# Patient Record
Sex: Female | Born: 1960 | Race: White | Hispanic: No | Marital: Married | Smoking: Former smoker
Health system: Southern US, Community
[De-identification: ages and names within clinical notes are randomized; demographics above are authoritative.]

## PROBLEM LIST (undated history)

## (undated) DIAGNOSIS — K621 Rectal polyp: Secondary | ICD-10-CM

## (undated) DIAGNOSIS — G43909 Migraine, unspecified, not intractable, without status migrainosus: Secondary | ICD-10-CM

## (undated) DIAGNOSIS — F329 Major depressive disorder, single episode, unspecified: Secondary | ICD-10-CM

## (undated) DIAGNOSIS — F32A Depression, unspecified: Secondary | ICD-10-CM

## (undated) DIAGNOSIS — I429 Cardiomyopathy, unspecified: Secondary | ICD-10-CM

## (undated) DIAGNOSIS — G2581 Restless legs syndrome: Secondary | ICD-10-CM

## (undated) DIAGNOSIS — F410 Panic disorder [episodic paroxysmal anxiety] without agoraphobia: Secondary | ICD-10-CM

## (undated) DIAGNOSIS — K589 Irritable bowel syndrome without diarrhea: Secondary | ICD-10-CM

## (undated) DIAGNOSIS — I341 Nonrheumatic mitral (valve) prolapse: Secondary | ICD-10-CM

## (undated) DIAGNOSIS — E785 Hyperlipidemia, unspecified: Secondary | ICD-10-CM

## (undated) DIAGNOSIS — F419 Anxiety disorder, unspecified: Secondary | ICD-10-CM

## (undated) HISTORY — DX: Restless legs syndrome: G25.81

## (undated) HISTORY — DX: Depression, unspecified: F32.A

## (undated) HISTORY — DX: Major depressive disorder, single episode, unspecified: F32.9

## (undated) HISTORY — DX: Hyperlipidemia, unspecified: E78.5

## (undated) HISTORY — DX: Migraine, unspecified, not intractable, without status migrainosus: G43.909

## (undated) HISTORY — DX: Irritable bowel syndrome, unspecified: K58.9

## (undated) HISTORY — DX: Panic disorder (episodic paroxysmal anxiety): F41.0

## (undated) HISTORY — DX: Cardiomyopathy, unspecified: I42.9

## (undated) HISTORY — DX: Rectal polyp: K62.1

## (undated) HISTORY — DX: Anxiety disorder, unspecified: F41.9

## (undated) HISTORY — DX: Nonrheumatic mitral (valve) prolapse: I34.1

## (undated) HISTORY — PX: RECTAL POLYPECTOMY: SHX2309

---

## 1985-10-26 HISTORY — PX: OTHER SURGICAL HISTORY: SHX169

## 1999-12-16 ENCOUNTER — Other Ambulatory Visit: Admission: RE | Admit: 1999-12-16 | Discharge: 1999-12-16 | Payer: Self-pay | Admitting: Family Medicine

## 2000-04-15 ENCOUNTER — Encounter: Payer: Self-pay | Admitting: Family Medicine

## 2000-04-15 ENCOUNTER — Encounter: Admission: RE | Admit: 2000-04-15 | Discharge: 2000-04-15 | Payer: Self-pay | Admitting: Family Medicine

## 2001-07-05 ENCOUNTER — Other Ambulatory Visit: Admission: RE | Admit: 2001-07-05 | Discharge: 2001-07-05 | Payer: Self-pay | Admitting: Family Medicine

## 2001-10-10 ENCOUNTER — Encounter: Payer: Self-pay | Admitting: Family Medicine

## 2001-10-10 ENCOUNTER — Encounter: Admission: RE | Admit: 2001-10-10 | Discharge: 2001-10-10 | Payer: Self-pay | Admitting: Family Medicine

## 2002-06-27 ENCOUNTER — Other Ambulatory Visit: Admission: RE | Admit: 2002-06-27 | Discharge: 2002-06-27 | Payer: Self-pay | Admitting: Family Medicine

## 2002-10-11 ENCOUNTER — Encounter: Admission: RE | Admit: 2002-10-11 | Discharge: 2002-10-11 | Payer: Self-pay | Admitting: Family Medicine

## 2002-10-11 ENCOUNTER — Encounter: Payer: Self-pay | Admitting: Family Medicine

## 2003-07-03 ENCOUNTER — Other Ambulatory Visit: Admission: RE | Admit: 2003-07-03 | Discharge: 2003-07-03 | Payer: Self-pay | Admitting: Family Medicine

## 2003-07-06 ENCOUNTER — Encounter: Admission: RE | Admit: 2003-07-06 | Discharge: 2003-07-06 | Payer: Self-pay | Admitting: Family Medicine

## 2003-07-06 ENCOUNTER — Encounter: Payer: Self-pay | Admitting: Family Medicine

## 2003-10-16 ENCOUNTER — Encounter: Admission: RE | Admit: 2003-10-16 | Discharge: 2003-10-16 | Payer: Self-pay | Admitting: Family Medicine

## 2003-12-29 ENCOUNTER — Emergency Department (HOSPITAL_COMMUNITY): Admission: EM | Admit: 2003-12-29 | Discharge: 2003-12-29 | Payer: Self-pay | Admitting: Emergency Medicine

## 2004-07-15 ENCOUNTER — Other Ambulatory Visit: Admission: RE | Admit: 2004-07-15 | Discharge: 2004-07-15 | Payer: Self-pay | Admitting: Family Medicine

## 2004-10-07 ENCOUNTER — Ambulatory Visit: Payer: Self-pay | Admitting: Family Medicine

## 2004-10-24 ENCOUNTER — Encounter: Admission: RE | Admit: 2004-10-24 | Discharge: 2004-10-24 | Payer: Self-pay | Admitting: Family Medicine

## 2005-01-13 ENCOUNTER — Ambulatory Visit: Payer: Self-pay | Admitting: Family Medicine

## 2005-02-10 ENCOUNTER — Ambulatory Visit: Payer: Self-pay | Admitting: Family Medicine

## 2005-02-25 ENCOUNTER — Ambulatory Visit: Payer: Self-pay | Admitting: Family Medicine

## 2005-07-20 ENCOUNTER — Ambulatory Visit: Payer: Self-pay | Admitting: Family Medicine

## 2005-07-27 ENCOUNTER — Encounter: Payer: Self-pay | Admitting: Family Medicine

## 2005-07-27 ENCOUNTER — Other Ambulatory Visit: Admission: RE | Admit: 2005-07-27 | Discharge: 2005-07-27 | Payer: Self-pay | Admitting: Family Medicine

## 2005-07-27 ENCOUNTER — Ambulatory Visit: Payer: Self-pay | Admitting: Family Medicine

## 2005-08-27 ENCOUNTER — Ambulatory Visit: Payer: Self-pay | Admitting: Family Medicine

## 2005-11-16 ENCOUNTER — Encounter: Admission: RE | Admit: 2005-11-16 | Discharge: 2005-11-16 | Payer: Self-pay | Admitting: Family Medicine

## 2006-02-08 ENCOUNTER — Ambulatory Visit: Payer: Self-pay | Admitting: Family Medicine

## 2006-08-02 ENCOUNTER — Ambulatory Visit: Payer: Self-pay | Admitting: Family Medicine

## 2006-08-09 ENCOUNTER — Other Ambulatory Visit: Admission: RE | Admit: 2006-08-09 | Discharge: 2006-08-09 | Payer: Self-pay | Admitting: Family Medicine

## 2006-08-09 ENCOUNTER — Ambulatory Visit: Payer: Self-pay | Admitting: Family Medicine

## 2006-08-09 ENCOUNTER — Encounter: Payer: Self-pay | Admitting: Family Medicine

## 2006-11-29 ENCOUNTER — Encounter: Admission: RE | Admit: 2006-11-29 | Discharge: 2006-11-29 | Payer: Self-pay | Admitting: Family Medicine

## 2006-12-13 ENCOUNTER — Encounter: Admission: RE | Admit: 2006-12-13 | Discharge: 2006-12-13 | Payer: Self-pay | Admitting: Family Medicine

## 2007-06-15 DIAGNOSIS — F329 Major depressive disorder, single episode, unspecified: Secondary | ICD-10-CM

## 2007-06-15 DIAGNOSIS — F32A Depression, unspecified: Secondary | ICD-10-CM | POA: Insufficient documentation

## 2007-08-08 ENCOUNTER — Ambulatory Visit: Payer: Self-pay | Admitting: Family Medicine

## 2007-08-08 LAB — CONVERTED CEMR LAB
Albumin: 3.9 g/dL (ref 3.5–5.2)
Alkaline Phosphatase: 37 units/L — ABNORMAL LOW (ref 39–117)
BUN: 11 mg/dL (ref 6–23)
Basophils Absolute: 0.1 10*3/uL (ref 0.0–0.1)
Bilirubin Urine: NEGATIVE
Blood in Urine, dipstick: NEGATIVE
Cholesterol: 215 mg/dL (ref 0–200)
Creatinine, Ser: 0.6 mg/dL (ref 0.4–1.2)
Direct LDL: 146.6 mg/dL
Eosinophils Absolute: 0.2 10*3/uL (ref 0.0–0.6)
GFR calc non Af Amer: 114 mL/min
Glucose, Urine, Semiquant: NEGATIVE
Hemoglobin: 12.8 g/dL (ref 12.0–15.0)
Ketones, urine, test strip: NEGATIVE
MCHC: 34.7 g/dL (ref 30.0–36.0)
MCV: 95.5 fL (ref 78.0–100.0)
Monocytes Absolute: 0.4 10*3/uL (ref 0.2–0.7)
Monocytes Relative: 6.7 % (ref 3.0–11.0)
Potassium: 4.2 meq/L (ref 3.5–5.1)
Protein, U semiquant: NEGATIVE
RBC: 3.86 M/uL — ABNORMAL LOW (ref 3.87–5.11)
RDW: 13.6 % (ref 11.5–14.6)
Specific Gravity, Urine: 1.02
TSH: 2.13 microintl units/mL (ref 0.35–5.50)
Total Bilirubin: 0.6 mg/dL (ref 0.3–1.2)
Total CHOL/HDL Ratio: 4.7
Triglycerides: 97 mg/dL (ref 0–149)
pH: 7

## 2007-08-15 ENCOUNTER — Encounter: Payer: Self-pay | Admitting: Family Medicine

## 2007-08-15 ENCOUNTER — Ambulatory Visit: Payer: Self-pay | Admitting: Family Medicine

## 2007-08-15 ENCOUNTER — Other Ambulatory Visit: Admission: RE | Admit: 2007-08-15 | Discharge: 2007-08-15 | Payer: Self-pay | Admitting: Family Medicine

## 2007-08-15 DIAGNOSIS — L509 Urticaria, unspecified: Secondary | ICD-10-CM | POA: Insufficient documentation

## 2007-11-25 ENCOUNTER — Telehealth: Payer: Self-pay | Admitting: Family Medicine

## 2007-12-27 ENCOUNTER — Encounter: Admission: RE | Admit: 2007-12-27 | Discharge: 2007-12-27 | Payer: Self-pay | Admitting: Family Medicine

## 2008-01-19 ENCOUNTER — Ambulatory Visit: Payer: Self-pay | Admitting: Family Medicine

## 2008-01-19 DIAGNOSIS — M549 Dorsalgia, unspecified: Secondary | ICD-10-CM | POA: Insufficient documentation

## 2008-08-02 ENCOUNTER — Encounter: Payer: Self-pay | Admitting: Family Medicine

## 2008-08-06 ENCOUNTER — Ambulatory Visit: Payer: Self-pay | Admitting: Family Medicine

## 2008-08-06 LAB — CONVERTED CEMR LAB
Albumin: 3.6 g/dL (ref 3.5–5.2)
Alkaline Phosphatase: 44 units/L (ref 39–117)
Bilirubin Urine: NEGATIVE
Bilirubin, Direct: 0.1 mg/dL (ref 0.0–0.3)
Calcium: 9.1 mg/dL (ref 8.4–10.5)
Cholesterol: 185 mg/dL (ref 0–200)
Eosinophils Absolute: 0.2 10*3/uL (ref 0.0–0.7)
GFR calc Af Amer: 98 mL/min
GFR calc non Af Amer: 81 mL/min
Glucose, Bld: 95 mg/dL (ref 70–99)
Glucose, Urine, Semiquant: NEGATIVE
HCT: 36 % (ref 36.0–46.0)
HDL: 37.1 mg/dL — ABNORMAL LOW (ref >39.0)
Hemoglobin: 12.4 g/dL (ref 12.0–15.0)
MCHC: 34.5 g/dL (ref 30.0–36.0)
MCV: 96.7 fL (ref 78.0–100.0)
Monocytes Absolute: 0.3 10*3/uL (ref 0.1–1.0)
Monocytes Relative: 6.5 % (ref 3.0–12.0)
Neutro Abs: 3.1 10*3/uL (ref 1.4–7.7)
Nitrite: NEGATIVE
Platelets: 161 10*3/uL (ref 150–400)
Potassium: 5.3 meq/L — ABNORMAL HIGH (ref 3.5–5.1)
RDW: 13.2 % (ref 11.5–14.6)
Sodium: 144 meq/L (ref 135–145)
Specific Gravity, Urine: 1.025
Total Protein: 6.9 g/dL (ref 6.0–8.3)
Triglycerides: 106 mg/dL (ref 0–149)
pH: 5.5

## 2008-08-13 ENCOUNTER — Other Ambulatory Visit: Admission: RE | Admit: 2008-08-13 | Discharge: 2008-08-13 | Payer: Self-pay | Admitting: Family Medicine

## 2008-08-13 ENCOUNTER — Encounter: Payer: Self-pay | Admitting: Family Medicine

## 2008-08-13 ENCOUNTER — Ambulatory Visit: Payer: Self-pay | Admitting: Family Medicine

## 2008-08-13 DIAGNOSIS — F17200 Nicotine dependence, unspecified, uncomplicated: Secondary | ICD-10-CM | POA: Insufficient documentation

## 2008-12-27 ENCOUNTER — Encounter: Admission: RE | Admit: 2008-12-27 | Discharge: 2008-12-27 | Payer: Self-pay | Admitting: Family Medicine

## 2009-08-19 ENCOUNTER — Ambulatory Visit: Payer: Self-pay | Admitting: Family Medicine

## 2009-08-19 LAB — CONVERTED CEMR LAB
BUN: 17 mg/dL (ref 6–23)
Basophils Absolute: 0 10*3/uL (ref 0.0–0.1)
Basophils Relative: 0.3 % (ref 0.0–3.0)
Bilirubin Urine: NEGATIVE
Bilirubin, Direct: 0 mg/dL (ref 0.0–0.3)
CO2: 29 meq/L (ref 19–32)
Calcium: 9.3 mg/dL (ref 8.4–10.5)
Chloride: 107 meq/L (ref 96–112)
Cholesterol: 254 mg/dL — ABNORMAL HIGH (ref 0–200)
Creatinine, Ser: 0.7 mg/dL (ref 0.4–1.2)
Direct LDL: 173.4 mg/dL
Eosinophils Absolute: 0.2 10*3/uL (ref 0.0–0.7)
Glucose, Bld: 93 mg/dL (ref 70–99)
Glucose, Urine, Semiquant: NEGATIVE
Ketones, urine, test strip: NEGATIVE
MCHC: 34.6 g/dL (ref 30.0–36.0)
MCV: 97.6 fL (ref 78.0–100.0)
Monocytes Absolute: 0.4 10*3/uL (ref 0.1–1.0)
Neutrophils Relative %: 63.2 % (ref 43.0–77.0)
Platelets: 185 10*3/uL (ref 150.0–400.0)
RDW: 13.6 % (ref 11.5–14.6)
Total Bilirubin: 0.9 mg/dL (ref 0.3–1.2)
Total Protein: 7.6 g/dL (ref 6.0–8.3)
Triglycerides: 67 mg/dL (ref 0.0–149.0)
pH: 7.5

## 2009-08-26 ENCOUNTER — Other Ambulatory Visit: Admission: RE | Admit: 2009-08-26 | Discharge: 2009-08-26 | Payer: Self-pay | Admitting: Family Medicine

## 2009-08-26 ENCOUNTER — Encounter: Payer: Self-pay | Admitting: Family Medicine

## 2009-08-26 ENCOUNTER — Ambulatory Visit: Payer: Self-pay | Admitting: Family Medicine

## 2009-09-16 ENCOUNTER — Telehealth: Payer: Self-pay | Admitting: Family Medicine

## 2009-10-28 ENCOUNTER — Encounter: Payer: Self-pay | Admitting: Family Medicine

## 2009-12-20 ENCOUNTER — Ambulatory Visit: Payer: Self-pay | Admitting: Family Medicine

## 2009-12-20 DIAGNOSIS — T148XXA Other injury of unspecified body region, initial encounter: Secondary | ICD-10-CM | POA: Insufficient documentation

## 2010-01-20 ENCOUNTER — Encounter: Admission: RE | Admit: 2010-01-20 | Discharge: 2010-01-20 | Payer: Self-pay | Admitting: Family Medicine

## 2010-09-01 ENCOUNTER — Ambulatory Visit: Payer: Self-pay | Admitting: Family Medicine

## 2010-09-01 LAB — CONVERTED CEMR LAB
BUN: 14 mg/dL (ref 6–23)
Basophils Absolute: 0 10*3/uL (ref 0.0–0.1)
Bilirubin Urine: NEGATIVE
Chloride: 107 meq/L (ref 96–112)
Direct LDL: 174.3 mg/dL
Eosinophils Absolute: 0.2 10*3/uL (ref 0.0–0.7)
Glucose, Bld: 85 mg/dL (ref 70–99)
Glucose, Urine, Semiquant: NEGATIVE
HCT: 36.6 % (ref 36.0–46.0)
HDL: 56.7 mg/dL (ref >39.00)
Lymphs Abs: 1.7 10*3/uL (ref 0.7–4.0)
MCV: 97.2 fL (ref 78.0–100.0)
Monocytes Absolute: 0.4 10*3/uL (ref 0.1–1.0)
Platelets: 197 10*3/uL (ref 150.0–400.0)
Potassium: 4.9 meq/L (ref 3.5–5.1)
RDW: 14.5 % (ref 11.5–14.6)
Specific Gravity, Urine: 1.015
TSH: 1.38 microintl units/mL (ref 0.35–5.50)
Total Bilirubin: 0.6 mg/dL (ref 0.3–1.2)
WBC Urine, dipstick: NEGATIVE
pH: 6

## 2010-09-08 ENCOUNTER — Ambulatory Visit: Payer: Self-pay | Admitting: Family Medicine

## 2010-09-08 ENCOUNTER — Encounter: Payer: Self-pay | Admitting: Family Medicine

## 2010-09-08 ENCOUNTER — Other Ambulatory Visit: Admission: RE | Admit: 2010-09-08 | Discharge: 2010-09-08 | Payer: Self-pay | Admitting: Family Medicine

## 2010-09-29 ENCOUNTER — Telehealth: Payer: Self-pay | Admitting: Family Medicine

## 2010-10-03 ENCOUNTER — Telehealth: Payer: Self-pay | Admitting: Family Medicine

## 2010-10-13 ENCOUNTER — Ambulatory Visit: Payer: Self-pay | Admitting: Family Medicine

## 2010-10-13 DIAGNOSIS — F410 Panic disorder [episodic paroxysmal anxiety] without agoraphobia: Secondary | ICD-10-CM | POA: Insufficient documentation

## 2010-11-16 ENCOUNTER — Encounter: Payer: Self-pay | Admitting: Family Medicine

## 2010-11-24 ENCOUNTER — Ambulatory Visit
Admission: RE | Admit: 2010-11-24 | Discharge: 2010-11-24 | Payer: Self-pay | Source: Home / Self Care | Attending: Family Medicine | Admitting: Family Medicine

## 2010-11-24 DIAGNOSIS — J45901 Unspecified asthma with (acute) exacerbation: Secondary | ICD-10-CM | POA: Insufficient documentation

## 2010-11-27 NOTE — Progress Notes (Signed)
Summary: REQUEST FOR RETURN CALL?  Phone Note Call from Patient   Caller: Patient Summary of Call: Pt called back to speak with Fleet Contras, CMA..... Adv she was returning call - can be reached at   [phone].  Initial call taken by: Debbra Riding,  October 03, 2010 11:42 AM  Follow-up for Phone Call        Phone Call Completed Follow-up by: Kern Reap CMA Duncan Dull),  October 03, 2010 12:33 PM

## 2010-11-27 NOTE — Miscellaneous (Signed)
Summary: Physical Therapy Progress Note/[facility]  Physical Therapy Progress Note/[facility]   Imported By: Maryln Gottron 11/05/2009 11:53:28  _____________________________________________________________________  External Attachment:    Type:   Image     Comment:   External Document

## 2010-11-27 NOTE — Progress Notes (Signed)
Summary: REQUEST FOR REFILL  Phone Note Call from Patient   Caller: Patient   [phone] Summary of Call: Pt adv that she doesn't want to take the med: Lorazepam any longer.... adv it caused her to have nightmares, and altered her mood in a bad way...Marland KitchenMarland Kitchen Pt would like to have a Rx for xanax sent to:  [facility] [address]  Initial call taken by: Debbra Riding,  September 29, 2010 10:25 AM  Follow-up for Phone Call        [facility] please call [facility]........Marland Kitchen Xanax now has a black box warning....... so you can l use small amounts for a maximum of two weeks.........Marland Kitchen we need to talk about other options Follow-up by: Roderick Pee MD,  September 29, 2010 10:35 AM  Additional Follow-up for Phone Call Additional follow up Details #1::        left message on machine for patient  Additional Follow-up by: Kern Reap CMA Duncan Dull),  September 29, 2010 2:26 PM

## 2010-11-27 NOTE — Assessment & Plan Note (Signed)
Summary: neck check discloration/njr   Vital Signs:  Patient profile:   50 year old female Menstrual status:  irregular Temp:     98.9 degrees F oral BP sitting:   110 / 60  (left arm) Cuff size:   regular  Vitals Entered By: Sid Falcon LPN (December 20, 2009 3:44 PM) CC: spot on neck X 3 days   History of Present Illness: Discoloration right upper chest wall anteriorly which she noticed Tuesday morning when she woke up. No injury. No associated pain. No other bruises noted.  No progression of bruising since first noted. No regular aspirin use. No new lesions since then.  No gum bleeding or any other type of abnl bleeding.  Allergies (verified): No Known Drug Allergies  Past History:  Past Medical History: Last updated: 08/15/2007 Hives Depression IBS panic attacks PMH reviewed for relevance  Review of Systems  The patient denies anorexia, fever, weight loss, hemoptysis, and hematuria.    Physical Exam  General:  Well-developed,well-nourished,in no acute distress; alert,appropriate and cooperative throughout examination Mouth:  Oral mucosa and oropharynx without lesions or exudates.  Teeth in good repair. Chest Wall:  right upper chest wall reveals 8 x 10 mm ecchymosis which is nontender. No other areas of ecchymosis noted Lungs:  Normal respiratory effort, chest expands symmetrically. Lungs are clear to auscultation, no crackles or wheezes. Heart:  Normal rate and regular rhythm. S1 and S2 normal without gallop, murmur, click, rub or other extra sounds. Skin:  scattered superficial telangiectasias ant chest wall.   Impression & Recommendations:  Problem # 1:  BRUISE (ICD-924.9) Nontraumatic but singular.  Reassurance given but instructed pt she would need CBC if any other nontraumatic bruises noted.  Complete Medication List: 1)  Prednisone 20 Mg Tabs (Prednisone) .... As needed 2)  Triamcinolone Acetonide 0.1 % Crea (Triamcinolone acetonide) .... Uad 3)   Adult Aspirin Ec Low Strength 81 Mg Tbec (Aspirin) 4)  Hydroxyzine Hcl 10 Mg Tabs (Hydroxyzine hcl) .... As needed 5)  Alprazolam 0.5 Mg Tabs (Alprazolam) .Marland Kitchen.. 1 tab once daily 6)  Prozac 40 Mg Caps (Fluoxetine hcl) .Marland Kitchen.. 1 tab @ bedtime 7)  Diclofenac Sodium 75 Mg Tbec (Diclofenac sodium) .... Take one tab two times a day 8)  Flexeril 10 Mg Tabs (Cyclobenzaprine hcl) .Marland Kitchen.. 1 tab @ bedtime 9)  Vicodin Es 7.5-750 Mg Tabs (Hydrocodone-acetaminophen) .Marland Kitchen.. 1 tab @ bedtime

## 2010-11-27 NOTE — Assessment & Plan Note (Signed)
Summary: cpx/pap/njr also flu shot/njr   Vital Signs:  Patient profile:   50 year old female Menstrual status:  irregular LMP:     08/30/2010 Height:      67.25 inches Weight:      141 pounds BMI:     22.00 Temp:     98.4 degrees F oral BP sitting:   112 / 80  (left arm) Cuff size:   regular  Vitals Entered By: Kern Reap CMA Duncan Dull) (September 08, 2010 10:47 AM) CC: cpx LMP (date): 08/30/2010     Enter LMP: 08/30/2010 Last PAP Result NEGATIVE FOR INTRAEPITHELIAL LESIONS OR MALIGNANCY.   CC:  cpx.  History of Present Illness:  Shaneque is a delightful 50 year old, married female........ unfortunately she continues to smoke about 6 cigarettes a day........ who comes in today for general physical examination because of underlying tobacco abuse, chronic back pain, anxiety, and depression.  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  She is able to get by with Motrin, 600 mg 3 times a day with food, and 10 mg of Flexeril once weekly, when her pain and muscle spasm or severe.  She's been through all the modalities, including physical therapy.  We discussed epidural steroid injections.  However, she states her insurance will not cover it.  She takes Prozac 40 mg daily would like to decrease the dose to 20.  She takes alprazolam .5 daily p.r.n..  I suggested she take a low dose Ativan once or twice daily as this would be better than smoking.  She also uses cortisone cream p.r.n. for eczema and hydroxyzine 10 mg p.r.n. for itching.  She gets routine eye care, dental care, BSE monthly, and a mammography, LMP November 5 to November, the 11th normal.  Beginning menopause with irregular periods and hot flashes.  Tetanus 2002 seasonal flu shot today  Allergies (verified): No Known Drug Allergies  Past History:  Past medical, surgical, family and social histories (including risk factors)  reviewed, and no changes noted (except as noted below).  Past Medical History: Reviewed history from 08/15/2007 and no changes required. Hives Depression IBS panic attacks  Past Surgical History: Reviewed history from 06/15/2007 and no changes required. CB x1  Family History: Reviewed history and no changes required.  Social History: Reviewed history from 01/19/2008 and no changes required. Occupation:food service Married Current Smoker Alcohol use-yes  Review of Systems      See HPI       Flu Vaccine Consent Questions     Do you have a history of severe allergic reactions to this vaccine? no    Any prior history of allergic reactions to egg and/or gelatin? no    Do you have a sensitivity to the preservative Thimersol? no    Do you have a past history of Guillan-Barre Syndrome? no    Do you currently have an acute febrile illness? no    Have you ever had a severe reaction to latex? no    Vaccine information given and explained to patient? yes    Are you currently pregnant? no    Lot Number:[id number]   Exp Date:04/25/2011   Site Given  Left Deltoid IM   Physical Exam  General:  Well-developed,well-nourished,in no acute distress; alert,appropriate and cooperative throughout examination Head:  Normocephalic and atraumatic without obvious abnormalities. No apparent alopecia or balding. Eyes:  No corneal or  conjunctival inflammation noted. EOMI. Perrla. Funduscopic exam benign, without hemorrhages, exudates or papilledema. Vision grossly normal. Ears:  External ear exam shows no significant lesions or deformities.  Otoscopic examination reveals clear canals, tympanic membranes are intact bilaterally without bulging, retraction, inflammation or discharge. Hearing is grossly normal bilaterally. Nose:  External nasal examination shows no deformity or inflammation. Nasal mucosa are pink and moist without lesions or exudates. Mouth:  Oral mucosa and oropharynx without lesions or  exudates.  Teeth in good repair. Neck:  No deformities, masses, or tenderness noted. Chest Wall:  No deformities, masses, or tenderness noted. Breasts:  left breast normal right breast shows an area of thickening 12 to 9 o'clock position.  Previously noted Lungs:  Normal respiratory effort, chest expands symmetrically. Lungs are clear to auscultation, no crackles or wheezes. Heart:  Normal rate and regular rhythm. S1 and S2 normal without gallop, murmur, click, rub or other extra sounds. Abdomen:  Bowel sounds positive,abdomen soft and non-tender without masses, organomegaly or hernias noted. Rectal:  No external abnormalities noted. Normal sphincter tone. No rectal masses or tenderness. Genitalia:  Pelvic Exam:        External: normal female genitalia without lesions or masses        Vagina: normal without lesions or masses        Cervix: normal without lesions or masses        Adnexa: normal bimanual exam without masses or fullness        Uterus: normal by palpation        Pap smear: performed Msk:  No deformity or scoliosis noted of thoracic or lumbar spine.   Pulses:  R and L carotid,radial,femoral,dorsalis pedis and posterior tibial pulses are full and equal bilaterally Extremities:  No clubbing, cyanosis, edema, or deformity noted with normal full range of motion of all joints.   Neurologic:  No cranial nerve deficits noted. Station and gait are normal. Plantar reflexes are down-going bilaterally. DTRs are symmetrical throughout. Sensory, motor and coordinative functions appear intact. Skin:  Intact without suspicious lesions or rashes Cervical Nodes:  No lymphadenopathy noted Axillary Nodes:  No palpable lymphadenopathy Inguinal Nodes:  No significant adenopathy Psych:  Cognition and judgment appear intact. Alert and cooperative with normal attention span and concentration. No apparent delusions, illusions, hallucinations   Impression & Recommendations:  Problem # 1:  TOBACCO ABUSE  (ICD-305.1) Assessment Unchanged  Orders: EKG w/ Interpretation (93000)  Problem # 2:  BACK PAIN (ICD-724.5) Assessment: Unchanged  The following medications were removed from the medication list:    Diclofenac Sodium 75 Mg Tbec (Diclofenac sodium) .Marland Kitchen... Take one tab two times a day    Vicodin Es 7.5-750 Mg Tabs (Hydrocodone-acetaminophen) .Marland Kitchen... 1 tab @ bedtime Her updated medication list for this problem includes:    Adult Aspirin Ec Low Strength 81 Mg Tbec (Aspirin)    Flexeril 10 Mg Tabs (Cyclobenzaprine hcl) .Marland Kitchen... 1 tab @ bedtime    Vicodin Es 7.5-750 Mg Tabs (Hydrocodone-acetaminophen) .Marland Kitchen... 1 tab @ bedtime as needed pain  Problem # 3:  PHYSICAL EXAMINATION (ICD-V70.0) Assessment: Unchanged  Orders: EKG w/ Interpretation (93000)  Problem # 4:  DEPRESSION (ICD-311) Assessment: Unchanged  The following medications were removed from the medication list:    Alprazolam 0.5 Mg Tabs (Alprazolam) .Marland Kitchen... 1 tab once daily    Prozac 40 Mg Caps (Fluoxetine hcl) .Marland Kitchen... 1 tab @ bedtime Her updated medication list for this problem includes:    Hydroxyzine Hcl 10 Mg Tabs (Hydroxyzine hcl) .Marland Kitchen... As  needed    Ativan 0.5 Mg Tabs (Lorazepam) .Marland Kitchen... 1/2 by mouth two times a day    Prozac 20 Mg Caps (Fluoxetine hcl) .Marland Kitchen... 1 tab @ bedtime  Complete Medication List: 1)  Prednisone 20 Mg Tabs (Prednisone) .... As needed 2)  Triamcinolone Acetonide 0.1 % Crea (Triamcinolone acetonide) .... Uad 3)  Adult Aspirin Ec Low Strength 81 Mg Tbec (Aspirin) 4)  Hydroxyzine Hcl 10 Mg Tabs (Hydroxyzine hcl) .... As needed 5)  Flexeril 10 Mg Tabs (Cyclobenzaprine hcl) .Marland Kitchen.. 1 tab @ bedtime 6)  Ativan 0.5 Mg Tabs (Lorazepam) .... 1/2 by mouth two times a day 7)  Vicodin Es 7.5-750 Mg Tabs (Hydrocodone-acetaminophen) .Marland Kitchen.. 1 tab @ bedtime as needed pain 8)  Prozac 20 Mg Caps (Fluoxetine hcl) .Marland Kitchen.. 1 tab @ bedtime  Other Orders: Admin 1st Vaccine (16109) Flu Vaccine 80yrs + (60454)  Patient Instructions: 1)   take .25 milligrams of Ativan twice daily and decrease the Prozac to 20 mg at bedtime.  Return in 4 weeks for follow-up. 2)  Decrease y  cigarette consumption by one per week. 3)  Schedule your mammogram. 4)  Take calcium +Vitamin D daily. 5)  Take an Aspirin every day...Marland Kitchen81 mg  Prescriptions: PROZAC 20 MG CAPS (FLUOXETINE HCL) 1 tab @ bedtime  #100 x 3   Entered and Authorized by:   Roderick Pee MD   Signed by:   Roderick Pee MD on 09/08/2010   Method used:   Print then Give to Patient   RxID:   [id number] HYDROXYZINE HCL 10 MG  TABS (HYDROXYZINE HCL) as needed  #100 x 3   Entered and Authorized by:   Roderick Pee MD   Signed by:   Roderick Pee MD on 09/08/2010   Method used:   Print then Give to Patient   RxID:   [id number] TRIAMCINOLONE ACETONIDE 0.1 %  CREA (TRIAMCINOLONE ACETONIDE) uad  #1 lbs jar x prn   Entered and Authorized by:   Roderick Pee MD   Signed by:   Roderick Pee MD on 09/08/2010   Method used:   Print then Give to Patient   RxID:   [id number] FLEXERIL 10 MG TABS (CYCLOBENZAPRINE HCL) 1 tab @ bedtime  #50 x 1   Entered and Authorized by:   Roderick Pee MD   Signed by:   Roderick Pee MD on 09/08/2010   Method used:   Print then Give to Patient   RxID:   [id number] VICODIN ES 7.5-750 MG TABS (HYDROCODONE-ACETAMINOPHEN) 1 tab @ bedtime as needed pain  #50 x 1   Entered and Authorized by:   Roderick Pee MD   Signed by:   Roderick Pee MD on 09/08/2010   Method used:   Print then Give to Patient   RxID:   [id number] ATIVAN 0.5 MG TABS (LORAZEPAM) 1/2 by mouth two times a day  #100 x 3   Entered and Authorized by:   Roderick Pee MD   Signed by:   Roderick Pee MD on 09/08/2010   Method used:   Print then Give to Patient   RxID:   [id number]    Orders Added: 1)  Est. Patient 40-64 years [99396] 2)  Admin 1st Vaccine [90471] 3)  Flu Vaccine 76yrs + [20254] 4)  EKG w/ Interpretation [93000]

## 2010-11-27 NOTE — Assessment & Plan Note (Signed)
Summary: follow up meds - rv   Vital Signs:  Patient profile:   50 year old female Menstrual status:  irregular Weight:      143 pounds Temp:     98.3 degrees F oral BP sitting:   120 / 80  (left arm) Cuff size:   regular  Vitals Entered By: Kern Reap CMA Duncan Dull) (October 13, 2010 10:53 AM) CC: follow-up visit   CC:  follow-up visit.  History of Present Illness: Felicia Good is a 50 year old, married female smoker, who comes in today for evaluation of anxiety attacks.  Her anxiety attacks seem to be getting worse.  She tried increasing her Prozac however, it didn't seem to work.  In the past.  She is taking Xanax however, I encouraged her not to take Xanax.  She is researched this and would like some Klonopin did also discuss with her taking a different SSRI to see if this was stopped.  Her panic attacks  Allergies: No Known Drug Allergies  Past History:  Past medical, surgical, family and social histories (including risk factors) reviewed for relevance to current acute and chronic problems.  Past Medical History: Reviewed history from 08/15/2007 and no changes required. Hives Depression IBS panic attacks  Past Surgical History: Reviewed history from 06/15/2007 and no changes required. CB x1  Family History: Reviewed history and no changes required.  Social History: Reviewed history from 01/19/2008 and no changes required. Occupation:food service Married Current Smoker Alcohol use-yes  Review of Systems      See HPI  Physical Exam  General:  Well-developed,well-nourished,in no acute distress; alert,appropriate and cooperative throughout examination Psych:  Cognition and judgment appear intact. Alert and cooperative with normal attention span and concentration. No apparent delusions, illusions, hallucinations   Problems:  Medical Problems Added: 1)  Dx of Panic Disorder,no Agoraphobia  (ICD-300.01)  Impression & Recommendations:  Problem # 1:  PANIC  DISORDER,NO AGORAPHOBIA (ICD-300.01) Assessment New  The following medications were removed from the medication list:    Ativan 0.5 Mg Tabs (Lorazepam) .Marland Kitchen... 1/2 by mouth two times a day    Prozac 20 Mg Caps (Fluoxetine hcl) .Marland Kitchen... 1 tab @ bedtime Her updated medication list for this problem includes:    Hydroxyzine Hcl 10 Mg Tabs (Hydroxyzine hcl) .Marland Kitchen... As needed    Celexa 20 Mg Tabs (Citalopram hydrobromide) .Marland Kitchen... 1 tab @ bedtime    Klonopin 0.5 Mg Tabs (Clonazepam) .Marland Kitchen... 1 tab by mouth as needed panic attack  Complete Medication List: 1)  Prednisone 20 Mg Tabs (Prednisone) .... As needed 2)  Triamcinolone Acetonide 0.1 % Crea (Triamcinolone acetonide) .... Uad 3)  Adult Aspirin Ec Low Strength 81 Mg Tbec (Aspirin) 4)  Hydroxyzine Hcl 10 Mg Tabs (Hydroxyzine hcl) .... As needed 5)  Flexeril 10 Mg Tabs (Cyclobenzaprine hcl) .Marland Kitchen.. 1 tab @ bedtime 6)  Vicodin Es 7.5-750 Mg Tabs (Hydrocodone-acetaminophen) .Marland Kitchen.. 1 tab @ bedtime as needed pain 7)  Celexa 20 Mg Tabs (Citalopram hydrobromide) .Marland Kitchen.. 1 tab @ bedtime 8)  Klonopin 0.5 Mg Tabs (Clonazepam) .Marland Kitchen.. 1 tab by mouth as needed panic attack  Patient Instructions: 1)  stopped the Prozac. 2)  Begin Celexa 20 mg a day at bedtime. 3)  If you have a breakthrough panic attack and you can take a  Klonopin.  Return in 6 weeks for follow-up, sooner if any problems Prescriptions: KLONOPIN 0.5 MG TABS (CLONAZEPAM) 1 tab by mouth as needed panic attack  #30 x 3   Entered and Authorized by:  Roderick Pee MD   Signed by:   Roderick Pee MD on 10/13/2010   Method used:   Print then Give to Patient   RxID:   [id number] CELEXA 20 MG TABS (CITALOPRAM HYDROBROMIDE) 1 tab @ bedtime  #100 x 3   Entered and Authorized by:   Roderick Pee MD   Signed by:   Roderick Pee MD on 10/13/2010   Method used:   Print then Give to Patient   RxID:   [id number]    Orders Added: 1)  Est. Patient Level III [66063]

## 2010-12-03 NOTE — Assessment & Plan Note (Signed)
Summary: 6 wk rov/njr   Vital Signs:  Patient profile:   50 year old female Menstrual status:  irregular Weight:      138 pounds Temp:     98.2 degrees F oral BP sitting:   110 / 80  (left arm) Cuff size:   regular  Vitals Entered By: Kern Reap CMA (AAMA) (November 24, 2010 10:03 AM) CC: 6 week follow up   CC:  6 week follow up.  History of Present Illness: Felicia Good is a 50 year old, married female, smoker, who comes in today for follow-up of depression, anxiety, and tobacco abuse, and a cough.  We started Celexa 20 mg daily in December.  She feels somewhat better, but not a lot.  She takes Klonopin .5 p.r.n. for panic attacks.  She cut her cigarette consumption down to one or two cigarettes per day.  She's tried all the chantix tobacco supplements etc. now that seems to help.  She said her cough for the past week or 10 days.  She had some oral prednisone at home and has been taking it feels better.  Still coughing  Allergies: No Known Drug Allergies  Past History:  Past medical, surgical, family and social histories (including risk factors) reviewed for relevance to current acute and chronic problems.  Past Medical History: Reviewed history from 08/15/2007 and no changes required. Hives Depression IBS panic attacks  Past Surgical History: Reviewed history from 06/15/2007 and no changes required. CB x1  Family History: Reviewed history and no changes required.  Social History: Reviewed history from 01/19/2008 and no changes required. Occupation:food service Married Current Smoker Alcohol use-yes  Review of Systems      See HPI  Physical Exam  General:  Well-developed,well-nourished,in no acute distress; alert,appropriate and cooperative throughout examination Chest Wall:  No deformities, masses, or tenderness noted. Lungs:  symmetrical breath sounds, late expiratory wheezing   Problems:  Medical Problems Added: 1)  Dx of Asthma, Acute   (ZOX-096.04)  Impression & Recommendations:  Problem # 1:  PANIC DISORDER,NO AGORAPHOBIA (ICD-300.01) Assessment Improved  The following medications were removed from the medication list:    Celexa 20 Mg Tabs (Citalopram hydrobromide) .Marland Kitchen... 1 tab @ bedtime Her updated medication list for this problem includes:    Hydroxyzine Hcl 10 Mg Tabs (Hydroxyzine hcl) .Marland Kitchen... As needed    Klonopin 0.5 Mg Tabs (Clonazepam) .Marland Kitchen... 1 tab @ bedtime    Celexa 40 Mg Tabs (Citalopram hydrobromide) .Marland Kitchen... 1 tab @ bedtime  Orders: Prescription Created Electronically [phone])  Problem # 2:  TOBACCO ABUSE (ICD-305.1) Assessment: Improved  Orders: Prescription Created Electronically [phone])  Problem # 3:  DEPRESSION (ICD-311) Assessment: Unchanged  The following medications were removed from the medication list:    Celexa 20 Mg Tabs (Citalopram hydrobromide) .Marland Kitchen... 1 tab @ bedtime Her updated medication list for this problem includes:    Hydroxyzine Hcl 10 Mg Tabs (Hydroxyzine hcl) .Marland Kitchen... As needed    Klonopin 0.5 Mg Tabs (Clonazepam) .Marland Kitchen... 1 tab @ bedtime    Celexa 40 Mg Tabs (Citalopram hydrobromide) .Marland Kitchen... 1 tab @ bedtime  Orders: Prescription Created Electronically [phone])  Problem # 4:  ASTHMA, ACUTE (AOZ-308.65) Assessment: New  Her updated medication list for this problem includes:    Prednisone 20 Mg Tabs (Prednisone) .Marland Kitchen... As needed  Orders: Prescription Created Electronically [phone])  Complete Medication List: 1)  Prednisone 20 Mg Tabs (Prednisone) .... As needed 2)  Triamcinolone Acetonide 0.1 % Crea (Triamcinolone acetonide) .... Uad 3)  Adult Aspirin Ec Low  Strength 81 Mg Tbec (Aspirin) 4)  Hydroxyzine Hcl 10 Mg Tabs (Hydroxyzine hcl) .... As needed 5)  Flexeril 10 Mg Tabs (Cyclobenzaprine hcl) .Marland Kitchen.. 1 tab @ bedtime 6)  Vicodin Es 7.5-750 Mg Tabs (Hydrocodone-acetaminophen) .Marland Kitchen.. 1 tab @ bedtime as needed pain 7)  Klonopin 0.5 Mg Tabs (Clonazepam) .Marland Kitchen.. 1 tab @ bedtime 8)  Celexa 40  Mg Tabs (Citalopram hydrobromide) .Marland Kitchen.. 1 tab @ bedtime  Patient Instructions: 1)  drink 30 ounces of water daily. 2)  No smoking at all. 3)  Prednisone one tablet daily, x 3 days, a half x 3 days, then half a tablet Monday, Wednesday, Friday, for a two week taper. 4)  Increase the Celexa to 40 mg a day at bedtime and take a Klonopin every night at bedtime. 5)  Follow-up in 6 weeks Prescriptions: PREDNISONE 20 MG TABS (PREDNISONE) as needed  #50 x 1   Entered and Authorized by:   Roderick Pee MD   Signed by:   Roderick Pee MD on 11/24/2010   Method used:   [facility]  [address] [phone] ([facility]ail[facility]       [address]       [place], [place]  [id number]       Ph: [phone] or [phone]       Fax: [phone]   RxID:   [id number] CELEXA 40 MG TABS (CITALOPRAM HYDROBROMIDE) 1 tab @ bedtime  #100 x 3   Entered and Authorized by:   Roderick Pee MD   Signed by:   Roderick Pee MD on 11/24/2010   Method used:   [facility]        [facility]  [address] [phone]* (retail)       [address]       [place], [place]  [id number]       Ph: [phone] or [phone]       Fax: [phone]   RxID:   [id number] KLONOPIN 0.5 MG TABS (CLONAZEPAM) 1 tab @ bedtime  #100 x 3   Entered and Authorized by:   Roderick Pee MD   Signed by:   Roderick Pee MD on 11/24/2010   Method used:   Print then Give to Patient   RxID:   [id number]    Orders Added: 1)  Prescription Created Electronically [G8553] 2)  Est. Patient Level IV [06269]

## 2011-01-09 ENCOUNTER — Ambulatory Visit: Payer: Self-pay | Admitting: Family Medicine

## 2011-03-03 ENCOUNTER — Other Ambulatory Visit: Payer: Self-pay | Admitting: Family Medicine

## 2011-03-03 DIAGNOSIS — Z1231 Encounter for screening mammogram for malignant neoplasm of breast: Secondary | ICD-10-CM

## 2011-03-30 ENCOUNTER — Ambulatory Visit
Admission: RE | Admit: 2011-03-30 | Discharge: 2011-03-30 | Disposition: A | Discharge status: Home / Self Care | Payer: 59 | Source: Ambulatory Visit | Attending: Family Medicine | Admitting: Family Medicine

## 2011-03-30 DIAGNOSIS — Z1231 Encounter for screening mammogram for malignant neoplasm of breast: Secondary | ICD-10-CM

## 2011-04-02 ENCOUNTER — Other Ambulatory Visit: Payer: Self-pay | Admitting: Family Medicine

## 2011-04-02 DIAGNOSIS — R928 Other abnormal and inconclusive findings on diagnostic imaging of breast: Secondary | ICD-10-CM

## 2011-04-09 ENCOUNTER — Ambulatory Visit
Admission: RE | Admit: 2011-04-09 | Discharge: 2011-04-09 | Disposition: A | Discharge status: Home / Self Care | Payer: 59 | Source: Ambulatory Visit | Attending: Family Medicine | Admitting: Family Medicine

## 2011-04-09 DIAGNOSIS — R928 Other abnormal and inconclusive findings on diagnostic imaging of breast: Secondary | ICD-10-CM

## 2011-05-28 ENCOUNTER — Other Ambulatory Visit: Payer: Self-pay | Admitting: *Deleted

## 2011-05-28 MED ORDER — CLONAZEPAM 0.5 MG PO TABS: 0.5000 mg | ORAL_TABLET | Freq: Every evening | ORAL | Status: DC | PRN

## 2011-09-02 ENCOUNTER — Ambulatory Visit (INDEPENDENT_AMBULATORY_CARE_PROVIDER_SITE_OTHER): Payer: 59 | Admitting: Family Medicine

## 2011-09-02 DIAGNOSIS — Z23 Encounter for immunization: Secondary | ICD-10-CM

## 2011-09-25 ENCOUNTER — Other Ambulatory Visit (INDEPENDENT_AMBULATORY_CARE_PROVIDER_SITE_OTHER): Payer: 59

## 2011-09-25 DIAGNOSIS — Z0000 Encounter for general adult medical examination without abnormal findings: Secondary | ICD-10-CM

## 2011-09-25 LAB — BASIC METABOLIC PANEL
BUN: 13 mg/dL (ref 6–23)
GFR: 105.53 mL/min (ref >60.00)
Potassium: 3.9 mEq/L (ref 3.5–5.1)
Sodium: 140 mEq/L (ref 135–145)

## 2011-09-25 LAB — LDL CHOLESTEROL, DIRECT: Direct LDL: 131.2 mg/dL

## 2011-09-25 LAB — CBC WITH DIFFERENTIAL/PLATELET
Eosinophils Relative: 2.5 % (ref 0.0–5.0)
HCT: 35.5 % — ABNORMAL LOW (ref 36.0–46.0)
Hemoglobin: 12 g/dL (ref 12.0–15.0)
Lymphs Abs: 1.6 10*3/uL (ref 0.7–4.0)
Monocytes Relative: 6.2 % (ref 3.0–12.0)
Platelets: 189 10*3/uL (ref 150.0–400.0)
WBC: 5.4 10*3/uL (ref 4.5–10.5)

## 2011-09-25 LAB — HEPATIC FUNCTION PANEL
ALT: 40 U/L — ABNORMAL HIGH (ref 0–35)
AST: 39 U/L — ABNORMAL HIGH (ref 0–37)
Alkaline Phosphatase: 41 U/L (ref 39–117)
Total Bilirubin: 0.3 mg/dL (ref 0.3–1.2)

## 2011-09-25 LAB — POCT URINALYSIS DIPSTICK
Glucose, UA: NEGATIVE
Nitrite, UA: NEGATIVE
Spec Grav, UA: 1.02
Urobilinogen, UA: 0.2

## 2011-09-25 LAB — LIPID PANEL
Total CHOL/HDL Ratio: 3
VLDL: 10.6 mg/dL (ref 0.0–40.0)

## 2011-09-25 LAB — TSH: TSH: 1.33 u[IU]/mL (ref 0.35–5.50)

## 2011-10-06 ENCOUNTER — Encounter: Payer: Self-pay | Admitting: Family Medicine

## 2011-10-06 ENCOUNTER — Ambulatory Visit (INDEPENDENT_AMBULATORY_CARE_PROVIDER_SITE_OTHER): Payer: 59 | Admitting: Family Medicine

## 2011-10-06 ENCOUNTER — Other Ambulatory Visit (HOSPITAL_COMMUNITY)
Admission: RE | Admit: 2011-10-06 | Discharge: 2011-10-06 | Disposition: A | Discharge status: Home / Self Care | Payer: 59 | Source: Ambulatory Visit | Attending: Family Medicine | Admitting: Family Medicine

## 2011-10-06 DIAGNOSIS — Z01419 Encounter for gynecological examination (general) (routine) without abnormal findings: Secondary | ICD-10-CM | POA: Insufficient documentation

## 2011-10-06 DIAGNOSIS — Z23 Encounter for immunization: Secondary | ICD-10-CM

## 2011-10-06 DIAGNOSIS — F17200 Nicotine dependence, unspecified, uncomplicated: Secondary | ICD-10-CM

## 2011-10-06 DIAGNOSIS — M549 Dorsalgia, unspecified: Secondary | ICD-10-CM

## 2011-10-06 DIAGNOSIS — F329 Major depressive disorder, single episode, unspecified: Secondary | ICD-10-CM

## 2011-10-06 DIAGNOSIS — Z0000 Encounter for general adult medical examination without abnormal findings: Secondary | ICD-10-CM

## 2011-10-06 DIAGNOSIS — L509 Urticaria, unspecified: Secondary | ICD-10-CM

## 2011-10-06 MED ORDER — CITALOPRAM HYDROBROMIDE 40 MG PO TABS: 40.0000 mg | ORAL_TABLET | Freq: Every day | ORAL | Status: DC

## 2011-10-06 MED ORDER — HYDROXYZINE HCL 10 MG PO TABS: 10.0000 mg | ORAL_TABLET | Freq: Three times a day (TID) | ORAL | Status: DC | PRN

## 2011-10-06 MED ORDER — CLONAZEPAM 0.5 MG PO TABS: 0.5000 mg | ORAL_TABLET | Freq: Every evening | ORAL | Status: DC | PRN

## 2011-10-06 MED ORDER — CYCLOBENZAPRINE HCL 10 MG PO TABS: 10.0000 mg | ORAL_TABLET | Freq: Three times a day (TID) | ORAL | Status: DC | PRN

## 2011-10-06 MED ORDER — HYDROCODONE-ACETAMINOPHEN 7.5-750 MG PO TABS: 1.0000 | ORAL_TABLET | Freq: Four times a day (QID) | ORAL | Status: DC | PRN

## 2011-10-06 MED ORDER — TRIAMCINOLONE ACETONIDE 0.1 % EX CREA: 1.0000 "application " | TOPICAL_CREAM | Freq: Two times a day (BID) | CUTANEOUS | Status: DC
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
## 2011-10-06 NOTE — Patient Instructions (Signed)
Continued ear current medications.  We will set you up a consult with Dr. Modesto Charon for complete neurologic evaluation  Stop smoking completely.  Return one year, sooner if any problems.  Remember to do a complete breast exam monthly and come see [place] if you have any question between checkups

## 2011-10-07 ENCOUNTER — Encounter: Payer: Self-pay | Admitting: Neurology

## 2011-11-18 ENCOUNTER — Ambulatory Visit: Payer: 59 | Admitting: Neurology

## 2012-04-18 ENCOUNTER — Ambulatory Visit (INDEPENDENT_AMBULATORY_CARE_PROVIDER_SITE_OTHER): Payer: 59 | Admitting: Family Medicine

## 2012-04-18 ENCOUNTER — Encounter: Payer: Self-pay | Admitting: Family Medicine

## 2012-04-18 VITALS — Temp 97.9°F | Wt 135.0 lb

## 2012-04-18 DIAGNOSIS — L820 Inflamed seborrheic keratosis: Secondary | ICD-10-CM

## 2012-04-18 DIAGNOSIS — J309 Allergic rhinitis, unspecified: Secondary | ICD-10-CM

## 2012-04-18 DIAGNOSIS — Z720 Tobacco use: Secondary | ICD-10-CM

## 2012-04-18 DIAGNOSIS — L989 Disorder of the skin and subcutaneous tissue, unspecified: Secondary | ICD-10-CM

## 2012-04-18 MED ORDER — FLUTICASONE PROPIONATE 50 MCG/ACT NA SUSP: NASAL | Status: DC

## 2012-04-18 MED ORDER — VARENICLINE TARTRATE 1 MG PO TABS: ORAL_TABLET | ORAL | Status: DC

## 2012-04-18 NOTE — Patient Instructions (Addendum)
Within 2 weeks we will call you the report  Chantix program,,,,,,,,,, 1 mg tablet,,,,,,,,,, one half tablet daily in the morning  Plain Zyrtec and one shot of the steroid nasal spray up each nostril bedtime

## 2012-04-18 NOTE — Progress Notes (Signed)
  Subjective:    Patient ID: Felicia Good, female    DOB: 27-Jan-1961, 51 y.o.   MRN: [id number]  HPI Felicia Good is a 51 year old married female nonsmoker who comes in today for evaluation of a nonhealing lesion on her right ear  She's had a nonhealing lesion on her right ear for about 6 months. She did get a lot of sun exposure she grew up farm general and dermatologic   Review of Systems General and dermatologic review of systems otherwise negative    Objective:   Physical Exam  Well-developed well nourished female no acute distress examination of the right ear shows a 8 mm x 8 mm irritated lesion total body skin exam otherwise normal  After informed consent the lesion was anesthetized with 1% Xylocaine plain. It was excised with 2 mm margins the base was cauterized pressure dressing was applied she tolerated the procedure no complications lesion sent for pathologic analysis clinically it appears to be an irritated seborrheic keratosis      Assessment & Plan:  Irritated

## 2012-06-15 ENCOUNTER — Other Ambulatory Visit: Payer: Self-pay | Admitting: Family Medicine

## 2012-06-15 DIAGNOSIS — Z1231 Encounter for screening mammogram for malignant neoplasm of breast: Secondary | ICD-10-CM

## 2012-06-21 ENCOUNTER — Encounter: Payer: Self-pay | Admitting: Family Medicine

## 2012-06-21 ENCOUNTER — Ambulatory Visit (INDEPENDENT_AMBULATORY_CARE_PROVIDER_SITE_OTHER): Payer: 59 | Admitting: Family Medicine

## 2012-06-21 VITALS — BP 110/78 | Temp 98.0°F | Wt 131.0 lb

## 2012-06-21 DIAGNOSIS — B029 Zoster without complications: Secondary | ICD-10-CM

## 2012-06-21 MED ORDER — ACYCLOVIR 400 MG PO TABS: ORAL_TABLET | ORAL | Status: DC

## 2012-06-21 MED ORDER — TRAMADOL HCL 50 MG PO TABS: 50.0000 mg | ORAL_TABLET | Freq: Three times a day (TID) | ORAL | Status: AC | PRN

## 2012-06-21 NOTE — Progress Notes (Signed)
  Subjective:    Patient ID: Felicia Good, female    DOB: 1961-06-15, 51 y.o.   MRN: [id number]  HPI Felicia Good is a 51 year old female who comes in today for evaluation of a skin rash  Last Tuesday she noticed a red painful area on the back of her neck just behind her ear. Over the next couple days it spread and she has numerous areas over her back and shoulders. The pain is a 2 on a scale of 1-10   Review of Systems General and dermatologic review of systems otherwise negative    Objective:   Physical Exam Well-developed well-nourished female no acute distress examination of skin shows a rash consistent with shingles       Assessment & Plan:

## 2012-06-21 NOTE — Patient Instructions (Signed)
Acyclovir,,,,,,,,,, 2 tabs 3 times daily until rash is completely gone  Tramadol 1/2-1 tablet 3 times daily as needed for pain  Return when necessary

## 2012-06-22 ENCOUNTER — Ambulatory Visit: Payer: 59 | Admitting: Family Medicine

## 2012-06-24 ENCOUNTER — Telehealth: Payer: Self-pay | Admitting: Family Medicine

## 2012-06-24 NOTE — Telephone Encounter (Signed)
Caller: Felicia Good/Patient; Patient Name: Felicia Good; PCP: Kelle Darting [facility]); Best Callback Phone Number: [phone] seen in office on 06/21/12 and dx with Shingles on R side of neck- onset 06/13/12. Rash has blistered and dried up and she has questions about contagiousness. Information given per [facility] on Shingles.  She is cleansing with witch hazel and using vanilla extract on bites and itching is minimal. Using Zoviraz as directed. Triage offered and refused. Information Only Protocol.

## 2012-07-04 ENCOUNTER — Ambulatory Visit
Admission: RE | Admit: 2012-07-04 | Discharge: 2012-07-04 | Disposition: A | Discharge status: Home / Self Care | Payer: 59 | Source: Ambulatory Visit | Attending: Family Medicine | Admitting: Family Medicine

## 2012-07-04 DIAGNOSIS — Z1231 Encounter for screening mammogram for malignant neoplasm of breast: Secondary | ICD-10-CM

## 2012-07-06 ENCOUNTER — Other Ambulatory Visit: Payer: Self-pay | Admitting: Family Medicine

## 2012-07-06 DIAGNOSIS — R928 Other abnormal and inconclusive findings on diagnostic imaging of breast: Secondary | ICD-10-CM

## 2012-07-07 ENCOUNTER — Ambulatory Visit (INDEPENDENT_AMBULATORY_CARE_PROVIDER_SITE_OTHER): Payer: 59

## 2012-07-07 DIAGNOSIS — Z23 Encounter for immunization: Secondary | ICD-10-CM

## 2012-07-08 ENCOUNTER — Telehealth: Payer: Self-pay | Admitting: Family Medicine

## 2012-07-08 NOTE — Telephone Encounter (Signed)
Pt called and just wanted to make Dr Tawanna Cooler aware that she found the cyst, that was previously discussed.

## 2012-07-11 ENCOUNTER — Ambulatory Visit
Admission: RE | Admit: 2012-07-11 | Discharge: 2012-07-11 | Disposition: A | Discharge status: Home / Self Care | Payer: 59 | Source: Ambulatory Visit | Attending: Family Medicine | Admitting: Family Medicine

## 2012-07-11 DIAGNOSIS — R928 Other abnormal and inconclusive findings on diagnostic imaging of breast: Secondary | ICD-10-CM

## 2012-11-15 ENCOUNTER — Other Ambulatory Visit (INDEPENDENT_AMBULATORY_CARE_PROVIDER_SITE_OTHER): Payer: BC Managed Care – PPO

## 2012-11-15 DIAGNOSIS — Z0000 Encounter for general adult medical examination without abnormal findings: Secondary | ICD-10-CM

## 2012-11-15 LAB — CBC WITH DIFFERENTIAL/PLATELET
Basophils Absolute: 0 10*3/uL (ref 0.0–0.1)
Basophils Relative: 0.7 % (ref 0.0–3.0)
Eosinophils Relative: 3.2 % (ref 0.0–5.0)
HCT: 38.1 % (ref 36.0–46.0)
Hemoglobin: 12.9 g/dL (ref 12.0–15.0)
Lymphs Abs: 1.3 10*3/uL (ref 0.7–4.0)
Monocytes Relative: 7 % (ref 3.0–12.0)
Neutro Abs: 3.3 10*3/uL (ref 1.4–7.7)
RBC: 4.01 Mil/uL (ref 3.87–5.11)
RDW: 14.4 % (ref 11.5–14.6)

## 2012-11-15 LAB — LDL CHOLESTEROL, DIRECT: Direct LDL: 128.3 mg/dL

## 2012-11-15 LAB — LIPID PANEL
Total CHOL/HDL Ratio: 4
VLDL: 12.2 mg/dL (ref 0.0–40.0)

## 2012-11-15 LAB — POCT URINALYSIS DIPSTICK
Glucose, UA: NEGATIVE
Leukocytes, UA: NEGATIVE
Nitrite, UA: NEGATIVE
Protein, UA: NEGATIVE
Urobilinogen, UA: 0.2

## 2012-11-15 LAB — TSH: TSH: 1.1 u[IU]/mL (ref 0.35–5.50)

## 2012-11-15 LAB — BASIC METABOLIC PANEL
GFR: 113.33 mL/min (ref >60.00)
Glucose, Bld: 91 mg/dL (ref 70–99)
Potassium: 4 mEq/L (ref 3.5–5.1)
Sodium: 136 mEq/L (ref 135–145)

## 2012-11-15 LAB — HEPATIC FUNCTION PANEL
Albumin: 3.9 g/dL (ref 3.5–5.2)
Total Bilirubin: 0.4 mg/dL (ref 0.3–1.2)

## 2012-11-22 ENCOUNTER — Ambulatory Visit (INDEPENDENT_AMBULATORY_CARE_PROVIDER_SITE_OTHER): Payer: BC Managed Care – PPO | Admitting: Family Medicine

## 2012-11-22 ENCOUNTER — Other Ambulatory Visit (HOSPITAL_COMMUNITY)
Admission: RE | Admit: 2012-11-22 | Discharge: 2012-11-22 | Disposition: A | Discharge status: Home / Self Care | Payer: BC Managed Care – PPO | Source: Ambulatory Visit | Attending: Family Medicine | Admitting: Family Medicine

## 2012-11-22 ENCOUNTER — Encounter: Payer: Self-pay | Admitting: Family Medicine

## 2012-11-22 VITALS — BP 116/80 | Temp 97.9°F | Ht 67.75 in | Wt 139.0 lb

## 2012-11-22 DIAGNOSIS — M549 Dorsalgia, unspecified: Secondary | ICD-10-CM

## 2012-11-22 DIAGNOSIS — F329 Major depressive disorder, single episode, unspecified: Secondary | ICD-10-CM

## 2012-11-22 DIAGNOSIS — F17200 Nicotine dependence, unspecified, uncomplicated: Secondary | ICD-10-CM

## 2012-11-22 DIAGNOSIS — F410 Panic disorder [episodic paroxysmal anxiety] without agoraphobia: Secondary | ICD-10-CM

## 2012-11-22 DIAGNOSIS — Z01419 Encounter for gynecological examination (general) (routine) without abnormal findings: Secondary | ICD-10-CM | POA: Insufficient documentation

## 2012-11-22 DIAGNOSIS — J309 Allergic rhinitis, unspecified: Secondary | ICD-10-CM

## 2012-11-22 DIAGNOSIS — L509 Urticaria, unspecified: Secondary | ICD-10-CM

## 2012-11-22 DIAGNOSIS — Z0000 Encounter for general adult medical examination without abnormal findings: Secondary | ICD-10-CM

## 2012-11-22 MED ORDER — CITALOPRAM HYDROBROMIDE 40 MG PO TABS: 40.0000 mg | ORAL_TABLET | Freq: Every day | ORAL | Status: DC

## 2012-11-22 MED ORDER — FLUTICASONE PROPIONATE 50 MCG/ACT NA SUSP: NASAL | Status: DC

## 2012-11-22 MED ORDER — CYCLOBENZAPRINE HCL 10 MG PO TABS: 10.0000 mg | ORAL_TABLET | Freq: Three times a day (TID) | ORAL | Status: DC | PRN

## 2012-11-22 MED ORDER — CLONAZEPAM 0.5 MG PO TABS: 0.5000 mg | ORAL_TABLET | Freq: Every evening | ORAL | Status: DC | PRN

## 2012-11-22 NOTE — Patient Instructions (Addendum)
Continue your current medications  Congratulations on not smoking anymore  Return in one year for general physical examination sooner if any problems  Remember to do a thorough breast exam monthly on your birthday

## 2012-11-22 NOTE — Progress Notes (Signed)
  Subjective:    Patient ID: Felicia Good, female    DOB: Sep 23, 1961, 52 y.o.   MRN: [id number]  HPI Is a 52 year old married female X. smoker x2-1/2 months,,,,,, still uses seat E. Cigarettes,,,, who comes in today for general physical examination  She takes Celexa 40 mg each bedtime for mild depression and Klonopin 0.5 each bedtime.  She has a history of lumbar disease and occasionally will will take Flexeril and Vicodin. Uses about twice year when she has a severe bout of back pain.  She uses Flonase nasal spray for allergic rhinitis and Kenalog cream for eczema. She also has a history of urticaria etiology unknown she takes prednisone when necessary once or twice year when she has a flare.  LMP was in October. She's still having monthlies soreness in her breasts but no.  She gets routine eye care, dental care.  She's never had a colonoscopy    Review of Systems  Constitutional: Negative.   HENT: Negative.   Eyes: Negative.   Respiratory: Negative.   Cardiovascular: Negative.   Gastrointestinal: Negative.   Genitourinary: Negative.   Musculoskeletal: Negative.   Neurological: Negative.   Hematological: Negative.   Psychiatric/Behavioral: Negative.        Objective:   Physical Exam  Constitutional: She appears well-developed and well-nourished.  HENT:  Head: Normocephalic and atraumatic.  Right Ear: External ear normal.  Left Ear: External ear normal.  Nose: Nose normal.  Mouth/Throat: Oropharynx is clear and moist.  Eyes: EOM are normal. Pupils are equal, round, and reactive to light.  Neck: Normal range of motion. Neck supple. No thyromegaly present.  Cardiovascular: Normal rate, regular rhythm, normal heart sounds and intact distal pulses.  Exam reveals no gallop and no friction rub.   No murmur heard. Pulmonary/Chest: Effort normal and breath sounds normal.  Abdominal: Soft. Bowel sounds are normal. She exhibits no distension and no mass. There is no tenderness.  There is no rebound.  Genitourinary: Vagina normal and uterus normal. Guaiac negative stool. No vaginal discharge found.       Grade 1 cystocele she does her exercises twice daily no urinary leakage  Bilateral breast exam normal except for multiple fibrocystic changes. Left breast from 12 to 3:00 right breast is more diffuse recent mammogram normal  Musculoskeletal: Normal range of motion.  Lymphadenopathy:    She has no cervical adenopathy.  Neurological: She is alert. She has normal reflexes. No cranial nerve deficit. She exhibits normal muscle tone. Coordination normal.  Skin: Skin is warm and dry.  Psychiatric: She has a normal mood and affect. Her behavior is normal. Judgment and thought content normal.          Assessment & Plan:  Depression continue Celexa and Klonopin when necessary  Allergic rhinitis continue Flonase  Lumbar disc disease Flexeril and Vicodin when necessary  Idiopathic urticaria prednisone when necessary  Eczema triamcinolone cream when necessary  X. smoker x2 months  Perimenopausal LMP October 2013

## 2013-05-11 ENCOUNTER — Ambulatory Visit (INDEPENDENT_AMBULATORY_CARE_PROVIDER_SITE_OTHER): Payer: BC Managed Care – PPO | Admitting: Family Medicine

## 2013-05-11 ENCOUNTER — Encounter: Payer: Self-pay | Admitting: Family Medicine

## 2013-05-11 VITALS — BP 110/80

## 2013-05-11 DIAGNOSIS — R079 Chest pain, unspecified: Secondary | ICD-10-CM

## 2013-05-11 DIAGNOSIS — F410 Panic disorder [episodic paroxysmal anxiety] without agoraphobia: Secondary | ICD-10-CM

## 2013-05-11 DIAGNOSIS — K2100 Gastro-esophageal reflux disease with esophagitis, without bleeding: Secondary | ICD-10-CM

## 2013-05-11 DIAGNOSIS — F17200 Nicotine dependence, unspecified, uncomplicated: Secondary | ICD-10-CM

## 2013-05-11 MED ORDER — CLONAZEPAM 0.5 MG PO TABS: 0.5000 mg | ORAL_TABLET | Freq: Every evening | ORAL | Status: DC | PRN

## 2013-05-11 MED ORDER — CHLORDIAZEPOXIDE HCL 10 MG PO CAPS: 10.0000 mg | ORAL_CAPSULE | Freq: Three times a day (TID) | ORAL | Status: DC | PRN

## 2013-05-11 NOTE — Patient Instructions (Addendum)
A full liquid diet no solid food for the next 72 hours  Over-the-counter omeprazole one twice daily  No caffeine nicotine or alcohol  Do not have anything to eat or drink for 3 hours prior to bedtime  We're trying to get you set up to see one of the GI folks tomorrow  Once we get the GI problem resolve then I think we ought to do a cardiac stress test to be sure your heart is normal in the meantime if he has severe difficulty swallowing or you think foods getting stuck we have anymore exertional chest pain come directly to the emergency room for immediate evaluation

## 2013-05-11 NOTE — Progress Notes (Signed)
  Subjective:    Patient ID: Felicia Good, female    DOB: April 25, 1961, 52 y.o.   MRN: [id number]  HPI Felicia Good is a 52 year old female smoker who comes in today for evaluation of actually 2 problems  For the past 5 weeks she's had more reflux. When she eats and lies down right away she gets reflux of acid in the esophagus. She started taking Zantac 75 mg twice a day 2 weeks ago that didn't seem to help so she increased the dose 250 mg twice a day. Despite that she is now having symptoms of difficulty swallowing with food getting stuck in the upper third of her esophagus.  She also relates a history of mid upper sternal burning chest pain with exertion. She has been a heavy smoker. No underlying diabetes or lipid abnormality  Family history mom and dad are still living in their 34s  She states the upper midsternal burning pain is related to exertion but she doesn't any shortness of breath or diaphoresis sometimes you have to stop sometimes she will.   Review of Systems Review of systems otherwise negative except she recently got fired from her job she is interviewing for a new job at [employer]    Objective:   Physical Exam  Well-developed well-nourished female no acute distress cardiopulmonary exam negative abdominal exam negative EKG,,,,,,,,,,,,shows a sinus bradycardia no ST-T wave changes      Assessment & Plan:  Her symptoms are consistent with blockage of her upper third of her esophagus secondary to chronic reflux which is probably secondary to chronic tobacco abuse. Plan soft diet omeprazole twice a day lots of liquids no smoking GI consult ASAP  Atypical chest pain,,,,,,,,, history of a smoker,,, once the GI problem is resolved I would recommend that she have a cardiac stress test

## 2013-05-12 ENCOUNTER — Ambulatory Visit (INDEPENDENT_AMBULATORY_CARE_PROVIDER_SITE_OTHER): Payer: BC Managed Care – PPO | Admitting: Physician Assistant

## 2013-05-12 ENCOUNTER — Encounter: Payer: Self-pay | Admitting: Physician Assistant

## 2013-05-12 VITALS — BP 114/64 | HR 88 | Ht 66.5 in | Wt 139.2 lb

## 2013-05-12 DIAGNOSIS — K209 Esophagitis, unspecified without bleeding: Secondary | ICD-10-CM

## 2013-05-12 NOTE — Progress Notes (Signed)
Subjective:    Patient ID: Felicia Good, female    DOB: [birth year], 52 y.o.   MRN: [id number]  HPI  Felicia Good is a pleasant 52 year old white female who is referred today by Dr. Tawanna Cooler  for evaluation of chest pain and heartburn. Patient relates intermittent problems with heartburn and indigestion over the past few years usually relieved by TUMS or Rolaids. He says she would have episodes that would recur and if this happened she would take Zantac 75 mg daily for a week or so and her symptoms would resolve. She has now had problems over the past 4-5 weeks and has been taking Zantac twice a day and says for the most part if she would eat early in the day.each much she would do fine. She had an episode 2 days ago with bad indigestion which caused pain in her chest radiating to both of her shoulders and was present for several hours. She said this started up after she drank a few sips of coffee early in the morning immediately developed a burning sensation in her esophagus she has not had any dysphagia or odynophagia since she had one episode of nausea and vomiting. She started taking omeprazole 40 mg by mouth daily yesterday and says she is felt okay today. She has no complaints of abdominal pain changes in appetite bowel habits etc. Interestingly she had taken a course of prednisone recently and has been weaning herself off slowly currently taking one half tablet every 3 days. She says she gets hives intermittently and frequent as an IV in the summer and prednisone is the only thing that we'll cleared up. She's not had any recent NSAIDs or antibiotics     Review of Systems  Constitutional: Negative.   HENT: Negative.   Eyes: Negative.   Respiratory: Negative.   Cardiovascular: Positive for chest pain.  Gastrointestinal: Negative.   Endocrine: Negative.   Genitourinary: Negative.   Musculoskeletal: Negative.   Skin: Negative.   Allergic/Immunologic: Negative.   Neurological: Negative.   Hematological:  Negative.   Psychiatric/Behavioral: Negative.    Outpatient Prescriptions Prior to Visit  Medication Sig Dispense Refill  . chlordiazePOXIDE (LIBRIUM) 10 MG capsule Take 1 capsule (10 mg total) by mouth 3 (three) times daily as needed for anxiety.  100 capsule  3  . citalopram (CELEXA) 40 MG tablet Take 1 tablet (40 mg total) by mouth daily.  100 tablet  3  . clonazePAM (KLONOPIN) 0.5 MG tablet Take 1 tablet (0.5 mg total) by mouth at bedtime as needed.  100 tablet  3  . cyclobenzaprine (FLEXERIL) 10 MG tablet Take 1 tablet (10 mg total) by mouth 3 (three) times daily as needed.  30 tablet  3  . HYDROcodone-acetaminophen (VICODIN ES) 7.5-750 MG per tablet Take 1 tablet by mouth every 6 (six) hours as needed.  30 tablet  2  . predniSONE (DELTASONE) 20 MG tablet Take 20 mg by mouth daily.        Marland Kitchen triamcinolone cream (KENALOG) 0.1 % Apply 1 application topically 2 (two) times daily.  30 g  3  . fluticasone (FLONASE) 50 MCG/ACT nasal spray 1 spray up each nostril at bedtime  16 g  6   No facility-administered medications prior to visit.   No Known Allergies Patient Active Problem List   Diagnosis Date Noted  . PANIC DISORDER,NO AGORAPHOBIA 10/13/2010  . BRUISE 12/20/2009  . TOBACCO ABUSE 08/13/2008  . BACK PAIN 01/19/2008  . URTICARIA NOS 08/15/2007  . DEPRESSION 06/15/2007  History  Substance Use Topics  . Smoking status: Current Every Day Smoker -- 0.50 packs/day for 35 years    Types: Cigarettes  . Smokeless tobacco: Never Used  . Alcohol Use: No      family history includes Breast cancer in her maternal aunt and Other in her father.  Objective:   Physical Exam  well-developed white female in no acute distress, pleasant blood pressure 114/64 pulse 88 height 5 foot 6 weight 139 . HEENT; nontraumatic normocephalic EOMI PERRLA sclera anicteric, Supple; no JVD, Cardiovascular ;regular rate and rhythm with S1-S2 no murmur or gallop, Pulmonary; clear bilaterally, Abdomen ;soft  nontender nondistended bowel sounds are active there is no palpable mass or hepatosplenomegaly., Rectal; exam not done, Ext; no clubbing cyanosis or edema skin warm and dry, Psych ;mood and affect normal and appropriate.        Assessment & Plan:  #61  52 year old female with an episode of acute esophagitis likely exacerbated dated by prednisone and currently improving on omeprazole. She has no complaints of dysphagia or odynophagia currently. #2 chronic intermittent GERD symptoms #3 depression and panic disorder  Plan; discussed upper endoscopy with patient she is not interested in pursuing that currently We'll continue omeprazole 40 mg by mouth twice daily for 2 weeks then decrease to once daily for 2 weeks and stopping go back to Zantac as needed. She is advised that if she continues to have recurrent episodes of esophagitis she should have upper endoscopy. Reviewed antireflux regimen and she was also given reading material She is encouraged to come off of the prednisone and try to avoid prednisone over the next several weeks 2 follow up with Dr. Juanda Chance on an as-needed basis

## 2013-05-12 NOTE — Progress Notes (Signed)
reviewde and agree, also consider Candida esophagitis related to recent Prednisone. May benefit from Empirical Diflucan 100mg , #3, 1 po qg

## 2013-05-12 NOTE — Patient Instructions (Addendum)
Today you have been given handouts to read and follow on esophagitis and a GERD diet.  Take your omeprazole 40mg  as follows:  One tablet twice daily for 2 weeks, then one tablet every day for 2 weeks and then you may try to return to taking the Zantac daily.  Avoid prednisone and anti-inflammatories .  Advance to a bland diet.  Follow up with [place] an needed.   I appreciate the opportunity to care for you.

## 2013-06-19 ENCOUNTER — Other Ambulatory Visit: Payer: Self-pay

## 2013-06-19 DIAGNOSIS — Z1231 Encounter for screening mammogram for malignant neoplasm of breast: Secondary | ICD-10-CM

## 2013-07-05 ENCOUNTER — Ambulatory Visit
Admission: RE | Admit: 2013-07-05 | Discharge: 2013-07-05 | Disposition: A | Discharge status: Home / Self Care | Payer: BC Managed Care – PPO | Source: Ambulatory Visit

## 2013-07-05 DIAGNOSIS — Z1231 Encounter for screening mammogram for malignant neoplasm of breast: Secondary | ICD-10-CM

## 2013-07-11 ENCOUNTER — Other Ambulatory Visit: Payer: Self-pay | Admitting: Family Medicine

## 2013-07-11 DIAGNOSIS — R928 Other abnormal and inconclusive findings on diagnostic imaging of breast: Secondary | ICD-10-CM

## 2013-07-14 ENCOUNTER — Ambulatory Visit
Admission: RE | Admit: 2013-07-14 | Discharge: 2013-07-14 | Disposition: A | Discharge status: Home / Self Care | Payer: BC Managed Care – PPO | Source: Ambulatory Visit | Attending: Family Medicine | Admitting: Family Medicine

## 2013-07-14 DIAGNOSIS — R928 Other abnormal and inconclusive findings on diagnostic imaging of breast: Secondary | ICD-10-CM

## 2013-08-11 ENCOUNTER — Ambulatory Visit (INDEPENDENT_AMBULATORY_CARE_PROVIDER_SITE_OTHER): Payer: BC Managed Care – PPO

## 2013-08-11 DIAGNOSIS — Z23 Encounter for immunization: Secondary | ICD-10-CM

## 2014-01-22 ENCOUNTER — Other Ambulatory Visit: Payer: BC Managed Care – PPO

## 2014-01-22 ENCOUNTER — Other Ambulatory Visit (INDEPENDENT_AMBULATORY_CARE_PROVIDER_SITE_OTHER): Payer: BC Managed Care – PPO

## 2014-01-22 DIAGNOSIS — Z0000 Encounter for general adult medical examination without abnormal findings: Secondary | ICD-10-CM

## 2014-01-22 LAB — HEPATIC FUNCTION PANEL
ALT: 22 U/L (ref 0–35)
AST: 21 U/L (ref 0–37)
Albumin: 4.2 g/dL (ref 3.5–5.2)
Alkaline Phosphatase: 47 U/L (ref 39–117)
BILIRUBIN TOTAL: 0.8 mg/dL (ref 0.3–1.2)
Bilirubin, Direct: 0 mg/dL (ref 0.0–0.3)
Total Protein: 7.1 g/dL (ref 6.0–8.3)

## 2014-01-22 LAB — POCT URINALYSIS DIPSTICK
BILIRUBIN UA: NEGATIVE
Blood, UA: NEGATIVE
GLUCOSE UA: NEGATIVE
Ketones, UA: NEGATIVE
LEUKOCYTES UA: NEGATIVE
NITRITE UA: NEGATIVE
Protein, UA: NEGATIVE
Spec Grav, UA: 1.015
Urobilinogen, UA: 0.2
pH, UA: 6

## 2014-01-22 LAB — BASIC METABOLIC PANEL
BUN: 14 mg/dL (ref 6–23)
CALCIUM: 9.3 mg/dL (ref 8.4–10.5)
CO2: 28 mEq/L (ref 19–32)
CREATININE: 0.7 mg/dL (ref 0.4–1.2)
Chloride: 103 mEq/L (ref 96–112)
GFR: 92.62 mL/min (ref >60.00)
GLUCOSE: 105 mg/dL — AB (ref 70–99)
POTASSIUM: 3.7 meq/L (ref 3.5–5.1)
Sodium: 137 mEq/L (ref 135–145)

## 2014-01-22 LAB — LIPID PANEL
CHOLESTEROL: 231 mg/dL — AB (ref 0–200)
HDL: 61.1 mg/dL (ref >39.00)
LDL Cholesterol: 156 mg/dL — ABNORMAL HIGH (ref 0–99)
TRIGLYCERIDES: 70 mg/dL (ref 0.0–149.0)
Total CHOL/HDL Ratio: 4
VLDL: 14 mg/dL (ref 0.0–40.0)

## 2014-01-22 LAB — TSH: TSH: 3.35 u[IU]/mL (ref 0.35–5.50)

## 2014-01-23 LAB — CBC WITH DIFFERENTIAL/PLATELET
BASOS ABS: 0 10*3/uL (ref 0.0–0.1)
Basophils Relative: 0.4 % (ref 0.0–3.0)
Eosinophils Absolute: 0.2 10*3/uL (ref 0.0–0.7)
Eosinophils Relative: 3.9 % (ref 0.0–5.0)
HEMATOCRIT: 38.5 % (ref 36.0–46.0)
Hemoglobin: 12.9 g/dL (ref 12.0–15.0)
LYMPHS ABS: 1.7 10*3/uL (ref 0.7–4.0)
Lymphocytes Relative: 29.5 % (ref 12.0–46.0)
MCHC: 33.6 g/dL (ref 30.0–36.0)
MCV: 96.8 fl (ref 78.0–100.0)
MONO ABS: 0.3 10*3/uL (ref 0.1–1.0)
MONOS PCT: 6 % (ref 3.0–12.0)
Neutro Abs: 3.4 10*3/uL (ref 1.4–7.7)
Neutrophils Relative %: 60.2 % (ref 43.0–77.0)
PLATELETS: 184 10*3/uL (ref 150.0–400.0)
RBC: 3.97 Mil/uL (ref 3.87–5.11)
RDW: 14.5 % (ref 11.5–14.6)
WBC: 5.7 10*3/uL (ref 4.5–10.5)

## 2014-01-29 ENCOUNTER — Encounter: Payer: Self-pay | Admitting: Family Medicine

## 2014-01-29 ENCOUNTER — Ambulatory Visit (INDEPENDENT_AMBULATORY_CARE_PROVIDER_SITE_OTHER): Payer: BC Managed Care – PPO | Admitting: Family Medicine

## 2014-01-29 VITALS — BP 110/80 | Temp 98.1°F | Ht 67.5 in | Wt 142.0 lb

## 2014-01-29 DIAGNOSIS — F3289 Other specified depressive episodes: Secondary | ICD-10-CM

## 2014-01-29 DIAGNOSIS — F17200 Nicotine dependence, unspecified, uncomplicated: Secondary | ICD-10-CM

## 2014-01-29 DIAGNOSIS — F329 Major depressive disorder, single episode, unspecified: Secondary | ICD-10-CM

## 2014-01-29 DIAGNOSIS — M549 Dorsalgia, unspecified: Secondary | ICD-10-CM

## 2014-01-29 DIAGNOSIS — F410 Panic disorder [episodic paroxysmal anxiety] without agoraphobia: Secondary | ICD-10-CM

## 2014-01-29 DIAGNOSIS — Z0000 Encounter for general adult medical examination without abnormal findings: Secondary | ICD-10-CM

## 2014-01-29 MED ORDER — CLONAZEPAM 0.5 MG PO TABS: 0.5000 mg | ORAL_TABLET | Freq: Every evening | ORAL | Status: DC | PRN

## 2014-01-29 MED ORDER — TRIAMCINOLONE ACETONIDE 0.1 % EX CREA: 1.0000 "application " | TOPICAL_CREAM | Freq: Two times a day (BID) | CUTANEOUS | Status: DC

## 2014-01-29 MED ORDER — CHLORDIAZEPOXIDE HCL 10 MG PO CAPS: ORAL_CAPSULE | ORAL | Status: DC

## 2014-01-29 MED ORDER — CITALOPRAM HYDROBROMIDE 40 MG PO TABS: 40.0000 mg | ORAL_TABLET | Freq: Every day | ORAL | Status: DC

## 2014-01-29 MED ORDER — CYCLOBENZAPRINE HCL 10 MG PO TABS: 10.0000 mg | ORAL_TABLET | Freq: Three times a day (TID) | ORAL | Status: DC | PRN

## 2014-01-29 MED ORDER — FLUTICASONE PROPIONATE 50 MCG/ACT NA SUSP: 1.0000 | NASAL | Status: DC | PRN

## 2014-01-29 MED ORDER — PREDNISONE 20 MG PO TABS: 20.0000 mg | ORAL_TABLET | Freq: Every day | ORAL | Status: DC

## 2014-01-29 NOTE — Progress Notes (Signed)
   Subjective:    Patient ID: Felicia Good, female    DOB: 1961-03-25, 53 y.o.   MRN: [id number]  HPI Felicia Good is a 53 year old married female smoker.... 8 cigarettes a day, she quit on a program of tapering and Librium however since her parents now live with her her stress levels gone up and she started smoking at.Marland Kitchen[employer] Kitchen[employer] Kitchen who comes in today for general physical examination  She takes Celexa 40 mg daily at bedtime because of a history of depression and panic attacks. She also takes Klonopin 0.5 each bedtime when necessary  She has chronic back pain for which she takes Flexeril 10 mg 3 times daily when necessary and Vicodin 7.5 mg one half to one tablet each bedtime when necessary  Uses a steroid nasal spray  She is troubled poison ivy and takes prednisone 20 mg when necessary when she has a flare. She also uses Kenalog cream when necessary for eczema  She saw her. Her family over however she had a period in February 2015  She gets routine eye care, dental care, BSE monthly, and you mammography, Mr. colonoscopy   Review of Systems  Constitutional: Negative.   HENT: Negative.   Eyes: Negative.   Respiratory: Negative.   Cardiovascular: Negative.   Gastrointestinal: Negative.   Genitourinary: Negative.   Musculoskeletal: Negative.   Neurological: Negative.   Psychiatric/Behavioral: Negative.        Objective:   Physical Exam  Nursing note and vitals reviewed. Constitutional: She appears well-developed and well-nourished.  HENT:  Head: Normocephalic and atraumatic.  Right Ear: External ear normal.  Left Ear: External ear normal.  Nose: Nose normal.  Mouth/Throat: Oropharynx is clear and moist.  Eyes: EOM are normal. Pupils are equal, round, and reactive to light.  Neck: Normal range of motion. Neck supple. No thyromegaly present.  Cardiovascular: Normal rate, regular rhythm, normal heart sounds and intact distal pulses.  Exam reveals no gallop and no friction rub.   No murmur  heard. Pulmonary/Chest: Effort normal and breath sounds normal.  Abdominal: Soft. Bowel sounds are normal. She exhibits no distension and no mass. There is no tenderness. There is no rebound.  Genitourinary:  Bilateral breast exam normal  Pelvic last year normal Paps all been normal on a three-year cycle  Musculoskeletal: Normal range of motion.  Lymphadenopathy:    She has no cervical adenopathy.  Neurological: She is alert. She has normal reflexes. No cranial nerve deficit. She exhibits normal muscle tone. Coordination normal.  Skin: Skin is warm and dry.  Psychiatric: She has a normal mood and affect. Her behavior is normal. Judgment and thought content normal.          Assessment & Plan:  Healthy female  Tobacco abuse restart Librium 10 mg twice a day taper as outlined  History of panic attack continue Celexa 40 each bedtime  Sleep dysfunction Klonopin each bedtime when necessary  Chronic back pain Flexeril 10 mg 3 times a day when necessary along with Vicodin one half tab each bedtime  Allergic rhinitis continue steroid nasal spray  Eczema continue Kenalog ointment

## 2014-01-29 NOTE — Patient Instructions (Signed)
6 Librium 10 mg one twice daily  Taper off the cigarettes by decreasing 2 per week  Continue Celexa at bedtime  Klonopin 0.5 each bedtime when necessary  Flexeril and Vicodin when necessary for back pain flareups  Flonase for allergic rhinitis  Prednisone when necessary for poison ivy and  Kenalog twice a day when necessary for eczema

## 2014-01-29 NOTE — Progress Notes (Signed)
Pre visit review using our clinic review tool, if applicable. No additional management support is needed unless otherwise documented below in the visit note. 

## 2014-01-30 ENCOUNTER — Telehealth: Payer: Self-pay | Admitting: Family Medicine

## 2014-01-30 NOTE — Telephone Encounter (Signed)
Relevant patient education mailed to patient.  

## 2014-06-19 ENCOUNTER — Ambulatory Visit (INDEPENDENT_AMBULATORY_CARE_PROVIDER_SITE_OTHER): Payer: BC Managed Care – PPO | Admitting: *Deleted

## 2014-06-19 DIAGNOSIS — Z23 Encounter for immunization: Secondary | ICD-10-CM

## 2014-07-18 ENCOUNTER — Other Ambulatory Visit: Payer: Self-pay

## 2014-07-18 DIAGNOSIS — Z1231 Encounter for screening mammogram for malignant neoplasm of breast: Secondary | ICD-10-CM

## 2014-07-26 ENCOUNTER — Ambulatory Visit
Admission: RE | Admit: 2014-07-26 | Discharge: 2014-07-26 | Disposition: A | Discharge status: Home / Self Care | Payer: BC Managed Care – PPO | Source: Ambulatory Visit

## 2014-07-26 DIAGNOSIS — Z1231 Encounter for screening mammogram for malignant neoplasm of breast: Secondary | ICD-10-CM

## 2015-01-07 ENCOUNTER — Encounter: Payer: Self-pay | Admitting: Gastroenterology

## 2015-02-14 ENCOUNTER — Telehealth: Payer: Self-pay | Admitting: Family Medicine

## 2015-02-14 DIAGNOSIS — F17200 Nicotine dependence, unspecified, uncomplicated: Secondary | ICD-10-CM

## 2015-02-14 DIAGNOSIS — T7589XA Other specified effects of external causes, initial encounter: Secondary | ICD-10-CM

## 2015-02-14 DIAGNOSIS — J45901 Unspecified asthma with (acute) exacerbation: Secondary | ICD-10-CM

## 2015-02-14 DIAGNOSIS — Z0000 Encounter for general adult medical examination without abnormal findings: Secondary | ICD-10-CM
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
## 2015-02-14 NOTE — Telephone Encounter (Signed)
Labs ordered please schedule patient. Thanks.

## 2015-02-14 NOTE — Telephone Encounter (Signed)
Pt is aware labs has been ordered

## 2015-02-25 ENCOUNTER — Ambulatory Visit (AMBULATORY_SURGERY_CENTER): Payer: Self-pay | Admitting: *Deleted

## 2015-02-25 VITALS — Ht 67.5 in | Wt 145.0 lb

## 2015-02-25 DIAGNOSIS — Z1211 Encounter for screening for malignant neoplasm of colon: Secondary | ICD-10-CM

## 2015-02-25 NOTE — Progress Notes (Signed)
Patient denies any allergies to eggs or soy. Patient denies any past surgery with anesthesia. Patient denies any oxygen use at home and does not take any diet/weight loss medications. EMMI education assisgned to patient on colonoscopy, this was explained and instructions given to patient.

## 2015-02-28 ENCOUNTER — Encounter: Payer: Self-pay | Admitting: Internal Medicine

## 2015-03-06 ENCOUNTER — Encounter: Payer: Self-pay | Admitting: Internal Medicine

## 2015-03-06 ENCOUNTER — Ambulatory Visit (AMBULATORY_SURGERY_CENTER): Payer: BLUE CROSS/BLUE SHIELD | Admitting: Internal Medicine

## 2015-03-06 VITALS — BP 96/63 | HR 58 | Temp 96.7°F | Resp 18 | Ht 67.0 in | Wt 145.0 lb

## 2015-03-06 DIAGNOSIS — Z1211 Encounter for screening for malignant neoplasm of colon: Secondary | ICD-10-CM | POA: Diagnosis present

## 2015-03-06 MED ORDER — SODIUM CHLORIDE 0.9 % IV SOLN: 500.0000 mL | INTRAVENOUS | Status: DC

## 2015-03-06 NOTE — Op Note (Signed)
[facility]  [address]. [place], [address]   COLONOSCOPY PROCEDURE REPORT  PATIENT: Felicia Good, Felicia Good  MR#: [id number] BIRTHDATE: 29-Aug-1961 , 32  yrs. old GENDER: female ENDOSCOPIST: Lafayette Dragon, MD REFERRED [id number] Delora Fuel, M.D. PROCEDURE DATE:  03/06/2015 PROCEDURE:   Colonoscopy, screening First Screening Colonoscopy - Avg.  risk and is 50 yrs.  old or older Yes.  Prior Negative Screening - Now for repeat screening. N/A  History of Adenoma - Now for follow-up colonoscopy & has been > or = to 3 yrs.  N/A ASA CLASS:   Class I INDICATIONS:Screening for colonic neoplasia and Colorectal Neoplasm Risk Assessment for this procedure is average risk. MEDICATIONS: Monitored anesthesia care and Propofol 200 mg IV  DESCRIPTION OF PROCEDURE:   After the risks benefits and alternatives of the procedure were thoroughly explained, informed consent was obtained.  The digital rectal exam revealed no abnormalities of the rectum.   The LB [id number]  endoscope was introduced through the anus and advanced to the cecum, which was identified by both the appendix and ileocecal valve. No adverse events experienced.   The quality of the prep was excellent. (MoviPrep was used)  The instrument was then slowly withdrawn as the colon was fully examined.      COLON FINDINGS: A normal appearing cecum, ileocecal valve, and appendiceal orifice were identified.  The ascending, transverse, descending, sigmoid colon, and rectum appeared unremarkable. Retroflexed views revealed no abnormalities. The time to cecum = 4.50 Withdrawal time = 6.50   The scope was withdrawn and the procedure completed. COMPLICATIONS: There were no immediate complications.  ENDOSCOPIC IMPRESSION: Normal colonoscopy  RECOMMENDATIONS: High-fiber diet Recall colonoscopy in 10 years  eSigned:  Lafayette Dragon, MD 03/06/2015 12:48 PM   cc:

## 2015-03-06 NOTE — Patient Instructions (Signed)
Discharge instructions given. Normal exam. Resume previous medications. YOU HAD AN ENDOSCOPIC PROCEDURE TODAY AT THE [facility]:   Refer to the procedure report that was given to you for any specific questions about what was found during the examination.  If the procedure report does not answer your questions, please call your gastroenterologist to clarify.  If you requested that your care partner not be given the details of your procedure findings, then the procedure report has been included in a sealed envelope for you to review at your convenience later.  YOU SHOULD EXPECT: Some feelings of bloating in the abdomen. Passage of more gas than usual.  Walking can help get rid of the air that was put into your GI tract during the procedure and reduce the bloating. If you had a lower endoscopy (such as a colonoscopy or flexible sigmoidoscopy) you may notice spotting of blood in your stool or on the toilet paper. If you underwent a bowel prep for your procedure, you may not have a normal bowel movement for a few days.  Please Note:  You might notice some irritation and congestion in your nose or some drainage.  This is from the oxygen used during your procedure.  There is no need for concern and it should clear up in a day or so.  SYMPTOMS TO REPORT IMMEDIATELY:   Following lower endoscopy (colonoscopy or flexible sigmoidoscopy):  Excessive amounts of blood in the stool  Significant tenderness or worsening of abdominal pains  Swelling of the abdomen that is new, acute  Fever of 100F or higher   For urgent or emergent issues, a gastroenterologist can be reached at any hour by calling [phone].   DIET: Your first meal following the procedure should be a small meal and then it is ok to progress to your normal diet. Heavy or fried foods are harder to digest and may make you feel nauseous or bloated.  Likewise, meals heavy in dairy and vegetables can increase bloating.  Drink plenty  of fluids but you should avoid alcoholic beverages for 24 hours.  ACTIVITY:  You should plan to take it easy for the rest of today and you should NOT DRIVE or use heavy machinery until tomorrow (because of the sedation medicines used during the test).    FOLLOW UP: Our staff will call the number listed on your records the next business day following your procedure to check on you and address any questions or concerns that you may have regarding the information given to you following your procedure. If we do not reach you, we will leave a message.  However, if you are feeling well and you are not experiencing any problems, there is no need to return our call.  We will assume that you have returned to your regular daily activities without incident.  If any biopsies were taken you will be contacted by phone or by letter within the next 1-3 weeks.  Please call us at [phone] if you have not heard about the biopsies in 3 weeks.    SIGNATURES/CONFIDENTIALITY: You and/or your care partner have signed paperwork which will be entered into your electronic medical record.  These signatures attest to the fact that that the information above on your After Visit Summary has been reviewed and is understood.  Full responsibility of the confidentiality of this discharge information lies with you and/or your care-partner. 

## 2015-03-06 NOTE — Progress Notes (Signed)
A/ox3, pleased with MAC, report to RN 

## 2015-03-07 ENCOUNTER — Telehealth: Payer: Self-pay | Admitting: *Deleted

## 2015-03-07 NOTE — Telephone Encounter (Signed)
  Follow up Call-  Call back number 03/06/2015  Post procedure Call Back phone  # [phone]  Permission to leave phone message Yes     Patient questions:  Do you have a fever, pain , or abdominal swelling? No. Pain Score  0 *  Have you tolerated food without any problems? Yes.    Have you been able to return to your normal activities? Yes.    Do you have any questions about your discharge instructions: Diet   No. Medications  No. Follow up visit  No.  Do you have questions or concerns about your Care? No.  Actions: * If pain score is 4 or above: No action needed, pain <4.

## 2015-03-11 ENCOUNTER — Encounter: Payer: Self-pay | Admitting: Gastroenterology

## 2015-03-23 ENCOUNTER — Other Ambulatory Visit: Payer: Self-pay | Admitting: Family Medicine

## 2015-03-27 ENCOUNTER — Other Ambulatory Visit: Payer: Self-pay | Admitting: Family Medicine

## 2015-07-02 ENCOUNTER — Other Ambulatory Visit (INDEPENDENT_AMBULATORY_CARE_PROVIDER_SITE_OTHER): Payer: BLUE CROSS/BLUE SHIELD

## 2015-07-02 DIAGNOSIS — Z720 Tobacco use: Secondary | ICD-10-CM | POA: Diagnosis not present

## 2015-07-02 DIAGNOSIS — J45901 Unspecified asthma with (acute) exacerbation: Secondary | ICD-10-CM

## 2015-07-02 DIAGNOSIS — Z0000 Encounter for general adult medical examination without abnormal findings: Secondary | ICD-10-CM

## 2015-07-02 DIAGNOSIS — F17200 Nicotine dependence, unspecified, uncomplicated: Secondary | ICD-10-CM

## 2015-07-02 DIAGNOSIS — T7589XA Other specified effects of external causes, initial encounter: Secondary | ICD-10-CM

## 2015-07-02 LAB — BASIC METABOLIC PANEL
BUN: 18 mg/dL (ref 6–23)
CO2: 29 mEq/L (ref 19–32)
Calcium: 9.6 mg/dL (ref 8.4–10.5)
Chloride: 106 mEq/L (ref 96–112)
Creatinine, Ser: 0.7 mg/dL (ref 0.40–1.20)
GFR: 92.13 mL/min (ref >60.00)
Glucose, Bld: 92 mg/dL (ref 70–99)
POTASSIUM: 4.2 meq/L (ref 3.5–5.1)
Sodium: 142 mEq/L (ref 135–145)

## 2015-07-02 LAB — POCT URINALYSIS DIPSTICK
Bilirubin, UA: NEGATIVE
Blood, UA: NEGATIVE
GLUCOSE UA: NEGATIVE
Ketones, UA: NEGATIVE
NITRITE UA: NEGATIVE
Protein, UA: NEGATIVE
Spec Grav, UA: >=1.03
UROBILINOGEN UA: 0.2
pH, UA: 6

## 2015-07-02 LAB — CBC WITH DIFFERENTIAL/PLATELET
Basophils Absolute: 0 10*3/uL (ref 0.0–0.1)
Basophils Relative: 0.5 % (ref 0.0–3.0)
EOS PCT: 2.8 % (ref 0.0–5.0)
Eosinophils Absolute: 0.2 10*3/uL (ref 0.0–0.7)
HCT: 38.4 % (ref 36.0–46.0)
HEMOGLOBIN: 13 g/dL (ref 12.0–15.0)
LYMPHS PCT: 30.2 % (ref 12.0–46.0)
Lymphs Abs: 1.8 10*3/uL (ref 0.7–4.0)
MCHC: 33.9 g/dL (ref 30.0–36.0)
MCV: 94.8 fl (ref 78.0–100.0)
MONOS PCT: 5.8 % (ref 3.0–12.0)
Monocytes Absolute: 0.4 10*3/uL (ref 0.1–1.0)
Neutro Abs: 3.7 10*3/uL (ref 1.4–7.7)
Neutrophils Relative %: 60.7 % (ref 43.0–77.0)
Platelets: 190 10*3/uL (ref 150.0–400.0)
RBC: 4.05 Mil/uL (ref 3.87–5.11)
RDW: 13.7 % (ref 11.5–15.5)
WBC: 6.1 10*3/uL (ref 4.0–10.5)

## 2015-07-02 LAB — HEPATIC FUNCTION PANEL
ALBUMIN: 4.3 g/dL (ref 3.5–5.2)
ALK PHOS: 59 U/L (ref 39–117)
ALT: 22 U/L (ref 0–35)
AST: 20 U/L (ref 0–37)
Bilirubin, Direct: 0 mg/dL (ref 0.0–0.3)
Total Bilirubin: 0.4 mg/dL (ref 0.2–1.2)
Total Protein: 7.1 g/dL (ref 6.0–8.3)

## 2015-07-02 LAB — LIPID PANEL
Cholesterol: 235 mg/dL — ABNORMAL HIGH (ref 0–200)
HDL: 51.7 mg/dL (ref >39.00)
LDL CALC: 160 mg/dL — AB (ref 0–99)
NonHDL: 183.22
Total CHOL/HDL Ratio: 5
Triglycerides: 117 mg/dL (ref 0.0–149.0)
VLDL: 23.4 mg/dL (ref 0.0–40.0)

## 2015-07-02 LAB — TSH: TSH: 1.9 u[IU]/mL (ref 0.35–4.50)

## 2015-07-03 LAB — HEPATITIS PANEL, ACUTE
HCV Ab: NEGATIVE
HEP A IGM: NONREACTIVE
HEP B S AG: NEGATIVE
Hep B C IgM: NONREACTIVE

## 2015-07-09 ENCOUNTER — Ambulatory Visit (INDEPENDENT_AMBULATORY_CARE_PROVIDER_SITE_OTHER): Payer: BLUE CROSS/BLUE SHIELD | Admitting: Family Medicine

## 2015-07-09 ENCOUNTER — Encounter: Payer: Self-pay | Admitting: Family Medicine

## 2015-07-09 VITALS — BP 110/80 | Temp 98.0°F | Ht 67.0 in | Wt 140.0 lb

## 2015-07-09 DIAGNOSIS — F410 Panic disorder [episodic paroxysmal anxiety] without agoraphobia: Secondary | ICD-10-CM

## 2015-07-09 DIAGNOSIS — Z720 Tobacco use: Secondary | ICD-10-CM

## 2015-07-09 DIAGNOSIS — F329 Major depressive disorder, single episode, unspecified: Secondary | ICD-10-CM | POA: Diagnosis not present

## 2015-07-09 DIAGNOSIS — Z23 Encounter for immunization: Secondary | ICD-10-CM | POA: Diagnosis not present

## 2015-07-09 DIAGNOSIS — F32A Depression, unspecified: Secondary | ICD-10-CM

## 2015-07-09 DIAGNOSIS — F17200 Nicotine dependence, unspecified, uncomplicated: Secondary | ICD-10-CM

## 2015-07-09 DIAGNOSIS — Z0000 Encounter for general adult medical examination without abnormal findings: Secondary | ICD-10-CM | POA: Diagnosis not present

## 2015-07-09 MED ORDER — CITALOPRAM HYDROBROMIDE 40 MG PO TABS: ORAL_TABLET | ORAL | Status: DC

## 2015-07-09 MED ORDER — CLONAZEPAM 0.5 MG PO TABS: 0.5000 mg | ORAL_TABLET | Freq: Every evening | ORAL | Status: DC | PRN

## 2015-07-09 MED ORDER — MOMETASONE FUROATE 50 MCG/ACT NA SUSP: 1.0000 | Freq: Two times a day (BID) | NASAL | Status: DC

## 2015-07-09 NOTE — Patient Instructions (Signed)
Plain Zyrtec...Marland KitchenMarland KitchenMarland Kitchen 1 at bedtime............ if your miserable take a plain Claritin the morning in addition to the Zyrtec plain a bedtime  Steroid nasal spray.......Marland Kitchen 1 shot up each nostril twice a day  The most important thing you can do for your allergies is stop smoking completely........   Begin an exercise program,,,,,,,, walk 30 minutes daily by herself

## 2015-07-09 NOTE — Progress Notes (Signed)
Pre visit review using our clinic review tool, if applicable. No additional management support is needed unless otherwise documented below in the visit note. 

## 2015-07-09 NOTE — Progress Notes (Signed)
   Subjective:    Patient ID: Felicia Good, female    DOB: February 21, 1961, 54 y.o.   MRN: [id number]  HPI Felicia Good is a 54 year old married female smoker,,,,,,,, 8 cigarettes a day,,,,,,, who comes in today for general physical examination because of history of depression, panic attacks, and allergic rhinitis  We treat her depression with Celexa 40 mg daily. She TAKES clonazepam 0.51 tablet at bedtime when necessary. She takes Flonase when necessary for allergic rhinitis and is having a lot of allergy problems this fall. She was skin tested past and told she was sensitive to ragweed  She gets routine eye care, dental care, BSE monthly, mammography 2015 normal, colonoscopy 2015 normal  LMP age 40 Pap 2014 normal low risk category therefore Pap every 3 years  She quit her full-time job to take care of her mother has dementia her father died year or 2 ago   Review of Systems  Constitutional: Negative.   HENT: Negative.   Eyes: Negative.   Respiratory: Negative.   Cardiovascular: Negative.   Gastrointestinal: Negative.   Endocrine: Negative.   Genitourinary: Negative.   Musculoskeletal: Negative.   Skin: Negative.   Allergic/Immunologic: Negative.   Neurological: Negative.   Hematological: Negative.   Psychiatric/Behavioral: Negative.        Objective:   Physical Exam  Constitutional: She appears well-developed and well-nourished.  HENT:  Head: Normocephalic and atraumatic.  Right Ear: External ear normal.  Left Ear: External ear normal.  Nose: Nose normal.  Mouth/Throat: Oropharynx is clear and moist.  Eyes: EOM are normal. Pupils are equal, round, and reactive to light.  Neck: Normal range of motion. Neck supple. No JVD present. No tracheal deviation present. No thyromegaly present.  Cardiovascular: Normal rate, regular rhythm, normal heart sounds and intact distal pulses.  Exam reveals no gallop and no friction rub.   No murmur heard. Pulmonary/Chest: Effort normal and breath  sounds normal. No stridor. No respiratory distress. She has no wheezes. She has no rales. She exhibits no tenderness.  Abdominal: Soft. Bowel sounds are normal. She exhibits no distension and no mass. There is no tenderness. There is no rebound and no guarding.  Genitourinary:  Bilateral breast exam normal  Musculoskeletal: Normal range of motion. She exhibits no edema or tenderness.  Lymphadenopathy:    She has no cervical adenopathy.  Neurological: She is alert. She has normal reflexes. No cranial nerve deficit. She exhibits normal muscle tone. Coordination normal.  Skin: Skin is warm and dry. No rash noted. No erythema. No pallor.  Psychiatric: She has a normal mood and affect. Her behavior is normal. Judgment and thought content normal.  Nursing note and vitals reviewed.         Assessment & Plan:  Healthy female  Tobacco abuse,,,,,, again declines to use the patches the gum the Chantix she says all these you don't work or she has side effects. Again encouraged her to stop smoking completely with the tapering method  Allergic rhinitis,,,,,, plain Zyrtec at bedtime along with steroid nasal spray 18 shot up each nostril twice a day and stop smoking  History of depression,,,,,,,, continue Celexa 40 mg daily  History of anxiety,,,,, Klonopin 0.5 daily at bedtime when necessary

## 2015-07-15 ENCOUNTER — Telehealth: Payer: Self-pay | Admitting: Family Medicine

## 2015-07-15 DIAGNOSIS — Z0000 Encounter for general adult medical examination without abnormal findings: Secondary | ICD-10-CM

## 2015-07-15 DIAGNOSIS — M545 Low back pain: Secondary | ICD-10-CM

## 2015-07-15 NOTE — Telephone Encounter (Signed)
Pt states she had  cpe last week and was looking to get additional refills on 4 meds that ere not called in. Pt request refill   cyclobenzaprine (FLEXERIL) 10 MG tablet HYDROcodone-acetaminophen (VICODIN ES) 7.5-750 MG per tablet predniSONE (DELTASONE) 20 MG tablet triamcinolone cream (KENALOG) 0.1 %  [facility]

## 2015-07-17 MED ORDER — TRAMADOL HCL 50 MG PO TABS: 50.0000 mg | ORAL_TABLET | Freq: Three times a day (TID) | ORAL | Status: DC | PRN

## 2015-07-17 MED ORDER — CYCLOBENZAPRINE HCL 10 MG PO TABS: 10.0000 mg | ORAL_TABLET | Freq: Three times a day (TID) | ORAL | Status: DC | PRN

## 2015-07-17 MED ORDER — PREDNISONE 20 MG PO TABS: 20.0000 mg | ORAL_TABLET | Freq: Every day | ORAL | Status: DC

## 2015-07-17 MED ORDER — TRIAMCINOLONE ACETONIDE 0.1 % EX CREA: 1.0000 "application " | TOPICAL_CREAM | Freq: Two times a day (BID) | CUTANEOUS | Status: DC

## 2015-07-17 NOTE — Telephone Encounter (Signed)
Dr Sherren Mocha would like the patient to try Tramadol instead of hydrocodone.  Patient is aware and refills sent.

## 2015-08-07 ENCOUNTER — Other Ambulatory Visit: Payer: Self-pay

## 2015-08-07 DIAGNOSIS — Z1231 Encounter for screening mammogram for malignant neoplasm of breast: Secondary | ICD-10-CM

## 2015-09-04 ENCOUNTER — Ambulatory Visit
Admission: RE | Admit: 2015-09-04 | Discharge: 2015-09-04 | Disposition: A | Discharge status: Home / Self Care | Payer: BLUE CROSS/BLUE SHIELD | Source: Ambulatory Visit

## 2015-09-04 DIAGNOSIS — Z1231 Encounter for screening mammogram for malignant neoplasm of breast: Secondary | ICD-10-CM

## 2016-01-09 ENCOUNTER — Ambulatory Visit (INDEPENDENT_AMBULATORY_CARE_PROVIDER_SITE_OTHER): Payer: BLUE CROSS/BLUE SHIELD | Admitting: Family Medicine

## 2016-01-09 ENCOUNTER — Encounter: Payer: Self-pay | Admitting: Family Medicine

## 2016-01-09 VITALS — BP 100/72 | HR 76 | Temp 97.8°F | Ht 67.0 in | Wt 152.6 lb

## 2016-01-09 DIAGNOSIS — M25512 Pain in left shoulder: Secondary | ICD-10-CM

## 2016-01-09 NOTE — Progress Notes (Signed)
HPI:  Felicia Good  is a pleasant 55 year old here for an acute visit for left shoulder pain. She reports a history of similar pain in the right shoulder about 1 year ago, and was referred to an orthopedic doctor for this with a diagnosis of rotator cuff tear per her report. She wishes to return to her orthopedic doctor, but was told she needs a referral given her insurance. The pain is in the left lateral shoulder and is constant, but is worse/moderate to severe with adduction and flexion of the shoulder above 90 degrees.  This is been going on for 1-2 months. Denies weakness, numbness, malaise, fevers, radiation to lower arm. She cannot think of any inciting event. She does not know the name of the doctor that she saw but know it was Raliegh Ip. I cannot find records of this referral.  ROS: See pertinent positives and negatives per HPI.  Past Medical History  Diagnosis Date  . Depression   . IBS (irritable bowel syndrome)   . Panic attacks   . Rectal polyp   . Anxiety   . RLS (restless legs syndrome)   . Migraine headache   . Mitral valve prolapse     Past Surgical History  Procedure Laterality Date  . Rectal polyp removed  1987    Local anesthesia used    Family History  Problem Relation Age of Onset  . Breast cancer Maternal Aunt   . Other Father     esophageal problems  . Colon cancer Neg Hx     Social History   Social History  . Marital Status: Married    Spouse Name: N/A  . Number of Children: 1  . Years of Education: N/A   Occupational History  .     Social History Main Topics  . Smoking status: Current Every Day Smoker -- 0.50 packs/day for 35 years    Types: Cigarettes  . Smokeless tobacco: Never Used  . Alcohol Use: No  . Drug Use: No  . Sexual Activity: Not Asked   Other Topics Concern  . None   Social History Narrative     Current outpatient prescriptions:  .  citalopram (CELEXA) 40 MG tablet, TAKE 1 TABLET (40 MG TOTAL) BY MOUTH DAILY.,  Disp: 100 tablet, Rfl: 3 .  clonazePAM (KLONOPIN) 0.5 MG tablet, Take 1 tablet (0.5 mg total) by mouth at bedtime as needed., Disp: 100 tablet, Rfl: 3 .  cyclobenzaprine (FLEXERIL) 10 MG tablet, Take 1 tablet (10 mg total) by mouth 3 (three) times daily as needed., Disp: 90 tablet, Rfl: 1 .  mometasone (NASONEX) 50 MCG/ACT nasal spray, Place 1 spray into the nose 2 (two) times daily., Disp: 17 g, Rfl: 11 .  predniSONE (DELTASONE) 20 MG tablet, Take 1 tablet (20 mg total) by mouth daily., Disp: 50 tablet, Rfl: 2 .  traMADol (ULTRAM) 50 MG tablet, Take 1 tablet (50 mg total) by mouth every 8 (eight) hours as needed., Disp: 30 tablet, Rfl: 5 .  triamcinolone cream (KENALOG) 0.1 %, Apply 1 application topically 2 (two) times daily., Disp: 30 g, Rfl: 3  EXAM:  Filed Vitals:   01/09/16 1350  BP: 100/72  Pulse: 76  Temp: 97.8 F (36.6 C)    Body mass index is 23.89 kg/(m^2).  GENERAL: vitals reviewed and listed above, alert, oriented, appears well hydrated and in no acute distress  HEENT: atraumatic, conjunttiva clear, no obvious abnormalities on inspection of external nose and ears  NECK: no obvious  masses on inspection  MS: moves all extremities without noticeable abnormality.  Can abduct and flex shoulder above 90, but this does cause her discomfort. She has tenderness to palpation in both the rotator cuff attachments to the humerus and the deltoid muscle on the left. She has extreme discomfort with the impingement test and the empty can test. Her strength is preserved throughout in the upper extremities bilaterally, though abduction  Of the left shoulder against resistance causes pain.  Negative shawl sign. Negative apprehension test. Neurovascularly intact distally  PSYCH: pleasant and cooperative, no obvious depression or anxiety  ASSESSMENT AND PLAN:  Discussed the following assessment and plan:  Shoulder pain, left - Plan: Ambulatory referral to Orthopedic Surgery  -we discussed  possible serious and likely etiologies, workup and treatment, treatment risks and return precautions - She prefers to see her orthopedic specialist for this and desires a referral, referral placed per her request - supportive care in the interim . Return and emergency precautions. -Patient advised to return or notify a doctor immediately if symptoms worsen or persist or new concerns arise.  Patient Instructions  Please see the specialist as planned.We placed a referral for you as discussed. It usually takes about 1-2 weeks to process and schedule this referral. If you have not heard from [place] regarding this appointment in 2 weeks please contact our office.  Can use Tylenol 500 to thousand milligrams up to 3 times daily for Aleve per instructions, along with heat and topical sports creams for the pain as needed.       Colin Benton R.

## 2016-01-09 NOTE — Patient Instructions (Addendum)
Please see the specialist as planned.We placed a referral for you as discussed. It usually takes about 1-2 weeks to process and schedule this referral. If you have not heard from [place] regarding this appointment in 2 weeks please contact our office.  Can use Tylenol 500 to thousand milligrams up to 3 times daily for Aleve per instructions, along with heat and topical sports creams for the pain as needed.

## 2016-01-09 NOTE — Progress Notes (Signed)
Pre visit review using our clinic review tool, if applicable. No additional management support is needed unless otherwise documented below in the visit note. 

## 2016-05-08 ENCOUNTER — Ambulatory Visit (INDEPENDENT_AMBULATORY_CARE_PROVIDER_SITE_OTHER): Payer: BLUE CROSS/BLUE SHIELD | Admitting: Adult Health

## 2016-05-08 ENCOUNTER — Encounter: Payer: Self-pay | Admitting: Adult Health

## 2016-05-08 VITALS — BP 118/64 | Temp 98.1°F | Wt 153.2 lb

## 2016-05-08 DIAGNOSIS — F329 Major depressive disorder, single episode, unspecified: Secondary | ICD-10-CM | POA: Diagnosis not present

## 2016-05-08 DIAGNOSIS — M25512 Pain in left shoulder: Secondary | ICD-10-CM | POA: Diagnosis not present

## 2016-05-08 DIAGNOSIS — F17200 Nicotine dependence, unspecified, uncomplicated: Secondary | ICD-10-CM

## 2016-05-08 DIAGNOSIS — Z7189 Other specified counseling: Secondary | ICD-10-CM | POA: Diagnosis not present

## 2016-05-08 DIAGNOSIS — Z7689 Persons encountering health services in other specified circumstances: Secondary | ICD-10-CM

## 2016-05-08 DIAGNOSIS — F32A Depression, unspecified: Secondary | ICD-10-CM

## 2016-05-08 MED ORDER — METHYLPREDNISOLONE 4 MG PO TBPK: ORAL_TABLET | ORAL | Status: DC

## 2016-05-08 NOTE — Progress Notes (Signed)
Patient presents to clinic today to establish care. She is a pleasant 55 year old female who  has a past medical history of Depression; IBS (irritable bowel syndrome); Panic attacks; Rectal polyp; Anxiety; RLS (restless legs syndrome); Migraine headache; and Mitral valve prolapse.  Her last physical was in September 2016   Acute Concerns: Establish Care   Left shoulder pain  She reports a history of similar pain in the right shoulder that was about a year and a half ago. She was removed for to orthopedics and was diagnosed with a rotator cuff tear. More recently she seen Dr. Neomia Dear on 01/09/2016 at which time she was referred back to orthopedics. Per patient, she was given a steroid injection into the shoulder. Patient reports that the steroid injection really didn't help she is afraid to go back to orthopedics because she does not want to have surgery. She is finding it more difficult to work on the yard which is something she enjoys to do. She is also noticing a loss of grip strength in her left hand. Most of the pain is located in the scapular region with radiation into the trapezius muscle. She reports using Flexeril about once a week when she takes it at night she wakes up feeling as if her shoulder pain has improved.  Chronic Issues: Depression - She feels as though she has been a little more depressed recently. She is not working and is at home all the time. She feels like she has to listen to everyone's problems and it is getting her down.   IBS - Her IBS has improved tremendously.   Migraines - She feels like this has been well controlled recently.   Tobacco Use - She continues to smoke cigarettes. She reports that some days she does not smoke home and on other days she might smoke one or 2 cigarettes to distress. She is using the patch which she finds helpful. She does not want to try anything additional at this time.  Health Maintenance: Dental -- Twice a yearly -  Dr. Radford Pax  Vision -- Yearly - Immunizations --UTD  Colonoscopy -- 2016 - Normal  Mammogram -- 08/2015 - Normal  PAP -- 2014 - Normal   Diet: She eats healthy  Exercise: Walks with her dog.    Past Medical History  Diagnosis Date  . Depression   . IBS (irritable bowel syndrome)   . Panic attacks   . Rectal polyp   . Anxiety   . RLS (restless legs syndrome)   . Migraine headache   . Mitral valve prolapse     Past Surgical History  Procedure Laterality Date  . Rectal polyp removed  1987    Local anesthesia used    Current Outpatient Prescriptions on File Prior to Visit  Medication Sig Dispense Refill  . citalopram (CELEXA) 40 MG tablet TAKE 1 TABLET (40 MG TOTAL) BY MOUTH DAILY. 100 tablet 3  . clonazePAM (KLONOPIN) 0.5 MG tablet Take 1 tablet (0.5 mg total) by mouth at bedtime as needed. 100 tablet 3  . cyclobenzaprine (FLEXERIL) 10 MG tablet Take 1 tablet (10 mg total) by mouth 3 (three) times daily as needed. 90 tablet 1  . mometasone (NASONEX) 50 MCG/ACT nasal spray Place 1 spray into the nose 2 (two) times daily. 17 g 11  . predniSONE (DELTASONE) 20 MG tablet Take 1 tablet (20 mg total) by mouth daily. 50 tablet 2  . traMADol (ULTRAM) 50 MG tablet Take 1 tablet (  50 mg total) by mouth every 8 (eight) hours as needed. 30 tablet 5  . triamcinolone cream (KENALOG) 0.1 % Apply 1 application topically 2 (two) times daily. 30 g 3   No current facility-administered medications on file prior to visit.    No Known Allergies  Family History  Problem Relation Age of Onset  . Breast cancer Maternal Aunt   . Other Father     esophageal problems  . Colon cancer Neg Hx     Social History   Social History  . Marital Status: Married    Spouse Name: N/A  . Number of Children: 1  . Years of Education: N/A   Occupational History  .     Social History Main Topics  . Smoking status: Current Every Day Smoker -- 0.50 packs/day for 35 years    Types: Cigarettes  . Smokeless  tobacco: Never Used  . Alcohol Use: No  . Drug Use: No  . Sexual Activity: Not on file   Other Topics Concern  . Not on file   Social History Narrative    Review of Systems  Constitutional: Negative.   Respiratory: Negative.   Cardiovascular: Negative.   Gastrointestinal: Negative.   Genitourinary: Negative.   Musculoskeletal: Positive for back pain, joint pain and neck pain.  Neurological: Negative.   Psychiatric/Behavioral: Positive for depression. Negative for suicidal ideas and memory loss. The patient does not have insomnia.   All other systems reviewed and are negative.   BP 118/64 mmHg  Temp(Src) 98.1 F (36.7 C) (Oral)  Wt 153 lb 3.2 oz (69.491 kg)  LMP 11/26/2013  Physical Exam  Constitutional: She is oriented to person, place, and time and well-developed, well-nourished, and in no distress. No distress.  Cardiovascular: Normal rate, regular rhythm, normal heart sounds and intact distal pulses.  Exam reveals no gallop and no friction rub.   No murmur heard. Pulmonary/Chest: Effort normal and breath sounds normal. No respiratory distress. She has no wheezes. She has no rales. She exhibits no tenderness.  Musculoskeletal: She exhibits tenderness.       Left shoulder: She exhibits decreased range of motion, tenderness, pain and decreased strength. She exhibits no bony tenderness, no swelling and no crepitus.  Complains of pain with palpation to left scapula as well as left trapezius. No decreased grip strength noted. She did report a pulling sensation with empty can test. She could raise her arm above halfway point  Neurological: She is alert and oriented to person, place, and time. She has normal reflexes. Gait normal. GCS score is 15.  Skin: Skin is warm and dry. No rash noted. She is not diaphoretic. No erythema. No pallor.  Psychiatric: Mood, memory, affect and judgment normal.  Nursing note and vitals reviewed.  Assessment/Plan:  1. Encounter to establish  care - Follow-up in the fall for complete physical exam -Follow-up sooner with any acute issues - Continue to work on diet and exercise  2. Depression - Advised her to get the house more, apparent that she needs some time to herself and away from family members. - Can follow-up with psychiatry as needed  3. TOBACCO ABUSE - She will inform us if she wants any additional help with quitting smoking  4. Left shoulder pain - He is to be more of a muscle issue then bone issue at this time. She has Flexeril at home and would have her take it nightly for the next 45 nights that she takes the Medrol Dosepak - methylPREDNISolone (  MEDROL DOSEPAK) 4 MG TBPK tablet; Take as directed  Dispense: 21 tablet; Refill: 0 - Motrin 600 mg every 8 hours for the next 3 days - Heating pad - Consider  referral back to orthopedics or sports medicine  Dorothyann Peng, NP

## 2016-05-08 NOTE — Patient Instructions (Signed)
It was great seeing you again  Please follow up for your physical this fall  I have sent in a prescription for a Medrol Dose pack. Take this for the next 6 days and take a muscle relaxer every night.   Also take 600mg  Ibuprofen every 8 hours for the next 3 days.

## 2016-07-02 ENCOUNTER — Other Ambulatory Visit (INDEPENDENT_AMBULATORY_CARE_PROVIDER_SITE_OTHER): Payer: BLUE CROSS/BLUE SHIELD

## 2016-07-02 DIAGNOSIS — Z0000 Encounter for general adult medical examination without abnormal findings: Secondary | ICD-10-CM | POA: Diagnosis not present

## 2016-07-02 LAB — LIPID PANEL
CHOLESTEROL: 242 mg/dL — AB (ref 0–200)
HDL: 50.3 mg/dL (ref >39.00)
LDL Cholesterol: 158 mg/dL — ABNORMAL HIGH (ref 0–99)
NonHDL: 191.78
TRIGLYCERIDES: 167 mg/dL — AB (ref 0.0–149.0)
Total CHOL/HDL Ratio: 5
VLDL: 33.4 mg/dL (ref 0.0–40.0)

## 2016-07-02 LAB — POC URINALSYSI DIPSTICK (AUTOMATED)
BILIRUBIN UA: NEGATIVE
Glucose, UA: NEGATIVE
KETONES UA: NEGATIVE
NITRITE UA: NEGATIVE
PH UA: 6.5
Protein, UA: NEGATIVE
RBC UA: NEGATIVE
Spec Grav, UA: 1.02
UROBILINOGEN UA: 0.2

## 2016-07-02 LAB — HEPATIC FUNCTION PANEL
ALBUMIN: 4.2 g/dL (ref 3.5–5.2)
ALT: 33 U/L (ref 0–35)
AST: 23 U/L (ref 0–37)
Alkaline Phosphatase: 57 U/L (ref 39–117)
Bilirubin, Direct: 0.1 mg/dL (ref 0.0–0.3)
TOTAL PROTEIN: 7 g/dL (ref 6.0–8.3)
Total Bilirubin: 0.3 mg/dL (ref 0.2–1.2)

## 2016-07-02 LAB — TSH: TSH: 4.22 u[IU]/mL (ref 0.35–4.50)

## 2016-07-02 LAB — BASIC METABOLIC PANEL
BUN: 18 mg/dL (ref 6–23)
CHLORIDE: 105 meq/L (ref 96–112)
CO2: 29 meq/L (ref 19–32)
CREATININE: 0.65 mg/dL (ref 0.40–1.20)
Calcium: 9.3 mg/dL (ref 8.4–10.5)
GFR: 99.99 mL/min (ref >60.00)
Glucose, Bld: 103 mg/dL — ABNORMAL HIGH (ref 70–99)
POTASSIUM: 3.7 meq/L (ref 3.5–5.1)
Sodium: 139 mEq/L (ref 135–145)

## 2016-07-02 LAB — CBC WITH DIFFERENTIAL/PLATELET
BASOS PCT: 0.7 % (ref 0.0–3.0)
Basophils Absolute: 0 10*3/uL (ref 0.0–0.1)
EOS ABS: 0.2 10*3/uL (ref 0.0–0.7)
EOS PCT: 3.9 % (ref 0.0–5.0)
HEMATOCRIT: 36.9 % (ref 36.0–46.0)
HEMOGLOBIN: 12.8 g/dL (ref 12.0–15.0)
LYMPHS PCT: 39 % (ref 12.0–46.0)
Lymphs Abs: 2.2 10*3/uL (ref 0.7–4.0)
MCHC: 34.7 g/dL (ref 30.0–36.0)
MCV: 93.4 fl (ref 78.0–100.0)
Monocytes Absolute: 0.4 10*3/uL (ref 0.1–1.0)
Monocytes Relative: 7.1 % (ref 3.0–12.0)
NEUTROS ABS: 2.8 10*3/uL (ref 1.4–7.7)
Neutrophils Relative %: 49.3 % (ref 43.0–77.0)
PLATELETS: 195 10*3/uL (ref 150.0–400.0)
RBC: 3.95 Mil/uL (ref 3.87–5.11)
RDW: 14.1 % (ref 11.5–15.5)
WBC: 5.8 10*3/uL (ref 4.0–10.5)

## 2016-07-09 ENCOUNTER — Other Ambulatory Visit (HOSPITAL_COMMUNITY)
Admission: RE | Admit: 2016-07-09 | Discharge: 2016-07-09 | Disposition: A | Discharge status: Home / Self Care | Payer: BLUE CROSS/BLUE SHIELD | Source: Ambulatory Visit | Attending: Adult Health | Admitting: Adult Health

## 2016-07-09 ENCOUNTER — Encounter: Payer: Self-pay | Admitting: Adult Health

## 2016-07-09 ENCOUNTER — Ambulatory Visit (INDEPENDENT_AMBULATORY_CARE_PROVIDER_SITE_OTHER): Payer: BLUE CROSS/BLUE SHIELD | Admitting: Adult Health

## 2016-07-09 VITALS — BP 124/72 | Temp 98.2°F | Ht 67.0 in | Wt 153.8 lb

## 2016-07-09 DIAGNOSIS — F17200 Nicotine dependence, unspecified, uncomplicated: Secondary | ICD-10-CM

## 2016-07-09 DIAGNOSIS — Z1151 Encounter for screening for human papillomavirus (HPV): Secondary | ICD-10-CM | POA: Insufficient documentation

## 2016-07-09 DIAGNOSIS — E785 Hyperlipidemia, unspecified: Secondary | ICD-10-CM

## 2016-07-09 DIAGNOSIS — L509 Urticaria, unspecified: Secondary | ICD-10-CM

## 2016-07-09 DIAGNOSIS — Z23 Encounter for immunization: Secondary | ICD-10-CM

## 2016-07-09 DIAGNOSIS — Z01419 Encounter for gynecological examination (general) (routine) without abnormal findings: Secondary | ICD-10-CM | POA: Insufficient documentation

## 2016-07-09 DIAGNOSIS — F32A Depression, unspecified: Secondary | ICD-10-CM

## 2016-07-09 DIAGNOSIS — Z0000 Encounter for general adult medical examination without abnormal findings: Secondary | ICD-10-CM

## 2016-07-09 DIAGNOSIS — F410 Panic disorder [episodic paroxysmal anxiety] without agoraphobia: Secondary | ICD-10-CM | POA: Diagnosis not present

## 2016-07-09 DIAGNOSIS — F329 Major depressive disorder, single episode, unspecified: Secondary | ICD-10-CM | POA: Diagnosis not present

## 2016-07-09 MED ORDER — TRIAMCINOLONE ACETONIDE 0.1 % EX CREA: 1.0000 "application " | TOPICAL_CREAM | Freq: Two times a day (BID) | CUTANEOUS | 0 refills | Status: DC

## 2016-07-09 NOTE — Progress Notes (Signed)
Subjective:    Patient ID: Felicia Good, female    DOB: [birth year], 55 y.o.   MRN: [id number]  HPI  Patient presents for yearly preventative medicine examination. She is a pleasant 55 year old female who  has a past medical history of Anxiety; Depression; IBS (irritable bowel syndrome); Migraine headache; Mitral valve prolapse; Panic attacks; Rectal polyp; and RLS (restless legs syndrome).   All immunizations and health maintenance protocols were reviewed with the patient and needed orders were placed.  Appropriate screening laboratory values were ordered for the patient including screening of hyperlipidemia, renal function and hepatic function.  Medication reconciliation,  past medical history, social history, problem list and allergies were reviewed in detail with the patient  Goals were established with regard to weight loss, exercise, and  diet in compliance with medications. She tries to eat healthy and is exercising daily.   She continues to smoke about 10 cigarettes per day. She is using the patch. She wants to quit and knows that she needs to but likes to smoke as a stress relief. She does not want to try any other medications at this time.   She is up to date on her mammogram, dental and colonoscopy. She needs to have her vision checked. She does home breast exams monthly and has not noticed any changes in her breast.       Review of Systems  Constitutional: Negative.   HENT: Negative.   Eyes: Negative.   Respiratory: Negative.   Cardiovascular: Negative.   Gastrointestinal: Negative.   Endocrine: Negative.   Genitourinary: Negative.   Musculoskeletal: Positive for arthralgias (left shoulder - chronic ).  Skin: Negative.   Allergic/Immunologic: Negative.   Neurological: Negative.   Hematological: Negative.   Psychiatric/Behavioral: Negative.   All other systems reviewed and are negative.  Past Medical History:  Diagnosis Date  . Anxiety   . Depression   . IBS  (irritable bowel syndrome)   . Migraine headache   . Mitral valve prolapse   . Panic attacks   . Rectal polyp   . RLS (restless legs syndrome)     Social History   Social History  . Marital status: Married    Spouse name: N/A  . Number of children: 1  . Years of education: N/A   Occupational History  .  Unemployed   Social History Main Topics  . Smoking status: Current Every Day Smoker    Packs/day: 0.50    Years: 35.00    Types: Cigarettes  . Smokeless tobacco: Never Used  . Alcohol use 0.0 oz/week     Comment: occ  . Drug use: No  . Sexual activity: Not on file   Other Topics Concern  . Not on file   Social History Narrative   Not employed    Married    One child       [place], she likes to go to [employer].     Past Surgical History:  Procedure Laterality Date  . rectal polyp removed  1987   Local anesthesia used    Family History  Problem Relation Age of Onset  . Breast cancer Maternal Aunt   . Other Father     esophageal problems  . Colon cancer Neg Hx   . Heart failure Father   . Cirrhosis Father     Non alcoholic   . Stroke Father     Died in 2014-02-04  No Known Allergies  Current Outpatient Prescriptions on File Prior to Visit  Medication Sig Dispense Refill  . citalopram (CELEXA) 40 MG tablet TAKE 1 TABLET (40 MG TOTAL) BY MOUTH DAILY. 100 tablet 3  . clonazePAM (KLONOPIN) 0.5 MG tablet Take 1 tablet (0.5 mg total) by mouth at bedtime as needed. 100 tablet 3  . cyclobenzaprine (FLEXERIL) 10 MG tablet Take 1 tablet (10 mg total) by mouth 3 (three) times daily as needed. 90 tablet 1  . mometasone (NASONEX) 50 MCG/ACT nasal spray Place 1 spray into the nose 2 (two) times daily. 17 g 11  . traMADol (ULTRAM) 50 MG tablet Take 1 tablet (50 mg total) by mouth every 8 (eight) hours as needed. 30 tablet 5   No current facility-administered medications on file prior to visit.     BP 124/72   Temp 98.2 F  (36.8 C) (Oral)   Ht 5\' 7"  (1.702 m)   Wt 153 lb 12.8 oz (69.8 kg)   LMP 11/26/2013   BMI 24.09 kg/m       Objective:   Physical Exam  Constitutional: She is oriented to person, place, and time. She appears well-developed and well-nourished. No distress.  HENT:  Head: Normocephalic and atraumatic.  Right Ear: External ear normal.  Left Ear: External ear normal.  Nose: Nose normal.  Mouth/Throat: Oropharynx is clear and moist. No oropharyngeal exudate.  Eyes: Conjunctivae and EOM are normal. Pupils are equal, round, and reactive to light. Right eye exhibits no discharge. Left eye exhibits no discharge. No scleral icterus.  Neck: Normal range of motion. Neck supple. No JVD present. Carotid bruit is not present. No tracheal deviation present. No thyromegaly present.  Cardiovascular: Normal rate, regular rhythm, normal heart sounds and intact distal pulses.  Exam reveals no gallop and no friction rub.   No murmur heard. Pulmonary/Chest: Effort normal and breath sounds normal. No stridor. No respiratory distress. She has no wheezes. She has no rales. She exhibits no tenderness.  Abdominal: Soft. Bowel sounds are normal. She exhibits no distension and no mass. There is no tenderness. There is no rebound and no guarding.  Genitourinary: Vagina normal. There is breast tenderness. Pelvic exam was performed with patient supine.  Genitourinary Comments: Dense breast tissue. No dimpling or discharge noted.   Musculoskeletal: Normal range of motion. She exhibits no edema, tenderness or deformity.  Lymphadenopathy:    She has no cervical adenopathy.  Neurological: She is alert and oriented to person, place, and time. She has normal strength and normal reflexes. She displays normal reflexes. No cranial nerve deficit. She exhibits normal muscle tone. Coordination normal.  Skin: Skin is warm and dry. No rash noted. No erythema. No pallor.  Psychiatric: She has a normal mood and affect. Her behavior is  normal. Judgment and thought content normal.  Nursing note and vitals reviewed.      Assessment & Plan:  1. Routine general medical examination at a health care facility - Reviewed labs in detail with patient. 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 Possible artifact. She is asymptomatic. Will continue to monitor.  - Advised heart healthy diet and continued exercise - PAP [[place]] - Follow up in one year or sooner if needed 2. Depression - Controlled with Celexa. She continues to have periods of depression but is able to make it through.   3. TOBACCO ABUSE - She does not want to use anything else besides the patch to  help her quit smoking.  - Encouraged total reduction in cigarette smoking   4. PANIC DISORDER,NO AGORAPHOBIA - Controlled with Celexa and klonopin   5. URTICARIA NOS - triamcinolone cream (KENALOG) 0.1 %; Apply 1 application topically 2 (two) times daily.  Dispense: 454 g; Refill: 0  6. Hyperlipidemia - 10 year cardiac risk factor of 6.2%. No indication to put on statin at this time Lab Results  Component Value Date   CHOL 242 (H) 07/02/2016   HDL 50.30 07/02/2016   LDLCALC 158 (H) 07/02/2016   LDLDIRECT 128.3 11/15/2012   TRIG 167.0 (H) 07/02/2016   CHOLHDL 5 07/02/2016  - Encouraged changes in diet and exercise  7. Need for prophylactic vaccination and inoculation against influenza - Flu Vaccine QUAD 36+ mos PF IM (Fluarix & Fluzone Quad PF)  Dorothyann Peng, NP

## 2016-07-09 NOTE — Patient Instructions (Addendum)
It was great seeing you again   Please work on quitting smoking.   I have sent in a prescription for the kenalog cream   As discussed your cholesterol level is high. We do not need to start you on a statin at this time. Please work on diet and continue exercising.   Follow up in year for next physical exam   Health Maintenance, Female Adopting a healthy lifestyle and getting preventive care can go a long way to promote health and wellness. Talk with your health care provider about what schedule of regular examinations is right for you. This is a good chance for you to check in with your provider about disease prevention and staying healthy. In between checkups, there are plenty of things you can do on your own. Experts have done a lot of research about which lifestyle changes and preventive measures are most likely to keep you healthy. Ask your health care provider for more information. WEIGHT AND DIET  Eat a healthy diet  Be sure to include plenty of vegetables, fruits, low-fat dairy products, and lean protein.  Do not eat a lot of foods high in solid fats, added sugars, or salt.  Get regular exercise. This is one of the most important things you can do for your health.  Most adults should exercise for at least 150 minutes each week. The exercise should increase your heart rate and make you sweat (moderate-intensity exercise).  Most adults should also do strengthening exercises at least twice a week. This is in addition to the moderate-intensity exercise.  Maintain a healthy weight  Body mass index (BMI) is a measurement that can be used to identify possible weight problems. It estimates body fat based on height and weight. Your health care provider can help determine your BMI and help you achieve or maintain a healthy weight.  For females 48 years of age and older:   A BMI below 18.5 is considered underweight.  A BMI of 18.5 to 24.9 is normal.  A BMI of 25 to 29.9 is considered  overweight.  A BMI of 30 and above is considered obese.  Watch levels of cholesterol and blood lipids  You should start having your blood tested for lipids and cholesterol at 56 years of age, then have this test every 5 years.  You may need to have your cholesterol levels checked more often if:  Your lipid or cholesterol levels are high.  You are older than 56 years of age.  You are at high risk for heart disease.  CANCER SCREENING   Lung Cancer  Lung cancer screening is recommended for adults 35-5 years old who are at high risk for lung cancer because of a history of smoking.  A yearly low-dose CT scan of the lungs is recommended for people who:  Currently smoke.  Have quit within the past 15 years.  Have at least a 30-pack-year history of smoking. A pack year is smoking an average of one pack of cigarettes a day for 1 year.  Yearly screening should continue until it has been 15 years since you quit.  Yearly screening should stop if you develop a health problem that would prevent you from having lung cancer treatment.  Breast Cancer  Practice breast self-awareness. This means understanding how your breasts normally appear and feel.  It also means doing regular breast self-exams. Let your health care provider know about any changes, no matter how small.  If you are in your 20s or 30s,  you should have a clinical breast exam (CBE) by a health care provider every 1-3 years as part of a regular health exam.  If you are 39 or older, have a CBE every year. Also consider having a breast X-ray (mammogram) every year.  If you have a family history of breast cancer, talk to your health care provider about genetic screening.  If you are at high risk for breast cancer, talk to your health care provider about having an MRI and a mammogram every year.  Breast cancer gene (BRCA) assessment is recommended for women who have family members with BRCA-related cancers. BRCA-related  cancers include:  Breast.  Ovarian.  Tubal.  Peritoneal cancers.  Results of the assessment will determine the need for genetic counseling and BRCA1 and BRCA2 testing. Cervical Cancer Your health care provider may recommend that you be screened regularly for cancer of the pelvic organs (ovaries, uterus, and vagina). This screening involves a pelvic examination, including checking for microscopic changes to the surface of your cervix (Pap test). You may be encouraged to have this screening done every 3 years, beginning at age 70.  For women ages 18-65, health care providers may recommend pelvic exams and Pap testing every 3 years, or they may recommend the Pap and pelvic exam, combined with testing for human papilloma virus (HPV), every 5 years. Some types of HPV increase your risk of cervical cancer. Testing for HPV may also be done on women of any age with unclear Pap test results.  Other health care providers may not recommend any screening for nonpregnant women who are considered low risk for pelvic cancer and who do not have symptoms. Ask your health care provider if a screening pelvic exam is right for you.  If you have had past treatment for cervical cancer or a condition that could lead to cancer, you need Pap tests and screening for cancer for at least 20 years after your treatment. If Pap tests have been discontinued, your risk factors (such as having a new sexual partner) need to be reassessed to determine if screening should resume. Some women have medical problems that increase the chance of getting cervical cancer. In these cases, your health care provider may recommend more frequent screening and Pap tests. Colorectal Cancer  This type of cancer can be detected and often prevented.  Routine colorectal cancer screening usually begins at 56 years of age and continues through 56 years of age.  Your health care provider may recommend screening at an earlier age if you have risk  factors for colon cancer.  Your health care provider may also recommend using home test kits to check for hidden blood in the stool.  A small camera at the end of a tube can be used to examine your colon directly (sigmoidoscopy or colonoscopy). This is done to check for the earliest forms of colorectal cancer.  Routine screening usually begins at age 60.  Direct examination of the colon should be repeated every 5-10 years through 56 years of age. However, you may need to be screened more often if early forms of precancerous polyps or small growths are found. Skin Cancer  Check your skin from head to toe regularly.  Tell your health care provider about any new moles or changes in moles, especially if there is a change in a mole's shape or color.  Also tell your health care provider if you have a mole that is larger than the size of a pencil eraser.  Always use  sunscreen. Apply sunscreen liberally and repeatedly throughout the day.  Protect yourself by wearing long sleeves, pants, a wide-brimmed hat, and sunglasses whenever you are outside. HEART DISEASE, DIABETES, AND HIGH BLOOD PRESSURE   High blood pressure causes heart disease and increases the risk of stroke. High blood pressure is more likely to develop in:  People who have blood pressure in the high end of the normal range (130-139/85-89 mm Hg).  People who are overweight or obese.  People who are African American.  If you are 65-26 years of age, have your blood pressure checked every 3-5 years. If you are 76 years of age or older, have your blood pressure checked every year. You should have your blood pressure measured twice--once when you are at a hospital or clinic, and once when you are not at a hospital or clinic. Record the average of the two measurements. To check your blood pressure when you are not at a hospital or clinic, you can use:  An automated blood pressure machine at a pharmacy.  A home blood pressure  monitor.  If you are between 39 years and 44 years old, ask your health care provider if you should take aspirin to prevent strokes.  Have regular diabetes screenings. This involves taking a blood sample to check your fasting blood sugar level.  If you are at a normal weight and have a low risk for diabetes, have this test once every three years after 56 years of age.  If you are overweight and have a high risk for diabetes, consider being tested at a younger age or more often. PREVENTING INFECTION  Hepatitis B  If you have a higher risk for hepatitis B, you should be screened for this virus. You are considered at high risk for hepatitis B if:  You were born in a country where hepatitis B is common. Ask your health care provider which countries are considered high risk.  Your parents were born in a high-risk country, and you have not been immunized against hepatitis B (hepatitis B vaccine).  You have HIV or AIDS.  You use needles to inject street drugs.  You live with someone who has hepatitis B.  You have had sex with someone who has hepatitis B.  You get hemodialysis treatment.  You take certain medicines for conditions, including cancer, organ transplantation, and autoimmune conditions. Hepatitis C  Blood testing is recommended for:  Everyone born from [birth year] through 1966.  Anyone with known risk factors for hepatitis C. Sexually transmitted infections (STIs)  You should be screened for sexually transmitted infections (STIs) including gonorrhea and chlamydia if:  You are sexually active and are younger than 56 years of age.  You are older than 56 years of age and your health care provider tells you that you are at risk for this type of infection.  Your sexual activity has changed since you were last screened and you are at an increased risk for chlamydia or gonorrhea. Ask your health care provider if you are at risk.  If you do not have HIV, but are at risk, it may be  recommended that you take a prescription medicine daily to prevent HIV infection. This is called pre-exposure prophylaxis (PrEP). You are considered at risk if:  You are sexually active and do not regularly use condoms or know the HIV status of your partner(s).  You take drugs by injection.  You are sexually active with a partner who has HIV. Talk with your health care provider  about whether you are at high risk of being infected with HIV. If you choose to begin PrEP, you should first be tested for HIV. You should then be tested every 3 months for as long as you are taking PrEP.  PREGNANCY   If you are premenopausal and you may become pregnant, ask your health care provider about preconception counseling.  If you may become pregnant, take 400 to 800 micrograms (mcg) of folic acid every day.  If you want to prevent pregnancy, talk to your health care provider about birth control (contraception). OSTEOPOROSIS AND MENOPAUSE   Osteoporosis is a disease in which the bones lose minerals and strength with aging. This can result in serious bone fractures. Your risk for osteoporosis can be identified using a bone density scan.  If you are 7 years of age or older, or if you are at risk for osteoporosis and fractures, ask your health care provider if you should be screened.  Ask your health care provider whether you should take a calcium or vitamin D supplement to lower your risk for osteoporosis.  Menopause may have certain physical symptoms and risks.  Hormone replacement therapy may reduce some of these symptoms and risks. Talk to your health care provider about whether hormone replacement therapy is right for you.  HOME CARE INSTRUCTIONS   Schedule regular health, dental, and eye exams.  Stay current with your immunizations.   Do not use any tobacco products including cigarettes, chewing tobacco, or electronic cigarettes.  If you are pregnant, do not drink alcohol.  If you are  breastfeeding, limit how much and how often you drink alcohol.  Limit alcohol intake to no more than 1 drink per day for nonpregnant women. One drink equals 12 ounces of beer, 5 ounces of wine, or 1 ounces of hard liquor.  Do not use street drugs.  Do not share needles.  Ask your health care provider for help if you need support or information about quitting drugs.  Tell your health care provider if you often feel depressed.  Tell your health care provider if you have ever been abused or do not feel safe at home.   This information is not intended to replace advice given to you by your health care provider. Make sure you discuss any questions you have with your health care provider.   Document Released: 04/27/2011 Document Revised: 11/02/2014 Document Reviewed: 09/13/2013 Elsevier Interactive Patient Education [employer].

## 2016-07-10 LAB — CYTOLOGY - PAP

## 2016-10-28 DIAGNOSIS — Z6824 Body mass index (BMI) 24.0-24.9, adult: Secondary | ICD-10-CM | POA: Diagnosis not present

## 2016-10-28 DIAGNOSIS — Z1231 Encounter for screening mammogram for malignant neoplasm of breast: Secondary | ICD-10-CM | POA: Diagnosis not present

## 2016-10-28 DIAGNOSIS — N939 Abnormal uterine and vaginal bleeding, unspecified: Secondary | ICD-10-CM | POA: Diagnosis not present

## 2016-10-29 LAB — HM MAMMOGRAPHY

## 2016-10-31 ENCOUNTER — Other Ambulatory Visit: Payer: Self-pay | Admitting: Family Medicine

## 2016-10-31 DIAGNOSIS — F17200 Nicotine dependence, unspecified, uncomplicated: Secondary | ICD-10-CM

## 2016-10-31 DIAGNOSIS — F410 Panic disorder [episodic paroxysmal anxiety] without agoraphobia: Secondary | ICD-10-CM

## 2016-11-02 ENCOUNTER — Telehealth: Payer: Self-pay | Admitting: Emergency Medicine

## 2016-11-02 NOTE — Telephone Encounter (Signed)
Spoke with pt and informed her that her prescription is ready for pickup

## 2016-11-04 ENCOUNTER — Other Ambulatory Visit: Payer: Self-pay | Admitting: Adult Health

## 2016-11-04 DIAGNOSIS — F17200 Nicotine dependence, unspecified, uncomplicated: Secondary | ICD-10-CM

## 2016-11-04 DIAGNOSIS — F410 Panic disorder [episodic paroxysmal anxiety] without agoraphobia: Secondary | ICD-10-CM

## 2016-11-04 MED ORDER — CLONAZEPAM 0.5 MG PO TABS: 0.5000 mg | ORAL_TABLET | Freq: Every evening | ORAL | 0 refills | Status: DC | PRN

## 2016-11-04 MED ORDER — CITALOPRAM HYDROBROMIDE 40 MG PO TABS: 40.0000 mg | ORAL_TABLET | Freq: Every day | ORAL | 1 refills | Status: DC

## 2016-11-10 ENCOUNTER — Encounter: Payer: Self-pay | Admitting: Adult Health

## 2016-11-30 DIAGNOSIS — R32 Unspecified urinary incontinence: Secondary | ICD-10-CM | POA: Diagnosis not present

## 2016-12-07 DIAGNOSIS — R32 Unspecified urinary incontinence: Secondary | ICD-10-CM | POA: Diagnosis not present

## 2016-12-14 DIAGNOSIS — R32 Unspecified urinary incontinence: Secondary | ICD-10-CM | POA: Diagnosis not present

## 2016-12-21 DIAGNOSIS — R32 Unspecified urinary incontinence: Secondary | ICD-10-CM | POA: Diagnosis not present

## 2016-12-28 DIAGNOSIS — R32 Unspecified urinary incontinence: Secondary | ICD-10-CM | POA: Diagnosis not present

## 2017-01-11 DIAGNOSIS — R32 Unspecified urinary incontinence: Secondary | ICD-10-CM | POA: Diagnosis not present

## 2017-02-08 ENCOUNTER — Encounter: Payer: Self-pay | Admitting: Internal Medicine

## 2017-02-08 NOTE — Progress Notes (Deleted)
Provider:  Veleta Miners Location:   [facility] Room Number: 142/W Place of Service:  SNF (31)  PCP: Dorothyann Peng, NP Patient Care Team: Dorothyann Peng, NP as PCP - General (Family Medicine)  Extended Emergency Contact Information Primary Emergency Contact: Pyka,Carl Address: [address]          [place] [employer] Phone: [phone] Relation: Spouse  Code Status: DNR Goals of Care: Advanced Directive information Advanced Directives 02/08/2017  Does Patient Have a Medical Advance Directive? Yes  Type of Advance Directive Out of facility DNR (pink MOST or yellow form)  Does patient want to make changes to medical advance directive? No - Patient declined      Chief Complaint  Patient presents with  . New Admit To SNF    HPI: Patient is a 56 y.o. female seen today for admission to  Past Medical History:  Diagnosis Date  . Anxiety   . Depression   . IBS (irritable bowel syndrome)   . Migraine headache   . Mitral valve prolapse   . Panic attacks   . Rectal polyp   . RLS (restless legs syndrome)    Past Surgical History:  Procedure Laterality Date  . rectal polyp removed  1987   Local anesthesia used    reports that she has been smoking Cigarettes.  She has a 17.50 pack-year smoking history. She has never used smokeless tobacco. She reports that she drinks alcohol. She reports that she does not use drugs. Social History   Social History  . Marital status: Married    Spouse name: N/A  . Number of children: 1  . Years of education: N/A   Occupational History  .  Unemployed   Social History Main Topics  . Smoking status: Current Every Day Smoker    Packs/day: 0.50    Years: 35.00    Types: Cigarettes  . Smokeless tobacco: Never Used  . Alcohol use 0.0 oz/week     Comment: occ  . Drug use: No  . Sexual activity: Not on file   Other Topics Concern  . Not on file   Social History Narrative   Not employed    Married    One child       [place], walking with dog, she likes to go to [employer].     Functional Status Survey:    Family History  Problem Relation Age of Onset  . Other Father     esophageal problems  . Heart failure Father   . Cirrhosis Father     Non alcoholic   . Stroke Father     Died in 41   . Breast cancer Maternal Aunt   . Colon cancer Neg Hx     Health Maintenance  Topic Date Due  . HIV Screening  11/20/1974  . INFLUENZA VACCINE  05/26/2017  . MAMMOGRAM  10/29/2018  . PAP SMEAR  07/10/2019  . TETANUS/TDAP  10/05/2021  . COLONOSCOPY  03/05/2025  . Hepatitis C Screening  Completed    No Known Allergies  Allergies as of 02/08/2017   No Known Allergies     Medication List       Accurate as of 02/08/17 12:29 PM. Always use your most recent med list.          acetaminophen 500 MG tablet Commonly known as:  TYLENOL Take 500 mg by mouth 3 (three) times daily.   amLODipine  5 MG tablet Commonly known as:  NORVASC Take 5 mg by mouth daily.   ANUSOL-HC 2.5 % rectal cream Generic drug:  hydrocortisone Place 1 application rectally 2 (two) times daily as needed for hemorrhoids or itching.   atorvastatin 20 MG tablet Commonly known as:  LIPITOR Take 20 mg by mouth at bedtime.   dimethicone-zinc oxide cream Apply 1 application three times a day prn to buttocks   docusate sodium 100 MG capsule Commonly known as:  COLACE Take 100 mg by mouth 2 (two) times daily.   ELIQUIS 2.5 MG Tabs tablet Generic drug:  apixaban Take 2.5 mg by mouth 2 (two) times daily.   escitalopram 5 MG tablet Commonly known as:  LEXAPRO Take 5 mg by mouth daily.   furosemide 20 MG tablet Commonly known as:  LASIX Take 40 mg by mouth.   guaiFENesin 100 MG/5ML Soln Commonly known as:  ROBITUSSIN Take 12.5 mLs by mouth every 6 (six) hours as needed for cough or to loosen phlegm.   ipratropium-albuterol 0.5-2.5 (3) MG/3ML  Soln Commonly known as:  DUONEB Take 3 mLs by nebulization every 6 (six) hours as needed.   isosorbide mononitrate 30 MG 24 hr tablet Commonly known as:  IMDUR Take 15 mg by mouth daily.   metoprolol tartrate 25 MG tablet Commonly known as:  LOPRESSOR Take 25 mg by mouth 2 (two) times daily.   NEOSPORIN ORIGINAL [phone] Oint Apply 1 application topically prn for wound care to eye   nystatin powder Generic drug:  nystatin Apply 1 g topically 3 (three) times daily. Apply to hip until healed   potassium chloride SA 20 MEQ tablet Commonly known as:  K-DUR,KLOR-CON Take 20 mEq by mouth daily.   ranitidine 150 MG tablet Commonly known as:  ZANTAC Take 150 mg by mouth every evening.   RISA-BID PROBIOTIC Tabs Give 1 tablet by mouth twice a day   traMADol 50 MG tablet Commonly known as:  ULTRAM Give 50 mg by mouth at bedtime and 1 tablet by mouth prn once a day   traZODone 50 MG tablet Commonly known as:  DESYREL Take 50 mg by mouth at bedtime.   vitamin B-12 100 MCG tablet Commonly known as:  CYANOCOBALAMIN Take 100 mcg by mouth daily. Give every Monday   Vitamin D (Cholecalciferol) 1000 units Caps Take 2,000 Units by mouth daily.       Review of Systems  Vitals:   02/08/17 1109  BP: 126/64  Pulse: 98  Resp: 20  Temp: 98 F (36.7 C)  TempSrc: Oral   There is no height or weight on file to calculate BMI. Physical Exam  Labs reviewed: Basic Metabolic Panel:  Recent Labs  07/02/16 0814  NA 139  K 3.7  CL 105  CO2 29  GLUCOSE 103*  BUN 18  CREATININE 0.65  CALCIUM 9.3   Liver Function Tests:  Recent Labs  07/02/16 0814  AST 23  ALT 33  ALKPHOS 57  BILITOT 0.3  PROT 7.0  ALBUMIN 4.2   No results for input(s): LIPASE, AMYLASE in the last 8760 hours. No results for input(s): AMMONIA in the last 8760 hours. CBC:  Recent Labs  07/02/16 0814  WBC 5.8  NEUTROABS 2.8  HGB 12.8  HCT 36.9  MCV 93.4  PLT 195.0   Cardiac Enzymes: No  results for input(s): CKTOTAL, CKMB, CKMBINDEX, TROPONINI in the last 8760 hours. BNP: Invalid input(s): POCBNP No results found for: HGBA1C Lab Results  Component Value Date  TSH 4.22 07/02/2016   No results found for: VITAMINB12 No results found for: FOLATE No results found for: IRON, TIBC, FERRITIN  Imaging and Procedures obtained prior to SNF admission: No results found.  Assessment/Plan There are no diagnoses linked to this encounter.   Family/ staff Communication:   Labs/tests ordered:

## 2017-02-08 NOTE — Progress Notes (Signed)
This encounter was created in error - please disregard.

## 2017-04-02 ENCOUNTER — Telehealth: Payer: Self-pay | Admitting: Adult Health

## 2017-04-02 NOTE — Telephone Encounter (Signed)
Since is has been since September when I last saw her, I will need her to come into the office for a visit before I can refer her.

## 2017-04-02 NOTE — Telephone Encounter (Signed)
Pt would like to see if you would refer her to a specialist for the pain she is having for her sinus area.

## 2017-04-05 NOTE — Telephone Encounter (Signed)
I called the pt and informed her of the message below.  Patient stated she is caring for her mother that is sick and will call back later for an appt.

## 2017-06-14 ENCOUNTER — Ambulatory Visit (INDEPENDENT_AMBULATORY_CARE_PROVIDER_SITE_OTHER): Payer: BLUE CROSS/BLUE SHIELD | Admitting: Adult Health

## 2017-06-14 ENCOUNTER — Encounter: Payer: Self-pay | Admitting: Adult Health

## 2017-06-14 VITALS — BP 100/66 | HR 85 | Ht 67.0 in | Wt 149.8 lb

## 2017-06-14 DIAGNOSIS — J329 Chronic sinusitis, unspecified: Secondary | ICD-10-CM

## 2017-06-14 DIAGNOSIS — Z0000 Encounter for general adult medical examination without abnormal findings: Secondary | ICD-10-CM | POA: Diagnosis not present

## 2017-06-14 NOTE — Assessment & Plan Note (Signed)
Continue to reduce-stop tobacco use. Continue excellent hydration with water/cranberry juice. Continue heart healthy diet and regular walking.  Denies need for medication RF at time of OV. Fasting labs drawn, we will call when results are available. She has CPE scheduled in 4 weeks. ENT referral placed.

## 2017-06-14 NOTE — Assessment & Plan Note (Signed)
Use Mometasone daily/consistently.  Do not use OTC Afrin. Continue excellent hydration and continue to reduce-stop tobacco use. ENT referral placed.

## 2017-06-14 NOTE — Patient Instructions (Signed)
Heart-Healthy Eating Plan Many factors influence your heart health, including eating and exercise habits. Heart (coronary) risk increases with abnormal blood fat (lipid) levels. Heart-healthy meal planning includes limiting unhealthy fats, increasing healthy fats, and making other small dietary changes. This includes maintaining a healthy body weight to help keep lipid levels within a normal range. What is my plan? Your health care provider recommends that you:  Get no more than __25__% of the total calories in your daily diet from fat.  Limit your intake of saturated fat to less than __5____% of your total calories each day.  Limit the amount of cholesterol in your diet to less than _300__ mg per day.  What types of fat should I choose?  Choose healthy fats more often. Choose monounsaturated and polyunsaturated fats, such as olive oil and canola oil, flaxseeds, walnuts, almonds, and seeds.  Eat more omega-3 fats. Good choices include salmon, mackerel, sardines, tuna, flaxseed oil, and ground flaxseeds. Aim to eat fish at least two times each week.  Limit saturated fats. Saturated fats are primarily found in animal products, such as meats, butter, and cream. Plant sources of saturated fats include palm oil, palm kernel oil, and coconut oil.  Avoid foods with partially hydrogenated oils in them. These contain trans fats. Examples of foods that contain trans fats are stick margarine, some tub margarines, cookies, crackers, and other baked goods. What general guidelines do I need to follow?  Check food labels carefully to identify foods with trans fats or high amounts of saturated fat.  Fill one half of your plate with vegetables and green salads. Eat 4-5 servings of vegetables per day. A serving of vegetables equals 1 cup of raw leafy vegetables,  cup of raw or cooked cut-up vegetables, or  cup of vegetable juice.  Fill one fourth of your plate with whole grains. Look for the word "whole"  as the first word in the ingredient list.  Fill one fourth of your plate with lean protein foods.  Eat 4-5 servings of fruit per day. A serving of fruit equals one medium whole fruit,  cup of dried fruit,  cup of fresh, frozen, or canned fruit, or  cup of 100% fruit juice.  Eat more foods that contain soluble fiber. Examples of foods that contain this type of fiber are apples, broccoli, carrots, beans, peas, and barley. Aim to get 20-30 g of fiber per day.  Eat more home-cooked food and less restaurant, buffet, and fast food.  Limit or avoid alcohol.  Limit foods that are high in starch and sugar.  Avoid fried foods.  Cook foods by using methods other than frying. Baking, boiling, grilling, and broiling are all great options. Other fat-reducing suggestions include: ? Removing the skin from poultry. ? Removing all visible fats from meats. ? Skimming the fat off of stews, soups, and gravies before serving them. ? Steaming vegetables in water or broth.  Lose weight if you are overweight. Losing just 5-10% of your initial body weight can help your overall health and prevent diseases such as diabetes and heart disease.  Increase your consumption of nuts, legumes, and seeds to 4-5 servings per week. One serving of dried beans or legumes equals  cup after being cooked, one serving of nuts equals 1 ounces, and one serving of seeds equals  ounce or 1 tablespoon.  You may need to monitor your salt (sodium) intake, especially if you have high blood pressure. Talk with your health care provider or dietitian to get  more information about reducing sodium. What foods can I eat? Grains  Breads, including [place], white, pita, wheat, raisin, rye, oatmeal, and [place]. Tortillas that are neither fried nor made with lard or trans fat. Low-fat rolls, including hotdog and hamburger buns and English muffins. Biscuits. Muffins. Waffles. Pancakes. Light popcorn. Whole-grain cereals. Flatbread. Melba  toast. Pretzels. Breadsticks. Rusks. Low-fat snacks and crackers, including oyster, saltine, matzo, graham, animal, and rye. Rice and pasta, including brown rice and those that are made with whole wheat. Vegetables All vegetables. Fruits All fruits, but limit coconut. Meats and Other Protein Sources Lean, well-trimmed beef, veal, pork, and lamb. Chicken and [place] without skin. All fish and shellfish. Wild duck, rabbit, pheasant, and venison. Egg whites or low-cholesterol egg substitutes. Dried beans, peas, lentils, and tofu.Seeds and most nuts. Dairy Low-fat or nonfat cheeses, including ricotta, string, and mozzarella. Skim or 1% milk that is liquid, powdered, or evaporated. Buttermilk that is made with low-fat milk. Nonfat or low-fat yogurt. Beverages Mineral water. Diet carbonated beverages. Sweets and Desserts Sherbets and fruit ices. Honey, jam, marmalade, jelly, and syrups. Meringues and gelatins. Pure sugar candy, such as hard candy, jelly beans, gumdrops, mints, marshmallows, and small amounts of dark chocolate. [employer] Eat all sweets and desserts in moderation. Fats and Oils Nonhydrogenated (trans-free) margarines. Vegetable oils, including soybean, sesame, sunflower, olive, peanut, safflower, corn, canola, and cottonseed. Salad dressings or mayonnaise that are made with a vegetable oil. Limit added fats and oils that you use for cooking, baking, salads, and as spreads. Other Cocoa powder. Coffee and tea. All seasonings and condiments. The items listed above may not be a complete list of recommended foods or beverages. Contact your dietitian for more options. What foods are not recommended? Grains Breads that are made with saturated or trans fats, oils, or whole milk. Croissants. Butter rolls. Cheese breads. Sweet rolls. Donuts. Buttered popcorn. Chow mein noodles. High-fat crackers, such as cheese or butter crackers. Meats and Other Protein Sources Fatty meats, such as  hotdogs, short ribs, sausage, spareribs, bacon, ribeye roast or steak, and mutton. High-fat deli meats, such as salami and bologna. Caviar. Domestic duck and goose. Organ meats, such as kidney, liver, sweetbreads, brains, gizzard, chitterlings, and heart. Dairy Cream, sour cream, cream cheese, and creamed cottage cheese. Whole milk cheeses, including blue (bleu), [place] Jack, [place], [place], American, [place], [place], [place], [place], and [place]. Whole or 2% milk that is liquid, evaporated, or condensed. Whole buttermilk. Cream sauce or high-fat cheese sauce. Yogurt that is made from whole milk. Beverages Regular sodas and drinks with added sugar. Sweets and Desserts Frosting. Pudding. Cookies. Cakes other than angel food cake. Candy that has milk chocolate or white chocolate, hydrogenated fat, butter, coconut, or unknown ingredients. Buttered syrups. Full-fat ice cream or ice cream drinks. Fats and Oils Gravy that has suet, meat fat, or shortening. Cocoa butter, hydrogenated oils, palm oil, coconut oil, palm kernel oil. These can often be found in baked products, candy, fried foods, nondairy creamers, and whipped toppings. Solid fats and shortenings, including bacon fat, salt pork, lard, and butter. Nondairy cream substitutes, such as coffee creamers and sour cream substitutes. Salad dressings that are made of unknown oils, cheese, or sour cream. The items listed above may not be a complete list of foods and beverages to avoid. Contact your dietitian for more information. This information is not intended to replace advice given to you by your health care provider. Make sure you discuss any questions you have with your health care  provider. Document Released: 07/21/2008 Document Revised: 05/01/2016 Document Reviewed: 04/05/2014 Elsevier Interactive Patient Education  2017 [employer] Inc.   Sinusitis, Adult Sinusitis is soreness and inflammation of your sinuses. Sinuses are hollow spaces in the  bones around your face. Your sinuses are located:  Around your eyes.  In the middle of your forehead.  Behind your nose.  In your cheekbones.  Your sinuses and nasal passages are lined with a stringy fluid (mucus). Mucus normally drains out of your sinuses. When your nasal tissues become inflamed or swollen, the mucus can become trapped or blocked so air cannot flow through your sinuses. This allows bacteria, viruses, and funguses to grow, which leads to infection. Sinusitis can develop quickly and last for 7?10 days (acute) or for more than 12 weeks (chronic). Sinusitis often develops after a cold. What are the causes? This condition is caused by anything that creates swelling in the sinuses or stops mucus from draining, including:  Allergies.  Asthma.  Bacterial or viral infection.  Abnormally shaped bones between the nasal passages.  Nasal growths that contain mucus (nasal polyps).  Narrow sinus openings.  Pollutants, such as chemicals or irritants in the air.  A foreign object stuck in the nose.  A fungal infection. This is rare.  What increases the risk? The following factors may make you more likely to develop this condition:  Having allergies or asthma.  Having had a recent cold or respiratory tract infection.  Having structural deformities or blockages in your nose or sinuses.  Having a weak immune system.  Doing a lot of swimming or diving.  Overusing nasal sprays.  Smoking.  What are the signs or symptoms? The main symptoms of this condition are pain and a feeling of pressure around the affected sinuses. Other symptoms include:  Upper toothache.  Earache.  Headache.  Bad breath.  Decreased sense of smell and taste.  A cough that may get worse at night.  Fatigue.  Fever.  Thick drainage from your nose. The drainage is often green and it may contain pus (purulent).  Stuffy nose or congestion.  Postnasal drip. This is when extra mucus  collects in the throat or back of the nose.  Swelling and warmth over the affected sinuses.  Sore throat.  Sensitivity to light.  How is this diagnosed? This condition is diagnosed based on symptoms, a medical history, and a physical exam. To find out if your condition is acute or chronic, your health care provider may:  Look in your nose for signs of nasal polyps.  Tap over the affected sinus to check for signs of infection.  View the inside of your sinuses using an imaging device that has a light attached (endoscope).  If your health care provider suspects that you have chronic sinusitis, you may also:  Be tested for allergies.  Have a sample of mucus taken from your nose (nasal culture) and checked for bacteria.  Have a mucus sample examined to see if your sinusitis is related to an allergy.  If your sinusitis does not respond to treatment and it lasts longer than 8 weeks, you may have an MRI or CT scan to check your sinuses. These scans also help to determine how severe your infection is. In rare cases, a bone biopsy may be done to rule out more serious types of fungal sinus disease. How is this treated? Treatment for sinusitis depends on the cause and whether your condition is chronic or acute. If a virus is causing your  sinusitis, your symptoms will go away on their own within 10 days. You may be given medicines to relieve your symptoms, including:  Topical nasal decongestants. They shrink swollen nasal passages and let mucus drain from your sinuses.  Antihistamines. These drugs block inflammation that is triggered by allergies. This can help to ease swelling in your nose and sinuses.  Topical nasal corticosteroids. These are nasal sprays that ease inflammation and swelling in your nose and sinuses.  Nasal saline washes. These rinses can help to get rid of thick mucus in your nose.  If your condition is caused by bacteria, you will be given an antibiotic medicine. If your  condition is caused by a fungus, you will be given an antifungal medicine. Surgery may be needed to correct underlying conditions, such as narrow nasal passages. Surgery may also be needed to remove polyps. Follow these instructions at home: Medicines  Take, use, or apply over-the-counter and prescription medicines only as told by your health care provider. These may include nasal sprays.  If you were prescribed an antibiotic medicine, take it as told by your health care provider. Do not stop taking the antibiotic even if you start to feel better. Hydrate and Humidify  Drink enough water to keep your urine clear or pale yellow. Staying hydrated will help to thin your mucus.  Use a cool mist humidifier to keep the humidity level in your home above 50%.  Inhale steam for 10-15 minutes, 3-4 times a day or as told by your health care provider. You can do this in the bathroom while a hot shower is running.  Limit your exposure to cool or dry air. Rest  Rest as much as possible.  Sleep with your head raised (elevated).  Make sure to get enough sleep each night. General instructions  Apply a warm, moist washcloth to your face 3-4 times a day or as told by your health care provider. This will help with discomfort.  Wash your hands often with soap and water to reduce your exposure to viruses and other germs. If soap and water are not available, use hand sanitizer.  Do not smoke. Avoid being around people who are smoking (secondhand smoke).  Keep all follow-up visits as told by your health care provider. This is important. Contact a health care provider if:  You have a fever.  Your symptoms get worse.  Your symptoms do not improve within 10 days. Get help right away if:  You have a severe headache.  You have persistent vomiting.  You have pain or swelling around your face or eyes.  You have vision problems.  You develop confusion.  Your neck is stiff.  You have trouble  breathing. This information is not intended to replace advice given to you by your health care provider. Make sure you discuss any questions you have with your health care provider. Document Released: 10/12/2005 Document Revised: 06/07/2016 Document Reviewed: 08/07/2015 Elsevier Interactive Patient Education  2017 [place] excellent water/cranberry juice consumption.  Continue hearth healthy diet and regular walking.  We will call when lab results are available. Please keep physical appt for next month. ENT referral placed. We are so sorry for the loss of your cousin. WELCOME TO THE PRACTICE!

## 2017-06-14 NOTE — Progress Notes (Signed)
Subjective:    Patient ID: Felicia Good, female    DOB: [birth year], 56 y.o.   MRN: [id number]  HPI:  Felicia Good is here to establish as a new pt.  She is a very pleasant 56 year old female.  PMH:  Depression, anxiety, chronic sinusitis, and chronic lumbar back pain.  She estimates to smoke a pack of cigarettes every 2-3 days.  She denies EOTH use and hydrates with water/cranbery juice.  She follows heart healthy diet and walks daily.  She has had chronic sinusitis since age 63 and currently is having clear nasal drainage with maxillary pressure.  She denies CP/dyspnea/palpitations.  She has intermittent HAs, that last 1-5 days and of the occur when "allergies flare".  She intermittently uses mometasone, OTC Afrin and reports that OTC antihistamine's "don't really work well".  She takes care of several family members and her cousin passed away yesterday from complications of HIV.  She denies thoughts of harming herself/others.  Patient Care Team    Relationship Specialty Notifications Start End  Mina Marble D, NP PCP - General Family Medicine  06/14/17   Sanjuana Kava, MD Referring Physician Obstetrics and Gynecology  06/14/17     Patient Active Problem List   Diagnosis Date Noted  . Chronic sinusitis 06/14/2017  . Healthcare maintenance 06/14/2017  . PANIC DISORDER,NO AGORAPHOBIA 10/13/2010  . TOBACCO ABUSE 08/13/2008  . BACK PAIN 01/19/2008  . URTICARIA NOS 08/15/2007  . Depression 06/15/2007     Past Medical History:  Diagnosis Date  . Anxiety   . Depression   . IBS (irritable bowel syndrome)   . Migraine headache   . Mitral valve prolapse   . Panic attacks   . Rectal polyp   . RLS (restless legs syndrome)      Past Surgical History:  Procedure Laterality Date  . rectal polyp removed  1987   Local anesthesia used  . RECTAL POLYPECTOMY       Family History  Problem Relation Age of Onset  . Other Father        esophageal problems  . Heart failure Father   .  Cirrhosis Father        Non alcoholic   . Stroke Father        Died in 64   . Hypertension Father   . Hypertension Mother   . Stroke Mother   . Dementia Mother   . Breast cancer Maternal Aunt   . Colon cancer Neg Hx      History  Drug Use No     History  Alcohol Use  . 0.0 oz/week    Comment: occ     History  Smoking Status  . Current Every Day Smoker  . Packs/day: 0.25  . Years: 40.00  . Types: Cigarettes  Smokeless Tobacco  . Never Used     Outpatient Encounter Prescriptions as of 06/14/2017  Medication Sig  . CITALOPRAM HYDROBROMIDE PO Take 40 mg by mouth daily.  . clonazePAM (KLONOPIN) 0.5 MG tablet Take 0.5 mg by mouth at bedtime as needed for anxiety.  . cyclobenzaprine (FLEXERIL) 10 MG tablet Take 10 mg by mouth 3 (three) times daily as needed for muscle spasms.  . mometasone (NASONEX) 50 MCG/ACT nasal spray Place 1 spray into the nose 2 (two) times daily.  . Prenatal Vit-Fe Fumarate-FA (PRENATAL VITAMIN PO) Take 1 tablet by mouth daily.  . traMADol (ULTRAM) 50 MG tablet Take 50 mg by mouth every 8 (eight) hours as  needed.  . TRIAMCINOLONE ACETONIDE EX 1 application Topical 2 times daily   No facility-administered encounter medications on file as of 06/14/2017.     Allergies: Patient has no known allergies.  Body mass index is 23.46 kg/m.  Blood pressure 100/66, pulse 85, height 5\' 7"  (1.702 m), weight 149 lb 12.8 oz (67.9 kg), last menstrual period 11/26/2013.     Review of Systems  Constitutional: Positive for fatigue. Negative for activity change, appetite change, chills, diaphoresis, fever and unexpected weight change.  HENT: Positive for congestion, sinus pressure and sneezing.   Eyes: Negative for visual disturbance.  Respiratory: Negative for cough, chest tightness, shortness of breath, wheezing and stridor.   Cardiovascular: Negative for chest pain, palpitations and leg swelling.  Gastrointestinal: Negative for abdominal distention,  abdominal pain, blood in stool, constipation, diarrhea, nausea and vomiting.  Endocrine: Negative for cold intolerance, heat intolerance, polydipsia, polyphagia and polyuria.  Genitourinary: Negative for difficulty urinating, flank pain and hematuria.  Musculoskeletal: Positive for arthralgias, back pain and myalgias. Negative for gait problem, joint swelling, neck pain and neck stiffness.  Skin: Negative for color change, pallor, rash and wound.  Allergic/Immunologic: Negative for immunocompromised state.  Neurological: Positive for headaches. Negative for dizziness, tremors and weakness.  Hematological: Does not bruise/bleed easily.  Psychiatric/Behavioral: Positive for dysphoric mood. Negative for confusion, decreased concentration, self-injury, sleep disturbance and suicidal ideas. The patient is not nervous/anxious and is not hyperactive.        Objective:   Physical Exam  Constitutional: She is oriented to person, place, and time. She appears well-developed and well-nourished. No distress.  HENT:  Head: Normocephalic and atraumatic.  Right Ear: Hearing, external ear and ear canal normal. Tympanic membrane is bulging. Tympanic membrane is not erythematous. No decreased hearing is noted.  Left Ear: Hearing, external ear and ear canal normal. Tympanic membrane is bulging. Tympanic membrane is not erythematous. No decreased hearing is noted.  Nose: Mucosal edema present. No sinus tenderness. Right sinus exhibits maxillary sinus tenderness. Right sinus exhibits no frontal sinus tenderness. Left sinus exhibits maxillary sinus tenderness. Left sinus exhibits no frontal sinus tenderness.  Mouth/Throat: Uvula is midline, oropharynx is clear and moist and mucous membranes are normal.  Eyes: Pupils are equal, round, and reactive to light. Conjunctivae are normal.  Neck: Normal range of motion. Neck supple.  Cardiovascular: Normal rate, regular rhythm and intact distal pulses.   No murmur  heard. Pulmonary/Chest: Effort normal and breath sounds normal. No respiratory distress. She has no wheezes. She has no rales. She exhibits no tenderness.  Musculoskeletal: Normal range of motion.  Lymphadenopathy:    She has no cervical adenopathy.  Neurological: She is alert and oriented to person, place, and time. Coordination normal.  Skin: Skin is warm and dry. No rash noted. She is not diaphoretic. No erythema. No pallor.  Psychiatric: She has a normal mood and affect. Her behavior is normal. Judgment and thought content normal.  Nursing note and vitals reviewed.         Assessment & Plan:   1. Healthcare maintenance   2. Chronic sinusitis, unspecified location     Chronic sinusitis Use Mometasone daily/consistently.  Do not use OTC Afrin. Continue excellent hydration and continue to reduce-stop tobacco use. ENT referral placed.  Healthcare maintenance Continue to reduce-stop tobacco use. Continue excellent hydration with water/cranberry juice. Continue heart healthy diet and regular walking.  Denies need for medication RF at time of OV. Fasting labs drawn, we will call when results are available.  She has CPE scheduled in 4 weeks. ENT referral placed.      FOLLOW-UP:  Return in about 4 weeks (around 07/12/2017) for CPE.

## 2017-06-15 ENCOUNTER — Ambulatory Visit: Payer: BLUE CROSS/BLUE SHIELD | Admitting: Adult Health

## 2017-06-15 ENCOUNTER — Encounter (HOSPITAL_COMMUNITY): Payer: Self-pay | Admitting: Emergency Medicine

## 2017-06-15 ENCOUNTER — Emergency Department (HOSPITAL_COMMUNITY)
Admission: EM | Admit: 2017-06-15 | Discharge: 2017-06-15 | Disposition: A | Discharge status: Home / Self Care | Payer: BLUE CROSS/BLUE SHIELD | Attending: Emergency Medicine | Admitting: Emergency Medicine

## 2017-06-15 ENCOUNTER — Telehealth: Payer: Self-pay

## 2017-06-15 DIAGNOSIS — F410 Panic disorder [episodic paroxysmal anxiety] without agoraphobia: Secondary | ICD-10-CM | POA: Insufficient documentation

## 2017-06-15 DIAGNOSIS — Z79899 Other long term (current) drug therapy: Secondary | ICD-10-CM | POA: Diagnosis not present

## 2017-06-15 DIAGNOSIS — F419 Anxiety disorder, unspecified: Secondary | ICD-10-CM | POA: Diagnosis not present

## 2017-06-15 DIAGNOSIS — R6884 Jaw pain: Secondary | ICD-10-CM

## 2017-06-15 DIAGNOSIS — F17210 Nicotine dependence, cigarettes, uncomplicated: Secondary | ICD-10-CM | POA: Insufficient documentation

## 2017-06-15 LAB — COMPREHENSIVE METABOLIC PANEL
A/G RATIO: 1.7 (ref 1.2–2.2)
ALK PHOS: 76 IU/L (ref 39–117)
ALT: 74 IU/L — AB (ref 0–32)
ALT: 64 U/L — ABNORMAL HIGH (ref 14–54)
AST: 62 IU/L — AB (ref 0–40)
AST: 41 U/L (ref 15–41)
Albumin: 4.3 g/dL (ref 3.5–5.5)
Albumin: 4 g/dL (ref 3.5–5.0)
Alkaline Phosphatase: 66 U/L (ref 38–126)
Anion gap: 7 (ref 5–15)
BILIRUBIN TOTAL: 0.5 mg/dL (ref 0.3–1.2)
BUN / CREAT RATIO: 26 — AB (ref 9–23)
BUN: 17 mg/dL (ref 6–24)
BUN: 18 mg/dL (ref 6–20)
Bilirubin Total: 0.3 mg/dL (ref 0.0–1.2)
CALCIUM: 9.7 mg/dL (ref 8.7–10.2)
CO2: 20 mmol/L (ref 20–29)
CO2: 28 mmol/L (ref 22–32)
CREATININE: 0.65 mg/dL (ref 0.57–1.00)
Calcium: 9.7 mg/dL (ref 8.9–10.3)
Chloride: 105 mmol/L (ref 96–106)
Chloride: 106 mmol/L (ref 101–111)
Creatinine, Ser: 0.61 mg/dL (ref 0.44–1.00)
GFR calc non Af Amer: >60 mL/min (ref >60)
GFR, EST AFRICAN AMERICAN: 114 mL/min/{1.73_m2} (ref >59)
GFR, EST NON AFRICAN AMERICAN: 99 mL/min/{1.73_m2} (ref >59)
GLOBULIN, TOTAL: 2.6 g/dL (ref 1.5–4.5)
Glucose, Bld: 113 mg/dL — ABNORMAL HIGH (ref 65–99)
Glucose: 96 mg/dL (ref 65–99)
POTASSIUM: 4.1 mmol/L (ref 3.5–5.1)
Potassium: 4 mmol/L (ref 3.5–5.2)
Sodium: 141 mmol/L (ref 134–144)
Sodium: 141 mmol/L (ref 135–145)
TOTAL PROTEIN: 7.2 g/dL (ref 6.5–8.1)
Total Protein: 6.9 g/dL (ref 6.0–8.5)

## 2017-06-15 LAB — CBC WITH DIFFERENTIAL/PLATELET
BASOS: 0 %
Basophils Absolute: 0 10*3/uL (ref 0.0–0.2)
EOS (ABSOLUTE): 0.2 10*3/uL (ref 0.0–0.4)
Eos: 2 %
Hematocrit: 37.4 % (ref 34.0–46.6)
Hemoglobin: 12.7 g/dL (ref 11.1–15.9)
IMMATURE GRANULOCYTES: 0 %
Immature Grans (Abs): 0 10*3/uL (ref 0.0–0.1)
LYMPHS ABS: 2 10*3/uL (ref 0.7–3.1)
Lymphs: 29 %
MCH: 31.8 pg (ref 26.6–33.0)
MCHC: 34 g/dL (ref 31.5–35.7)
MCV: 94 fL (ref 79–97)
MONOS ABS: 0.4 10*3/uL (ref 0.1–0.9)
Monocytes: 5 %
NEUTROS PCT: 64 %
Neutrophils Absolute: 4.4 10*3/uL (ref 1.4–7.0)
Platelets: 234 10*3/uL (ref 150–379)
RBC: 4 x10E6/uL (ref 3.77–5.28)
RDW: 14.6 % (ref 12.3–15.4)
WBC: 7 10*3/uL (ref 3.4–10.8)

## 2017-06-15 LAB — CBC
HEMATOCRIT: 37.9 % (ref 36.0–46.0)
Hemoglobin: 12.8 g/dL (ref 12.0–15.0)
MCH: 31.1 pg (ref 26.0–34.0)
MCHC: 33.8 g/dL (ref 30.0–36.0)
MCV: 92.2 fL (ref 78.0–100.0)
PLATELETS: 202 10*3/uL (ref 150–400)
RBC: 4.11 MIL/uL (ref 3.87–5.11)
RDW: 14.2 % (ref 11.5–15.5)
WBC: 5.8 10*3/uL (ref 4.0–10.5)

## 2017-06-15 LAB — LIPID PANEL
CHOL/HDL RATIO: 4.5 ratio — AB (ref 0.0–4.4)
CHOLESTEROL TOTAL: 252 mg/dL — AB (ref 100–199)
HDL: 56 mg/dL (ref >39)
LDL CALC: 172 mg/dL — AB (ref 0–99)
TRIGLYCERIDES: 119 mg/dL (ref 0–149)
VLDL Cholesterol Cal: 24 mg/dL (ref 5–40)

## 2017-06-15 LAB — TSH: TSH: 2.15 u[IU]/mL (ref 0.450–4.500)

## 2017-06-15 LAB — LIPASE, BLOOD: LIPASE: 35 U/L (ref 11–51)

## 2017-06-15 LAB — HEMOGLOBIN A1C
Est. average glucose Bld gHb Est-mCnc: 108 mg/dL
Hgb A1c MFr Bld: 5.4 % (ref 4.8–5.6)

## 2017-06-15 LAB — VITAMIN D 25 HYDROXY (VIT D DEFICIENCY, FRACTURES): Vit D, 25-Hydroxy: 28.3 ng/mL — ABNORMAL LOW (ref 30.0–100.0)

## 2017-06-15 LAB — I-STAT TROPONIN, ED: Troponin i, poc: 0 ng/mL (ref 0.00–0.08)

## 2017-06-15 MED ORDER — ONDANSETRON 4 MG PO TBDP: 4.0000 mg | ORAL_TABLET | Freq: Once | ORAL | Status: DC | PRN

## 2017-06-15 NOTE — Telephone Encounter (Signed)
Called Felicia Good after realizing that she was added to our schedule today for jaw pain with nausea and vomiting.  Felicia Good states that additionally she has had some episodes of becoming "hot" and breaking out in a sweat.  Denies neck pain, arm pain, chest pain and shortness of breath.  Felicia Good does state that she "feels anxious".  Per Mina Marble, NP Felicia Good was instructed to go directly to the ED and that she is not to drive.  Felicia Good questioned whether she should go she her dentist first.  Advised Felicia Good that she needs to be seen in the ED first to r/o a cardiac event and then we can proceed with further investigation should all cardiac evaluations be negative.  Felicia Good expressed understanding and states that her husband is on his way home and she will have him drive her to the ED.  Charyl Bigger, CMA

## 2017-06-15 NOTE — ED Triage Notes (Addendum)
Patient c/o nausea, diarrhea, jaw pain on lower bilat sides. Patient states that she thought she was just having stress due to a lot going on in life so took Clonopin and reports didn't relieve any of her symptoms.  Patient reports that symptoms have gotten better since getting here but states that has tightness when opening mouth.  Patient reports that she had blood work done at her PCP yesterday and was told that her results are bad, patient believes her cholesterol.

## 2017-06-15 NOTE — ED Provider Notes (Signed)
[facility] DEPT Provider Note   CSN: [id number] Arrival date & time: 06/15/17  1000     History   Chief Complaint Chief Complaint  Patient presents with  . Jaw Pain  . Nausea  . Diarrhea    HPI Felicia Good is a 56 y.o. female.  HPI  Pt presenting with c/o bilateral jaw pain and anxiety.  She states she woke up and both side of her jaws were very sore. She states she began to feel anxious about this after waking up and felt nauseated, felt very panicky and hot.  No chest pain no shortness of breath.  She called her doctor and was advised to come to the ED.  In the ED she states her symptoms have improved.  She took klonopin at home and after a while it started to help her symptoms.  She has no difficulty opening her mouth.  No difficulty swallowing.  There are no other associated systemic symptoms, there are no other alleviating or modifying factors.   Past Medical History:  Diagnosis Date  . Anxiety   . Depression   . IBS (irritable bowel syndrome)   . Migraine headache   . Mitral valve prolapse   . Panic attacks   . Rectal polyp   . RLS (restless legs syndrome)     Patient Active Problem List   Diagnosis Date Noted  . Chronic sinusitis 06/14/2017  . Healthcare maintenance 06/14/2017  . PANIC DISORDER,NO AGORAPHOBIA 10/13/2010  . TOBACCO ABUSE 08/13/2008  . BACK PAIN 01/19/2008  . URTICARIA NOS 08/15/2007  . Depression 06/15/2007    Past Surgical History:  Procedure Laterality Date  . rectal polyp removed  1987   Local anesthesia used  . RECTAL POLYPECTOMY      OB History    No data available       Home Medications    Prior to Admission medications   Medication Sig Start Date End Date Taking? Authorizing Provider  CITALOPRAM HYDROBROMIDE PO Take 40 mg by mouth daily.    [provider]  clonazePAM (KLONOPIN) 0.5 MG tablet Take 0.5 mg by mouth at bedtime as needed for anxiety.    [provider]  cyclobenzaprine (FLEXERIL) 10 MG  tablet Take 10 mg by mouth 3 (three) times daily as needed for muscle spasms.    [provider]  mometasone (NASONEX) 50 MCG/ACT nasal spray Place 1 spray into the nose 2 (two) times daily.    [provider]  Prenatal Vit-Fe Fumarate-FA (PRENATAL VITAMIN PO) Take 1 tablet by mouth daily.    [provider]  traMADol (ULTRAM) 50 MG tablet Take 50 mg by mouth every 8 (eight) hours as needed.    [provider]  TRIAMCINOLONE ACETONIDE EX 1 application Topical 2 times daily    [provider]    Family History Family History  Problem Relation Age of Onset  . Other Father        esophageal problems  . Heart failure Father   . Cirrhosis Father        Non alcoholic   . Stroke Father        Died in 10   . Hypertension Father   . Hypertension Mother   . Stroke Mother   . Dementia Mother   . Breast cancer Maternal Aunt   . Colon cancer Neg Hx     Social History Social History  Substance Use Topics  . Smoking status: Current Every Day Smoker  Packs/day: 0.25    Years: 40.00    Types: Cigarettes  . Smokeless tobacco: Never Used  . Alcohol use 0.0 oz/week     Comment: occ     Allergies   Patient has no known allergies.   Review of Systems Review of Systems  ROS reviewed and all otherwise negative except for mentioned in HPI   Physical Exam Updated Vital Signs BP 93/63   Pulse 75   Temp 97.7 F (36.5 C) (Oral)   Resp 10   Ht 5\' 7"  (1.702 m)   Wt 67.6 kg (149 lb)   LMP 11/26/2013   SpO2 100%   BMI 23.34 kg/m  Vitals reviewed Physical Exam  Physical Examination: General appearance - alert, well appearing, and in no distress Mental status - alert, oriented to person, place, and time Eyes - no conjunctival injection, no scleral icterus Mouth - mucous membranes moist, pharynx normal without lesions, no trismus, no pain with bite or movement of jaw Neck - supple, no significant adenopathy Chest - clear to auscultation,  no wheezes, rales or rhonchi, symmetric air entry Heart - normal rate, regular rhythm, normal S1, S2, no murmurs, rubs, clicks or gallops Abdomen - soft, nontender, nondistended, no masses or organomegaly Neurological - alert, oriented x 3, normal speech, no cranial nerve defect,  Extremities - peripheral pulses normal, no pedal edema, no clubbing or cyanosis Skin - normal coloration and turgor, no rashes Psych- anxious about symptoms, calm and cooperative   ED Treatments / Results  Labs (all labs ordered are listed, but only abnormal results are displayed) Labs Reviewed  COMPREHENSIVE METABOLIC PANEL - Abnormal; Notable for the following:       Result Value   Glucose, Bld 113 (*)    ALT 64 (*)    All other components within normal limits  LIPASE, BLOOD  CBC  URINALYSIS, ROUTINE W REFLEX MICROSCOPIC  I-STAT TROPONIN, ED    EKG  EKG Interpretation  Date/Time:  Tuesday June 15 2017 16:46:37 EDT Ventricular Rate:  66 PR Interval:  158 QRS Duration: 92 QT Interval:  412 QTC Calculation: 431 R Axis:   45 Text Interpretation:  Normal sinus rhythm Normal ECG No old tracing to compare Confirmed by Alfonzo Beers [phone] on 06/15/2017 4:57:37 PM       Radiology No results found.  Procedures Procedures (including critical care time)  Medications Ordered in ED Medications  ondansetron (ZOFRAN-ODT) disintegrating tablet 4 mg (not administered)     Initial Impression / Assessment and Plan / ED Course  I have reviewed the triage vital signs and the nursing notes.  Pertinent labs & imaging results that were available during my care of the patient were reviewed by me and considered in my medical decision making (see chart for details).    4:14 PM went to see patient, she is not in room  Pt presenting with c/o bilateral jaw pain upon wakening this morning.  Pt had a lot of anxiety about her symptoms which resolved after taking klonopin.  EKG and troponin negative- doubt  this is ACS.  More likely bruxism- she states she has talked with her dentist about getting a bite guard in the past.  LFTS which were abnormal at PMD visit yesterday are largely reassuring today.  Discharged with strict return precautions.  Pt agreeable with plan.  Final Clinical Impressions(s) / ED Diagnoses   Final diagnoses:  Jaw pain  Anxiety    New Prescriptions Discharge Medication List as of 06/15/2017  5:22  PM       Felix Meras, Forbes Cellar, MD 06/15/17 [phone]

## 2017-06-15 NOTE — Discharge Instructions (Signed)
Return to the ED with any concerns including difficulty breathing, chest pain, abdominal pain, nausea and vomiting, difficulty swalllowing or breathing, decreased level of alertness/lethargy, or any other alarming symptoms

## 2017-07-05 DIAGNOSIS — F17210 Nicotine dependence, cigarettes, uncomplicated: Secondary | ICD-10-CM | POA: Diagnosis not present

## 2017-07-05 DIAGNOSIS — J321 Chronic frontal sinusitis: Secondary | ICD-10-CM | POA: Diagnosis not present

## 2017-07-14 NOTE — Progress Notes (Signed)
Subjective:    Patient ID: Felicia Good, female    DOB: [birth year], 56 y.o.   MRN: [id number]  HPI: 06/14/2017 OV Note: Felicia Good is here to establish as a new pt.  She is a very pleasant 56 year old female.  PMH:  Depression, anxiety, chronic sinusitis, and chronic lumbar back pain.  She estimates to smoke a pack of cigarettes every 2-3 days.  She denies EOTH use and hydrates with water/cranbery juice.  She follows heart healthy diet and walks daily.  She has had chronic sinusitis since age 63 and currently is having clear nasal drainage with maxillary pressure.  She denies CP/dyspnea/palpitations.  She has intermittent HAs, that last 1-5 days and of the occur when "allergies flare".  She intermittently uses mometasone, OTC Afrin and reports that OTC antihistamine's "don't really work well".  She takes care of several family members and her cousin passed away yesterday from complications of HIV.  She denies thoughts of harming herself/others.  Today's OV: Felicia Good is here for CPE.  She was seen in ED 06/15/17 for jaw pain and acute anxiety r/t to death of her cousin.  She continues to have anxiety/depression r/t to life stressors-care giver for 55 year old mother and 41month granddaughter [id number].  She reports medication compliance and denies SE.  She continues to drink >60 ounces water/day, follows heart healthy diet.  Weather permitting, she walks hr/day and dances with her granddaughter.  She is planning on resuming a yoga practice 1-2 times a week.  She and her husband have recently started "date nights" at least every 2 weeks-GREAT! We discussed labs at length and will re-check hepatic panel today since LFTS elevated at last draw.  He father had non-alcoholic cirrhosis of the liver.  She denies Acetaminophen/ETOH use. She recently established with ENT a few weeks ago and will have CT of head soon, re: chronic sinusitis that is resistant to traditional tx.  Patient Care Team   Relationship Specialty Notifications Start End  Mina Marble D, NP PCP - General Family Medicine  06/14/17   Sanjuana Kava, MD Referring Physician Obstetrics and Gynecology  06/14/17     Patient Active Problem List   Diagnosis Date Noted  . Elevated liver function tests 07/15/2017  . Hyperlipidemia 07/15/2017  . Anxiety 07/15/2017  . Chronic sinusitis 06/14/2017  . Healthcare maintenance 06/14/2017  . PANIC DISORDER,NO AGORAPHOBIA 10/13/2010  . TOBACCO ABUSE 08/13/2008  . BACK PAIN 01/19/2008  . URTICARIA NOS 08/15/2007  . Depression 06/15/2007     Past Medical History:  Diagnosis Date  . Anxiety   . Depression   . IBS (irritable bowel syndrome)   . Migraine headache   . Mitral valve prolapse   . Panic attacks   . Rectal polyp   . RLS (restless legs syndrome)      Past Surgical History:  Procedure Laterality Date  . rectal polyp removed  1987   Local anesthesia used  . RECTAL POLYPECTOMY       Family History  Problem Relation Age of Onset  . Other Father        esophageal problems  . Heart failure Father   . Cirrhosis Father        Non alcoholic   . Stroke Father        Died in 81   . Hypertension Father   . Hypertension Mother   . Stroke Mother   . Dementia Mother   . Breast cancer Maternal Aunt   .  Colon cancer Neg Hx      History  Drug Use No     History  Alcohol Use  . 0.0 oz/week    Comment: occ     History  Smoking Status  . Current Every Day Smoker  . Packs/day: 0.25  . Years: 40.00  . Types: Cigarettes  Smokeless Tobacco  . Never Used     Outpatient Encounter Prescriptions as of 07/15/2017  Medication Sig  . Cholecalciferol (VITAMIN D3) 2000 units TABS Take 1 tablet by mouth daily.  . citalopram (CELEXA) 40 MG tablet Take 1 tablet (40 mg total) by mouth daily.  . clindamycin (CLEOCIN) 300 MG capsule Take 300 mg by mouth 3 (three) times daily.  . clonazePAM (KLONOPIN) 0.5 MG tablet Take 1 tablet (0.5 mg total) by mouth at  bedtime as needed for anxiety.  . cyclobenzaprine (FLEXERIL) 10 MG tablet Take 10 mg by mouth 3 (three) times daily as needed for muscle spasms.  . mometasone (NASONEX) 50 MCG/ACT nasal spray Place 1 spray into the nose 2 (two) times daily.  . Prenatal Vit-Fe Fumarate-FA (PRENATAL VITAMIN PO) Take 1 tablet by mouth daily.  . TRIAMCINOLONE ACETONIDE EX 1 application Topical 2 times daily  . [DISCONTINUED] CITALOPRAM HYDROBROMIDE PO Take 40 mg by mouth daily.  . [DISCONTINUED] clonazePAM (KLONOPIN) 0.5 MG tablet Take 0.5 mg by mouth at bedtime as needed for anxiety.  . [DISCONTINUED] traMADol (ULTRAM) 50 MG tablet Take 50 mg by mouth every 8 (eight) hours as needed.   No facility-administered encounter medications on file as of 07/15/2017.     Allergies: Patient has no known allergies.  Body mass index is 23.43 kg/m.  Blood pressure 96/63, pulse 81, height 5\' 7"  (1.702 m), weight 149 lb 9.6 oz (67.9 kg), last menstrual period 11/26/2013.     Review of Systems  Constitutional: Positive for fatigue. Negative for activity change, appetite change, chills, diaphoresis, fever and unexpected weight change.  HENT: Positive for congestion, sinus pressure and sneezing.   Eyes: Negative for visual disturbance.  Respiratory: Negative for cough, chest tightness, shortness of breath, wheezing and stridor.   Cardiovascular: Negative for chest pain, palpitations and leg swelling.  Gastrointestinal: Negative for abdominal distention, abdominal pain, blood in stool, constipation, diarrhea, nausea and vomiting.  Endocrine: Negative for cold intolerance, heat intolerance, polydipsia, polyphagia and polyuria.  Genitourinary: Negative for difficulty urinating, flank pain and hematuria.  Musculoskeletal: Positive for arthralgias, back pain and myalgias. Negative for gait problem, joint swelling, neck pain and neck stiffness.  Skin: Negative for color change, pallor, rash and wound.  Allergic/Immunologic:  Negative for immunocompromised state.  Neurological: Positive for headaches. Negative for dizziness, tremors and weakness.  Hematological: Does not bruise/bleed easily.  Psychiatric/Behavioral: Positive for dysphoric mood. Negative for confusion, decreased concentration, self-injury, sleep disturbance and suicidal ideas. The patient is not nervous/anxious and is not hyperactive.        Objective:   Physical Exam  Constitutional: She is oriented to person, place, and time. She appears well-developed and well-nourished. No distress.  HENT:  Head: Normocephalic and atraumatic.  Right Ear: Hearing, external ear and ear canal normal. Tympanic membrane is bulging. Tympanic membrane is not erythematous. No decreased hearing is noted.  Left Ear: Hearing, external ear and ear canal normal. Tympanic membrane is bulging. Tympanic membrane is not erythematous. No decreased hearing is noted.  Nose: Mucosal edema present. No sinus tenderness. Right sinus exhibits maxillary sinus tenderness. Right sinus exhibits no frontal sinus tenderness. Left sinus exhibits  maxillary sinus tenderness. Left sinus exhibits no frontal sinus tenderness.  Mouth/Throat: Uvula is midline, oropharynx is clear and moist and mucous membranes are normal.  Eyes: Pupils are equal, round, and reactive to light. Conjunctivae are normal.  Neck: Normal range of motion. Neck supple.  Cardiovascular: Normal rate, regular rhythm and intact distal pulses.   No murmur heard. Pulmonary/Chest: Effort normal and breath sounds normal. No respiratory distress. She has no wheezes. She has no rales. She exhibits no tenderness.  Musculoskeletal: Normal range of motion.  Lymphadenopathy:    She has no cervical adenopathy.  Neurological: She is alert and oriented to person, place, and time. Coordination normal.  Skin: Skin is warm and dry. No rash noted. She is not diaphoretic. No erythema. No pallor.  Psychiatric: She has a normal mood and affect.  Her behavior is normal. Judgment and thought content normal.  Nursing note and vitals reviewed.         Assessment & Plan:   1. Elevated liver function tests   2. Chronic sinusitis, unspecified location   3. Healthcare maintenance   4. Hyperlipidemia, unspecified hyperlipidemia type   5. Depression, unspecified depression type   6. Anxiety     Chronic sinusitis Continue with ENT. Continue excellent hydration.  Healthcare maintenance Continue all medications as directed. Increase water intake, strive for at least 80 ounces/day.   Follow Heart Healthy diet Increase regular exercise.  Recommend at least 30 minutes daily, 5 days per week of walking, jogging, biking, swimming, YouTube/Pinterest workout videos. Regular follow-up in 6 months, sooner if needed.  Elevated liver function tests We will call when liver function lab results are available-if still elevated will refer to GI. Please schedule nurse visit for fasting labs in Dec to re-check cholesterol levels.  Hyperlipidemia 06/14/2017:  Ref Range & Units 102mo ago  Cholesterol, Total 100 - 199 mg/dL 252    Triglycerides 0 - 149 mg/dL 119   HDL >39 mg/dL 56   VLDL Cholesterol Cal 5 - 40 mg/dL 24   LDL Calculated 0 - 99 mg/dL 172    Chol/HDL Ratio 0.0 - 4.4 ratio 4.5     Discussed at length TLC vs statin therapy. Will re-check lipids in Dec 2018  Depression Citalopram 40mg  refilled.   Anxiety Unable to verify [place] Controlled Substance Database- due to pending provider validation. Discussed how she is using Klonopin 0.5mg - 1/2 tab several days a week and denies over-use or impairment. RF provided.     FOLLOW-UP:  Return in about 6 months (around 01/12/2018) for Regular Follow Up.

## 2017-07-15 ENCOUNTER — Encounter: Payer: Self-pay | Admitting: Adult Health

## 2017-07-15 ENCOUNTER — Ambulatory Visit (INDEPENDENT_AMBULATORY_CARE_PROVIDER_SITE_OTHER): Payer: BLUE CROSS/BLUE SHIELD | Admitting: Adult Health

## 2017-07-15 VITALS — BP 96/63 | HR 81 | Ht 67.0 in | Wt 149.6 lb

## 2017-07-15 DIAGNOSIS — R7989 Other specified abnormal findings of blood chemistry: Secondary | ICD-10-CM | POA: Diagnosis not present

## 2017-07-15 DIAGNOSIS — F329 Major depressive disorder, single episode, unspecified: Secondary | ICD-10-CM | POA: Diagnosis not present

## 2017-07-15 DIAGNOSIS — Z0000 Encounter for general adult medical examination without abnormal findings: Secondary | ICD-10-CM | POA: Diagnosis not present

## 2017-07-15 DIAGNOSIS — E785 Hyperlipidemia, unspecified: Secondary | ICD-10-CM | POA: Insufficient documentation

## 2017-07-15 DIAGNOSIS — F419 Anxiety disorder, unspecified: Secondary | ICD-10-CM | POA: Diagnosis not present

## 2017-07-15 DIAGNOSIS — R945 Abnormal results of liver function studies: Principal | ICD-10-CM

## 2017-07-15 DIAGNOSIS — J329 Chronic sinusitis, unspecified: Secondary | ICD-10-CM

## 2017-07-15 DIAGNOSIS — F32A Depression, unspecified: Secondary | ICD-10-CM

## 2017-07-15 MED ORDER — CITALOPRAM HYDROBROMIDE 40 MG PO TABS: 40.0000 mg | ORAL_TABLET | Freq: Every day | ORAL | 1 refills | Status: DC

## 2017-07-15 MED ORDER — CLONAZEPAM 0.5 MG PO TABS: 0.5000 mg | ORAL_TABLET | Freq: Every evening | ORAL | 0 refills | Status: DC | PRN

## 2017-07-15 NOTE — Addendum Note (Signed)
Addended by: Fonnie Mu on: 07/15/2017 10:00 AM   Modules accepted: Orders

## 2017-07-15 NOTE — Assessment & Plan Note (Signed)
Unable to verify [place] Controlled Substance Database- due to pending provider validation. Discussed how she is using Klonopin 0.5mg - 1/2 tab several days a week and denies over-use or impairment. RF provided.

## 2017-07-15 NOTE — Assessment & Plan Note (Signed)
We will call when liver function lab results are available-if still elevated will refer to GI. Please schedule nurse visit for fasting labs in Dec to re-check cholesterol levels.

## 2017-07-15 NOTE — Patient Instructions (Signed)
Heart-Healthy Eating Plan Many factors influence your heart health, including eating and exercise habits. Heart (coronary) risk increases with abnormal blood fat (lipid) levels. Heart-healthy meal planning includes limiting unhealthy fats, increasing healthy fats, and making other small dietary changes. This includes maintaining a healthy body weight to help keep lipid levels within a normal range. What is my plan? Your health care provider recommends that you:  Get no more than __25__% of the total calories in your daily diet from fat.  Limit your intake of saturated fat to less than ___5___% of your total calories each day.  Limit the amount of cholesterol in your diet to less than ___300__ mg per day.  What types of fat should I choose?  Choose healthy fats more often. Choose monounsaturated and polyunsaturated fats, such as olive oil and canola oil, flaxseeds, walnuts, almonds, and seeds.  Eat more omega-3 fats. Good choices include salmon, mackerel, sardines, tuna, flaxseed oil, and ground flaxseeds. Aim to eat fish at least two times each week.  Limit saturated fats. Saturated fats are primarily found in animal products, such as meats, butter, and cream. Plant sources of saturated fats include palm oil, palm kernel oil, and coconut oil.  Avoid foods with partially hydrogenated oils in them. These contain trans fats. Examples of foods that contain trans fats are stick margarine, some tub margarines, cookies, crackers, and other baked goods. What general guidelines do I need to follow?  Check food labels carefully to identify foods with trans fats or high amounts of saturated fat.  Fill one half of your plate with vegetables and green salads. Eat 4-5 servings of vegetables per day. A serving of vegetables equals 1 cup of raw leafy vegetables,  cup of raw or cooked cut-up vegetables, or  cup of vegetable juice.  Fill one fourth of your plate with whole grains. Look for the word "whole"  as the first word in the ingredient list.  Fill one fourth of your plate with lean protein foods.  Eat 4-5 servings of fruit per day. A serving of fruit equals one medium whole fruit,  cup of dried fruit,  cup of fresh, frozen, or canned fruit, or  cup of 100% fruit juice.  Eat more foods that contain soluble fiber. Examples of foods that contain this type of fiber are apples, broccoli, carrots, beans, peas, and barley. Aim to get 20-30 g of fiber per day.  Eat more home-cooked food and less restaurant, buffet, and fast food.  Limit or avoid alcohol.  Limit foods that are high in starch and sugar.  Avoid fried foods.  Cook foods by using methods other than frying. Baking, boiling, grilling, and broiling are all great options. Other fat-reducing suggestions include: ? Removing the skin from poultry. ? Removing all visible fats from meats. ? Skimming the fat off of stews, soups, and gravies before serving them. ? Steaming vegetables in water or broth.  Lose weight if you are overweight. Losing just 5-10% of your initial body weight can help your overall health and prevent diseases such as diabetes and heart disease.  Increase your consumption of nuts, legumes, and seeds to 4-5 servings per week. One serving of dried beans or legumes equals  cup after being cooked, one serving of nuts equals 1 ounces, and one serving of seeds equals  ounce or 1 tablespoon.  You may need to monitor your salt (sodium) intake, especially if you have high blood pressure. Talk with your health care provider or dietitian to get  more information about reducing sodium. What foods can I eat? Grains  Breads, including [place], white, pita, wheat, raisin, rye, oatmeal, and [place]. Tortillas that are neither fried nor made with lard or trans fat. Low-fat rolls, including hotdog and hamburger buns and English muffins. Biscuits. Muffins. Waffles. Pancakes. Light popcorn. Whole-grain cereals. Flatbread. Melba  toast. Pretzels. Breadsticks. Rusks. Low-fat snacks and crackers, including oyster, saltine, matzo, graham, animal, and rye. Rice and pasta, including brown rice and those that are made with whole wheat. Vegetables All vegetables. Fruits All fruits, but limit coconut. Meats and Other Protein Sources Lean, well-trimmed beef, veal, pork, and lamb. Chicken and [place] without skin. All fish and shellfish. Wild duck, rabbit, pheasant, and venison. Egg whites or low-cholesterol egg substitutes. Dried beans, peas, lentils, and tofu.Seeds and most nuts. Dairy Low-fat or nonfat cheeses, including ricotta, string, and mozzarella. Skim or 1% milk that is liquid, powdered, or evaporated. Buttermilk that is made with low-fat milk. Nonfat or low-fat yogurt. Beverages Mineral water. Diet carbonated beverages. Sweets and Desserts Sherbets and fruit ices. Honey, jam, marmalade, jelly, and syrups. Meringues and gelatins. Pure sugar candy, such as hard candy, jelly beans, gumdrops, mints, marshmallows, and small amounts of dark chocolate. [employer] Eat all sweets and desserts in moderation. Fats and Oils Nonhydrogenated (trans-free) margarines. Vegetable oils, including soybean, sesame, sunflower, olive, peanut, safflower, corn, canola, and cottonseed. Salad dressings or mayonnaise that are made with a vegetable oil. Limit added fats and oils that you use for cooking, baking, salads, and as spreads. Other Cocoa powder. Coffee and tea. All seasonings and condiments. The items listed above may not be a complete list of recommended foods or beverages. Contact your dietitian for more options. What foods are not recommended? Grains Breads that are made with saturated or trans fats, oils, or whole milk. Croissants. Butter rolls. Cheese breads. Sweet rolls. Donuts. Buttered popcorn. Chow mein noodles. High-fat crackers, such as cheese or butter crackers. Meats and Other Protein Sources Fatty meats, such as  hotdogs, short ribs, sausage, spareribs, bacon, ribeye roast or steak, and mutton. High-fat deli meats, such as salami and bologna. Caviar. Domestic duck and goose. Organ meats, such as kidney, liver, sweetbreads, brains, gizzard, chitterlings, and heart. Dairy Cream, sour cream, cream cheese, and creamed cottage cheese. Whole milk cheeses, including blue (bleu), Monte[place] Jack, [place], [place], American, [place], [place], [place], [place], and [place]. Whole or 2% milk that is liquid, evaporated, or condensed. Whole buttermilk. Cream sauce or high-fat cheese sauce. Yogurt that is made from whole milk. Beverages Regular sodas and drinks with added sugar. Sweets and Desserts Frosting. Pudding. Cookies. Cakes other than angel food cake. Candy that has milk chocolate or white chocolate, hydrogenated fat, butter, coconut, or unknown ingredients. Buttered syrups. Full-fat ice cream or ice cream drinks. Fats and Oils Gravy that has suet, meat fat, or shortening. Cocoa butter, hydrogenated oils, palm oil, coconut oil, palm kernel oil. These can often be found in baked products, candy, fried foods, nondairy creamers, and whipped toppings. Solid fats and shortenings, including bacon fat, salt pork, lard, and butter. Nondairy cream substitutes, such as coffee creamers and sour cream substitutes. Salad dressings that are made of unknown oils, cheese, or sour cream. The items listed above may not be a complete list of foods and beverages to avoid. Contact your dietitian for more information. This information is not intended to replace advice given to you by your health care provider. Make sure you discuss any questions you have with your health care  provider. Document Released: 07/21/2008 Document Revised: 05/01/2016 Document Reviewed: 04/05/2014 Elsevier Interactive Patient Education  2017 [place] all medications as directed. We will call when liver function lab results are available-if still  elevated will refer to GI. Please schedule nurse visit for fasting labs in Dec to re-check cholesterol levels. Increase water intake, strive for at least 80 ounces/day.   Follow Heart Healthy diet Increase regular exercise.  Recommend at least 30 minutes daily, 5 days per week of walking, jogging, biking, swimming, YouTube/Pinterest workout videos. Regular follow-up in 6 months, sooner if needed.

## 2017-07-15 NOTE — Assessment & Plan Note (Signed)
Citalopram 40mg  refilled.

## 2017-07-15 NOTE — Assessment & Plan Note (Signed)
06/14/2017:  Ref Range & Units 76mo ago  Cholesterol, Total 100 - 199 mg/dL 252    Triglycerides 0 - 149 mg/dL 119   HDL >39 mg/dL 56   VLDL Cholesterol Cal 5 - 40 mg/dL 24   LDL Calculated 0 - 99 mg/dL 172    Chol/HDL Ratio 0.0 - 4.4 ratio 4.5     Discussed at length TLC vs statin therapy. Will re-check lipids in Dec 2018

## 2017-07-15 NOTE — Assessment & Plan Note (Signed)
Continue all medications as directed. Increase water intake, strive for at least 80 ounces/day.   Follow Heart Healthy diet Increase regular exercise.  Recommend at least 30 minutes daily, 5 days per week of walking, jogging, biking, swimming, YouTube/Pinterest workout videos. Regular follow-up in 6 months, sooner if needed.

## 2017-07-15 NOTE — Assessment & Plan Note (Signed)
Continue with ENT. Continue excellent hydration.

## 2017-07-16 LAB — HEPATIC FUNCTION PANEL
ALT: 38 IU/L — AB (ref 0–32)
AST: 36 IU/L (ref 0–40)
Albumin: 4.6 g/dL (ref 3.5–5.5)
Alkaline Phosphatase: 74 IU/L (ref 39–117)
Bilirubin Total: 0.3 mg/dL (ref 0.0–1.2)
Bilirubin, Direct: 0.07 mg/dL (ref 0.00–0.40)
Total Protein: 7.3 g/dL (ref 6.0–8.5)

## 2017-07-21 ENCOUNTER — Other Ambulatory Visit: Payer: BLUE CROSS/BLUE SHIELD

## 2017-07-22 NOTE — Progress Notes (Deleted)
   Subjective:    Patient ID: Felicia Good, female    DOB: 02-26-1961, 56 y.o.   MRN: [id number]  HPI:  Felicia Good presents with     Review of Systems     Objective:   Physical Exam        Assessment & Plan:

## 2017-07-23 ENCOUNTER — Encounter: Payer: Self-pay | Admitting: Adult Health

## 2017-07-23 ENCOUNTER — Ambulatory Visit (INDEPENDENT_AMBULATORY_CARE_PROVIDER_SITE_OTHER): Payer: BLUE CROSS/BLUE SHIELD | Admitting: Adult Health

## 2017-07-23 ENCOUNTER — Ambulatory Visit: Payer: BLUE CROSS/BLUE SHIELD | Admitting: Adult Health

## 2017-07-23 DIAGNOSIS — B370 Candidal stomatitis: Secondary | ICD-10-CM | POA: Insufficient documentation

## 2017-07-23 MED ORDER — CLOTRIMAZOLE 10 MG MT TROC: 10.0000 mg | Freq: Every day | OROMUCOSAL | 0 refills | Status: DC

## 2017-07-23 NOTE — Progress Notes (Signed)
Subjective:    Patient ID: Felicia Good, female    DOB: 28-Dec-1960, 56 y.o.   MRN: [id number]  HPI:  Felicia Good was seen by ENT 2 weeks ago and placed on Clindamycin TID and completed course.  5 days ago she felt "like there was a hair on the back of my tongue" and Tuesday she "felt like a coating was over my tongue".  She has increased fluids and rinsed with Hydrogen Peroxide without any sx relief.  She denies ever having thrush before.  She smokes pack every 2-3 days and again declined smoking cessation. She denies pain and altered sense of taste.  She reports mild halitosis.  Patient Care Team    Relationship Specialty Notifications Start End  Felicia Marble D, NP PCP - General Family Medicine  06/14/17   Felicia Kava, MD Referring Physician Obstetrics and Gynecology  06/14/17     Patient Active Problem List   Diagnosis Date Noted  . Elevated liver function tests 07/15/2017  . Hyperlipidemia 07/15/2017  . Anxiety 07/15/2017  . Chronic sinusitis 06/14/2017  . Healthcare maintenance 06/14/2017  . PANIC DISORDER,NO AGORAPHOBIA 10/13/2010  . TOBACCO ABUSE 08/13/2008  . BACK PAIN 01/19/2008  . URTICARIA NOS 08/15/2007  . Depression 06/15/2007     Past Medical History:  Diagnosis Date  . Anxiety   . Depression   . IBS (irritable bowel syndrome)   . Migraine headache   . Mitral valve prolapse   . Panic attacks   . Rectal polyp   . RLS (restless legs syndrome)      Past Surgical History:  Procedure Laterality Date  . rectal polyp removed  1987   Local anesthesia used  . RECTAL POLYPECTOMY       Family History  Problem Relation Age of Onset  . Other Father        esophageal problems  . Heart failure Father   . Cirrhosis Father        Non alcoholic   . Stroke Father        Died in 51   . Hypertension Father   . Hypertension Mother   . Stroke Mother   . Dementia Mother   . Breast cancer Maternal Aunt   . Colon cancer Neg Hx      History  Drug Use No      History  Alcohol Use  . 0.0 oz/week    Comment: occ     History  Smoking Status  . Current Every Day Smoker  . Packs/day: 0.25  . Years: 40.00  . Types: Cigarettes  Smokeless Tobacco  . Never Used     Outpatient Encounter Prescriptions as of 07/23/2017  Medication Sig  . Cholecalciferol (VITAMIN D3) 2000 units TABS Take 1 tablet by mouth daily.  . citalopram (CELEXA) 40 MG tablet Take 1 tablet (40 mg total) by mouth daily.  . clonazePAM (KLONOPIN) 0.5 MG tablet Take 1 tablet (0.5 mg total) by mouth at bedtime as needed for anxiety.  . cyclobenzaprine (FLEXERIL) 10 MG tablet Take 10 mg by mouth 3 (three) times daily as needed for muscle spasms.  . mometasone (NASONEX) 50 MCG/ACT nasal spray Place 1 spray into the nose 2 (two) times daily.  . Prenatal Vit-Fe Fumarate-FA (PRENATAL VITAMIN PO) Take 1 tablet by mouth daily.  . TRIAMCINOLONE ACETONIDE EX 1 application Topical 2 times daily  . clotrimazole (MYCELEX) 10 MG troche Take 1 tablet (10 mg total) by mouth 5 (five) times daily.  No facility-administered encounter medications on file as of 07/23/2017.     Allergies: Patient has no known allergies.  Body mass index is 23.1 kg/m.  Blood pressure 107/68, pulse 82, temperature 98.7 F (37.1 C), temperature source Oral, height 5\' 7"  (1.702 m), weight 147 lb 8 oz (66.9 kg), last menstrual period 11/26/2013.     Review of Systems  Constitutional: Positive for fatigue. Negative for activity change, appetite change, chills, diaphoresis, fever and unexpected weight change.  HENT: Positive for congestion, postnasal drip, sinus pain and sinus pressure. Negative for sore throat, tinnitus, trouble swallowing and voice change.   Eyes: Negative for visual disturbance.  Respiratory: Negative for cough, chest tightness, shortness of breath, wheezing and stridor.   Cardiovascular: Negative for chest pain, palpitations and leg swelling.  Hematological: Does not bruise/bleed  easily.       Objective:   Physical Exam  Constitutional: She appears well-developed and well-nourished. She appears distressed.  HENT:  Head: Normocephalic and atraumatic.  Right Ear: External ear normal.  Left Ear: External ear normal.  Mouth/Throat: Uvula is midline. Mucous membranes are dry. Normal dentition. Dental caries present. No uvula swelling. No oropharyngeal exudate or tonsillar abscesses.  Thick white/yellow coating noted from root to mid body of tongue.  Skin: Skin is warm and dry. No rash noted. She is not diaphoretic. No erythema. No pallor.  Psychiatric: She has a normal mood and affect. Her behavior is normal. Judgment and thought content normal.  Nursing note and vitals reviewed.         Assessment & Plan:   1. Thrush, oral     Thrush, oral Handout provided. Clotrimazole 10mg  five times daily. Practice excellent oral hygiene. Reduce-stop tobacco use. If sx's persist after 5 days, call clinic.     FOLLOW-UP:  Return if symptoms worsen or fail to improve.

## 2017-07-23 NOTE — Assessment & Plan Note (Signed)
Handout provided. Clotrimazole 10mg  five times daily. Practice excellent oral hygiene. Reduce-stop tobacco use. If sx's persist after 5 days, call clinic.

## 2017-07-23 NOTE — Patient Instructions (Addendum)
Oral Thrush, Adult Oral thrush, also called oral candidiasis, is a fungal infection that develops in the mouth and throat and on the tongue. It causes white patches to form on the mouth and tongue. Thrush is most common in older adults, but it can occur at any age. Many cases of thrush are mild, but this infection can also be serious. Thrush can be a repeated (recurrent) problem for certain people who have a weak body defense system (immune system). The weakness can be caused by chronic illnesses, or by taking medicines that limit the body's ability to fight infection. If a person has difficulty fighting infection, the fungus that causes thrush can spread through the body. This can cause life-threatening blood or organ infections. What are the causes? This condition is caused by a fungus (yeast) called Candida albicans.  This fungus is normally present in small amounts in the mouth and on other mucous membranes. It usually causes no harm.  If conditions are present that allow the fungus to grow without control, it invades surrounding tissues and becomes an infection.  Other Candida species can also lead to thrush (rare).  What increases the risk? This condition is more likely to develop in:  People with a weakened immune system.  Older adults.  People with HIV (human immunodeficiency virus).  People with diabetes.  People with dry mouth (xerostomia).  Pregnant women.  People with poor dental care, especially people who have false teeth.  People who use antibiotic medicines.  What are the signs or symptoms? Symptoms of this condition can vary from mild and moderate to severe and persistent. Symptoms may include:  A burning feeling in the mouth and throat. This can occur at the start of a thrush infection.  White patches that stick to the mouth and tongue. The tissue around the patches may be red, raw, and painful. If rubbed (during tooth brushing, for example), the patches and the  tissue of the mouth may bleed easily.  A bad taste in the mouth or difficulty tasting foods.  A cottony feeling in the mouth.  Pain during eating and swallowing.  Poor appetite.  Cracking at the corners of the mouth.  How is this diagnosed? This condition is diagnosed based on:  Physical exam. Your health care provider will look in your mouth.  Health history. Your health care provider will ask you questions about your health.  How is this treated? This condition is treated with medicines called antifungals, which prevent the growth of fungi. These medicines are either applied directly to the affected area (topical) or swallowed (oral). The treatment will depend on the severity of the condition. Mild thrush Mild cases of thrush may clear up with the use of an antifungal mouth rinse or lozenges. Treatment usually lasts about 14 days. Moderate to severe thrush  More severe thrush infections that have spread to the esophagus are treated with an oral antifungal medicine. A topical antifungal medicine may also be used.  For some severe infections, treatment may need to continue for more than 14 days.  Oral antifungal medicines are rarely used during pregnancy because they may be harmful to the unborn child. If you are pregnant, talk with your health care provider about options for treatment. Persistent or recurrent thrush For cases of thrush that do not go away or keep coming back:  Treatment may be needed twice as long as the symptoms last.  Treatment will include both oral and topical antifungal medicines.  People with a weakened immune   system can take an antifungal medicine on a continuous basis to prevent thrush infections.  It is important to treat conditions that make a person more likely to get thrush, such as diabetes or HIV. Follow these instructions at home: Medicines  Take over-the-counter and prescription medicines only as told by your health care provider.  Talk  with your health care provider about an over-the-counter medicine called gentian violet, which kills bacteria and fungi. Relieving soreness and discomfort To help reduce the discomfort of thrush:  Drink cold liquids such as water or iced tea.  Try flavored ice treats or frozen juices.  Eat foods that are easy to swallow, such as gelatin, ice cream, or custard.  Try drinking from a straw if the patches in your mouth are painful.  General instructions  Eat plain, unflavored yogurt as directed by your health care provider. Check the label to make sure the yogurt contains live cultures. This yogurt can help healthy bacteria to grow in the mouth and can stop the growth of the fungus that causes thrush.  If you wear dentures, remove the dentures before going to bed, brush them vigorously, and soak them in a cleaning solution as directed by your health care provider.  Rinse your mouth with a warm salt-water mixture several times a day. To make a salt-water mixture, completely dissolve 1/2-1 tsp of salt in 1 cup of warm water. Contact a health care provider if:  Your symptoms are getting worse or are not improving within 7 days of starting treatment.  You have symptoms of a spreading infection, such as white patches on the skin outside of the mouth. This information is not intended to replace advice given to you by your health care provider. Make sure you discuss any questions you have with your health care provider. Document Released: 07/07/2004 Document Revised: 07/06/2016 Document Reviewed: 07/06/2016 Elsevier Interactive Patient Education  2017 Elsevier Inc.  Clotrimazole lozenges What is this medicine? CLOTRIMAZOLE (kloe TRIM a zole) is an antifungal medicine. It is used to treat certain kinds of fungal or yeast infections of the mouth. This medicine may be used for other purposes; ask your health care provider or pharmacist if you have questions. COMMON BRAND NAME(S): Mycelex  Troche What should I tell my health care provider before I take this medicine? They need to know if you have any of these conditions: -liver disease -an unusual or allergic reaction to clotrimazole, other medicines, foods, dyes or preservatives -pregnant or trying to get pregnant -breast-feeding How should I use this medicine? Allow this medicine to dissolve slowly in your mouth. Do not chew or swallow whole. Follow the directions on the prescription label. Take your medicine at regular intervals. Do not take your medicine more often than directed. Do not skip doses or stop your medicine early even if you feel better. Do not stop taking except on your doctor's advice. Talk to your pediatrician regarding the use of this medicine in children. While this drug may be prescribed for children as young as 21 years old for selected conditions, precautions do apply. Overdosage: If you think you have taken too much of this medicine contact a poison control center or emergency room at once. NOTE: This medicine is only for you. Do not share this medicine with others. What if I miss a dose? If you miss a dose, take it as soon as you can. If it is almost time for your next dose, take only that dose. Do not take double or extra  doses. What may interact with this medicine? Interactions are not expected. This list may not describe all possible interactions. Give your health care provider a list of all the medicines, herbs, non-prescription drugs, or dietary supplements you use. Also tell them if you smoke, drink alcohol, or use illegal drugs. Some items may interact with your medicine. What should I watch for while using this medicine? Tell your doctor or health care professional if your symptoms do not improve or get worse. What side effects may I notice from receiving this medicine? Side effects that you should report to your doctor or health care professional as soon as possible: -allergic reactions like skin  rash, itching or hives, swelling of the face, lips, or tongue -irritation in the mouth Side effects that usually do not require medical attention (report to your doctor or health care professional if they continue or are bothersome): -diarrhea -nausea, vomiting -stomach upset This list may not describe all possible side effects. Call your doctor for medical advice about side effects. You may report side effects to FDA at 1-800-FDA-1088. Where should I keep my medicine? Keep out of the reach of children. Store at room temperature below 30 degrees C (86 degrees F). Do not freeze. Throw away any unused medicine after the expiration date. NOTE: This sheet is a summary. It may not cover all possible information. If you have questions about this medicine, talk to your doctor, pharmacist, or health care provider.  2018 Elsevier/Gold Standard (2008-01-16 16:51:43)  If symptoms persist after 5 days, please call clinic. Practice good oral hygiene. FEEL BETTER!

## 2017-07-26 ENCOUNTER — Ambulatory Visit (INDEPENDENT_AMBULATORY_CARE_PROVIDER_SITE_OTHER): Payer: BLUE CROSS/BLUE SHIELD

## 2017-07-26 VITALS — BP 89/56 | HR 80 | Temp 98.5°F

## 2017-07-26 DIAGNOSIS — Z23 Encounter for immunization: Secondary | ICD-10-CM | POA: Diagnosis not present

## 2017-07-27 DIAGNOSIS — R51 Headache: Secondary | ICD-10-CM | POA: Diagnosis not present

## 2017-07-27 DIAGNOSIS — J329 Chronic sinusitis, unspecified: Secondary | ICD-10-CM | POA: Diagnosis not present

## 2017-07-29 ENCOUNTER — Telehealth: Payer: Self-pay | Admitting: Adult Health

## 2017-07-29 NOTE — Telephone Encounter (Signed)
Patient called states she has completed the Rx for thrush but it is still on her tongue--Advised Valetta Fuller is out of office & will send message to medical assistant to see what Dr. Raliegh Scarlet advises (told her someone will contact her). --glh

## 2017-07-29 NOTE — Telephone Encounter (Signed)
Pt informed that, per Katy's note, she would need OV to re-evaluate.  Pt expressed understanding and is agreeable.  Pt transferred to front desk to schedule OV.  Charyl Bigger, CMA

## 2017-07-30 ENCOUNTER — Encounter: Payer: Self-pay | Admitting: Family Medicine

## 2017-07-30 ENCOUNTER — Ambulatory Visit (INDEPENDENT_AMBULATORY_CARE_PROVIDER_SITE_OTHER): Payer: BLUE CROSS/BLUE SHIELD | Admitting: Family Medicine

## 2017-07-30 VITALS — BP 96/63 | HR 80 | Ht 67.0 in | Wt 148.9 lb

## 2017-07-30 DIAGNOSIS — K143 Hypertrophy of tongue papillae: Secondary | ICD-10-CM | POA: Diagnosis not present

## 2017-07-30 MED ORDER — NYSTATIN 100000 UNIT/ML MT SUSP: 5.0000 mL | Freq: Four times a day (QID) | OROMUCOSAL | 2 refills | Status: DC

## 2017-07-30 NOTE — Progress Notes (Signed)
Pt here for an acute care OV today   Impression and Recommendations:    1. Brown hairy tongue    - Mycostatin suspension given to patient after risk benefits of meds discussed and usual course of treatment. -Follow-up sooner than planned if not resolving as we would anticipate over the next several weeks. -Preventative strategies discussed with patient.  The patient was counseled, risk factors were discussed, anticipatory guidance given.  Gross side effects, risk and benefits, and alternatives of medications and treatment plan in general discussed with patient.  Patient is aware that all medications have potential side effects and we are unable to predict every side effect or drug-drug interaction that may occur.   Patient will call with any questions prior to using medication if they have concerns.  Expresses verbal understanding and consents to current therapy and treatment regimen.  No barriers to understanding were identified.  Red flag symptoms and signs discussed in detail.  Patient expressed understanding regarding what to do in case of emergency\urgent symptoms  Please see AVS handed out to patient at the end of our visit for further patient instructions/ counseling done pertaining to today's office visit.   Return if symptoms worsen or fail to improve, for also, f/up for routine care of chronic conditions as previously discussed.     Note: This document was prepared using Dragon voice recognition software and may include unintentional dictation errors.  Mellody Dance 12:26 PM --------------------------------------------------------------------------------------------------------------------------------------------------------------------------------------------------------------------------------------------    Subjective:    CC:  Chief Complaint  Patient presents with  . Follow-up    thrush    HPI: Felicia Good is a 57 y.o. female who presents to [place] [facility] today for issues as discussed below.  Patient presents to clinic because she has a discoloration of her tongue.  She feels like her tastes sensation has been changed and wondering what is going on.  She has tried to brush her teeth and tongue but the discoloration is not improving.  Patient tells me she is been trying to use mouthwash to no avail.  Wt Readings from Last 3 Encounters:  08/25/17 150 lb (68 kg)  07/30/17 148 lb 14.4 oz (67.5 kg)  07/23/17 147 lb 8 oz (66.9 kg)   BP Readings from Last 3 Encounters:  08/25/17 101/69  07/30/17 96/63  07/26/17 (!) 89/56   BMI Readings from Last 3 Encounters:  08/25/17 23.49 kg/m  07/30/17 23.32 kg/m  07/23/17 23.10 kg/m     Patient Care Team    Relationship Specialty Notifications Start End  Esaw Grandchild, NP PCP - General Family Medicine  06/14/17   Sanjuana Kava, MD Referring Physician Obstetrics and Gynecology  06/14/17      Patient Active Problem List   Diagnosis Date Noted  . Oral pharyngeal candidiasis 08/25/2017  . 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 Thrush, oral 07/23/2017  . Elevated liver function tests 07/15/2017  . Hyperlipidemia 07/15/2017  . Anxiety 07/15/2017  . Chronic sinusitis 06/14/2017  . Healthcare maintenance 06/14/2017  . PANIC DISORDER,NO AGORAPHOBIA 10/13/2010  . TOBACCO ABUSE 08/13/2008  . BACK PAIN 01/19/2008  . URTICARIA NOS 08/15/2007  . Depression 06/15/2007    Past Medical history, Surgical history, Family history, Social history, Allergies and Medications have been entered into the medical record, reviewed and changed as needed.    Current Meds  Medication Sig  . Cholecalciferol (VITAMIN D3) 2000 units TABS Take 1 tablet by mouth daily.  . citalopram (CELEXA)  40 MG tablet Take 1 tablet (40 mg total) by mouth daily.  . clonazePAM (KLONOPIN) 0.5 MG tablet Take 1 tablet (0.5 mg total) by mouth at bedtime as needed for anxiety.  . cyclobenzaprine (FLEXERIL) 10 MG  tablet Take 10 mg by mouth 3 (three) times daily as needed for muscle spasms.  . mometasone (NASONEX) 50 MCG/ACT nasal spray Place 1 spray into the nose 2 (two) times daily.  . Prenatal Vit-Fe Fumarate-FA (PRENATAL VITAMIN PO) Take 1 tablet by mouth daily.  . TRIAMCINOLONE ACETONIDE EX 1 application Topical 2 times daily  . [DISCONTINUED] clotrimazole (MYCELEX) 10 MG troche Take 1 tablet (10 mg total) by mouth 5 (five) times daily.    Allergies:  No Known Allergies   Review of Systems: General:   Denies fever, chills, unexplained weight loss.  Optho/Auditory:   Denies visual changes, blurred vision/LOV Respiratory:   Denies wheeze, DOE more than baseline levels.  Cardiovascular:   Denies chest pain, palpitations, new onset peripheral edema  Gastrointestinal:   Denies nausea, vomiting, diarrhea, abd pain.  Genitourinary: Denies dysuria, freq/ urgency, flank pain or discharge from genitals.  Endocrine:     Denies hot or cold intolerance, polyuria, polydipsia. Musculoskeletal:   Denies unexplained myalgias, joint swelling, unexplained arthralgias, gait problems.  Skin:  Denies new onset rash, suspicious lesions Neurological:     Denies dizziness, unexplained weakness, numbness  Psychiatric/Behavioral:   Denies mood changes, suicidal or homicidal ideations, hallucinations    Objective:   Blood pressure 96/63, pulse 80, height 5\' 7"  (1.702 m), weight 148 lb 14.4 oz (67.5 kg), last menstrual period 11/26/2013. Body mass index is 23.32 kg/m. General:  Well Developed, well nourished, appropriate for stated age.  Neuro:  Alert and oriented,  extra-ocular muscles intact  OP: Yellowish to brown discoloration of the elongated papilla on the dorsum of tongue.  Otherwise exam within normal limits. HEENT:  Normocephalic, atraumatic, neck supple Skin:  no gross rash, warm, pink. Vascular:  Ext warm, no cyanosis apprec.; cap RF less 2 sec. Psych:  No HI/SI, judgement and insight good, Euthymic  mood. Full Affect.

## 2017-08-24 ENCOUNTER — Telehealth: Payer: Self-pay | Admitting: Adult Health

## 2017-08-24 NOTE — Telephone Encounter (Signed)
Spoke with patient and advised that, per Dr. Hershal Coria last [place] note for this problem, she would need an OV to re-evaluate.  Pt expressed understanding and is agreeable.  Pt transferred to front desk to schedule appt.  Charyl Bigger, CMA

## 2017-08-24 NOTE — Telephone Encounter (Signed)
Patient requests a refill of her nystatin sent to [facility]

## 2017-08-25 ENCOUNTER — Ambulatory Visit (INDEPENDENT_AMBULATORY_CARE_PROVIDER_SITE_OTHER): Payer: BLUE CROSS/BLUE SHIELD | Admitting: Family Medicine

## 2017-08-25 ENCOUNTER — Encounter: Payer: Self-pay | Admitting: Family Medicine

## 2017-08-25 VITALS — BP 101/69 | HR 72 | Ht 67.0 in | Wt 150.0 lb

## 2017-08-25 DIAGNOSIS — K143 Hypertrophy of tongue papillae: Secondary | ICD-10-CM

## 2017-08-25 DIAGNOSIS — B370 Candidal stomatitis: Secondary | ICD-10-CM | POA: Insufficient documentation

## 2017-08-25 MED ORDER — NYSTATIN 100000 UNIT/ML MT SUSP: 5.0000 mL | Freq: Four times a day (QID) | OROMUCOSAL | 2 refills | Status: DC

## 2017-08-25 MED ORDER — FLUCONAZOLE 150 MG PO TABS: ORAL_TABLET | ORAL | 0 refills | Status: DC

## 2017-08-25 NOTE — Progress Notes (Signed)
Pt here for an acute care OV today   Impression and Recommendations:    1. Brown hairy tongue   2. Oral pharyngeal candidiasis    The patient was counseled, risk factors were discussed, anticipatory guidance given. - Meds written as per below. - Handouts provided on condition and importance of preventative strategies stressed to patient. -Told patient not to use anything else in her mouth besides what I prescribe and toothpaste/ floss  Meds ordered this encounter  Medications  . nystatin (MYCOSTATIN) 100000 UNIT/ML suspension    Sig: Take 5 mLs (500,000 Units total) by mouth 4 (four) times daily. Swish for 30 seconds and spit out.    Dispense:  180 mL    Refill:  2  . fluconazole (DIFLUCAN) 150 MG tablet    Sig: Once daily for 7 days then, once weekly x4    Dispense:  12 tablet    Refill:  0    Gross side effects, risk and benefits, and alternatives of medications and treatment plan in general discussed with patient.  Patient is aware that all medications have potential side effects and we are unable to predict every side effect or drug-drug interaction that may occur.   Patient will call with any questions prior to using medication if they have concerns.  Expresses verbal understanding and consents to current therapy and treatment regimen.  No barriers to understanding were identified.  Red flag symptoms and signs discussed in detail.  Patient expressed understanding regarding what to do in case of emergency\urgent symptoms  Please see AVS handed out to patient at the end of our visit for further patient instructions/ counseling done pertaining to today's office visit.   Return if symptoms worsen or fail to improve.     Note: This document was prepared using Dragon voice recognition software and may include unintentional dictation errors.  Lang Zingg 12:30  PM --------------------------------------------------------------------------------------------------------------------------------------------------------------------------------------------------------------------------------------------    Subjective:    CC:  Chief Complaint  Patient presents with  . Follow-up    HPI: Felicia Good is a 56 y.o. female who presents to [place] [facility] today for issues as discussed below.  Much improved but still brown and present. Took all meds- stopped yesterday and since sx still there- worried it will get worse.  Pt was very consistent taking meds.    Wt Readings from Last 3 Encounters:  08/25/17 150 lb (68 kg)  07/30/17 148 lb 14.4 oz (67.5 kg)  07/23/17 147 lb 8 oz (66.9 kg)   BP Readings from Last 3 Encounters:  08/25/17 101/69  07/30/17 96/63  07/26/17 (!) 89/56   BMI Readings from Last 3 Encounters:  08/25/17 23.49 kg/m  07/30/17 23.32 kg/m  07/23/17 23.10 kg/m     Patient Care Team    Relationship Specialty Notifications Start End  Esaw Grandchild, NP PCP - General Family Medicine  06/14/17   Sanjuana Kava, MD Referring Physician Obstetrics and Gynecology  06/14/17      Patient Active Problem List   Diagnosis Date Noted  . Oral pharyngeal candidiasis 08/25/2017  . 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 Thrush, oral 07/23/2017  . Elevated liver function tests 07/15/2017  . Hyperlipidemia 07/15/2017  . Anxiety 07/15/2017  . Chronic sinusitis 06/14/2017  . Healthcare maintenance 06/14/2017  . PANIC DISORDER,NO AGORAPHOBIA 10/13/2010  . TOBACCO ABUSE 08/13/2008  . BACK PAIN 01/19/2008  . URTICARIA NOS 08/15/2007  . Depression 06/15/2007    Past Medical history, Surgical history,  Family history, Social history, Allergies and Medications have been entered into the medical record, reviewed and changed as needed.    Current Meds  Medication Sig  . Cholecalciferol (VITAMIN D3) 2000 units TABS Take 1  tablet by mouth daily.  . citalopram (CELEXA) 40 MG tablet Take 1 tablet (40 mg total) by mouth daily.  . clonazePAM (KLONOPIN) 0.5 MG tablet Take 1 tablet (0.5 mg total) by mouth at bedtime as needed for anxiety.  . cyclobenzaprine (FLEXERIL) 10 MG tablet Take 10 mg by mouth 3 (three) times daily as needed for muscle spasms.  . mometasone (NASONEX) 50 MCG/ACT nasal spray Place 1 spray into the nose 2 (two) times daily.  Marland Kitchen nystatin (MYCOSTATIN) 100000 UNIT/ML suspension Take 5 mLs (500,000 Units total) by mouth 4 (four) times daily. Swish for 30 seconds and spit out.  . Prenatal Vit-Fe Fumarate-FA (PRENATAL VITAMIN PO) Take 1 tablet by mouth daily.  . TRIAMCINOLONE ACETONIDE EX 1 application Topical 2 times daily  . [DISCONTINUED] clotrimazole (MYCELEX) 10 MG troche Take 1 tablet (10 mg total) by mouth 5 (five) times daily.  . [DISCONTINUED] nystatin (MYCOSTATIN) 100000 UNIT/ML suspension Take 5 mLs (500,000 Units total) by mouth 4 (four) times daily. Swish for 30 seconds and spit out.    Allergies:  No Known Allergies   Review of Systems: General:   Denies fever, chills, unexplained weight loss.  Optho/Auditory:   Denies visual changes, blurred vision/LOV Respiratory:   Denies wheeze, DOE more than baseline levels.  Cardiovascular:   Denies chest pain, palpitations, new onset peripheral edema  Gastrointestinal:   Denies nausea, vomiting, diarrhea, abd pain.  Genitourinary: Denies dysuria, freq/ urgency, flank pain or discharge from genitals.  Endocrine:     Denies hot or cold intolerance, polyuria, polydipsia. Musculoskeletal:   Denies unexplained myalgias, joint swelling, unexplained arthralgias, gait problems.  Skin:  Denies new onset rash, suspicious lesions Neurological:     Denies dizziness, unexplained weakness, numbness  Psychiatric/Behavioral:   Denies mood changes, suicidal or homicidal ideations, hallucinations    Objective:   Blood pressure 101/69, pulse 72, height 5\' 7"   (1.702 m), weight 150 lb (68 kg), last menstrual period 11/26/2013. Body mass index is 23.49 kg/m. General:  Well Developed, well nourished, appropriate for stated age.  Neuro:  Alert and oriented,  extra-ocular muscles intact  HEENT:  Normocephalic, atraumatic, neck supple; OP: Improvement in brownish discoloration to papilla of the tongue on dorsal surface. Skin:  no gross rash, warm, pink. Vascular:  Ext warm, no cyanosis apprec.; cap RF less 2 sec. Psych:  No HI/SI, judgement and insight good, Euthymic mood. Full Affect.

## 2017-10-21 ENCOUNTER — Other Ambulatory Visit: Payer: BLUE CROSS/BLUE SHIELD

## 2017-10-21 DIAGNOSIS — E785 Hyperlipidemia, unspecified: Secondary | ICD-10-CM | POA: Diagnosis not present

## 2017-10-21 DIAGNOSIS — R945 Abnormal results of liver function studies: Secondary | ICD-10-CM | POA: Diagnosis not present

## 2017-10-21 DIAGNOSIS — R7989 Other specified abnormal findings of blood chemistry: Secondary | ICD-10-CM

## 2017-10-22 ENCOUNTER — Other Ambulatory Visit: Payer: Self-pay | Admitting: Adult Health

## 2017-10-22 DIAGNOSIS — R945 Abnormal results of liver function studies: Principal | ICD-10-CM

## 2017-10-22 DIAGNOSIS — R7989 Other specified abnormal findings of blood chemistry: Secondary | ICD-10-CM

## 2017-10-22 LAB — HEPATIC FUNCTION PANEL
ALBUMIN: 4.3 g/dL (ref 3.5–5.5)
ALT: 52 IU/L — AB (ref 0–32)
AST: 34 IU/L (ref 0–40)
Alkaline Phosphatase: 80 IU/L (ref 39–117)
BILIRUBIN TOTAL: 0.2 mg/dL (ref 0.0–1.2)
BILIRUBIN, DIRECT: 0.09 mg/dL (ref 0.00–0.40)
Total Protein: 6.9 g/dL (ref 6.0–8.5)

## 2017-10-22 LAB — LIPID PANEL
CHOL/HDL RATIO: 5.1 ratio — AB (ref 0.0–4.4)
Cholesterol, Total: 266 mg/dL — ABNORMAL HIGH (ref 100–199)
HDL: 52 mg/dL (ref >39)
LDL Calculated: 180 mg/dL — ABNORMAL HIGH (ref 0–99)
Triglycerides: 168 mg/dL — ABNORMAL HIGH (ref 0–149)
VLDL Cholesterol Cal: 34 mg/dL (ref 5–40)

## 2017-10-23 ENCOUNTER — Encounter: Payer: Self-pay | Admitting: Family Medicine

## 2017-11-08 ENCOUNTER — Other Ambulatory Visit (INDEPENDENT_AMBULATORY_CARE_PROVIDER_SITE_OTHER): Payer: BLUE CROSS/BLUE SHIELD

## 2017-11-08 ENCOUNTER — Encounter: Payer: Self-pay | Admitting: Physician Assistant

## 2017-11-08 ENCOUNTER — Ambulatory Visit: Payer: BLUE CROSS/BLUE SHIELD | Admitting: Physician Assistant

## 2017-11-08 VITALS — BP 100/60 | HR 78 | Ht 66.75 in | Wt 149.0 lb

## 2017-11-08 DIAGNOSIS — R7989 Other specified abnormal findings of blood chemistry: Secondary | ICD-10-CM

## 2017-11-08 DIAGNOSIS — R945 Abnormal results of liver function studies: Secondary | ICD-10-CM

## 2017-11-08 LAB — CBC WITH DIFFERENTIAL/PLATELET
BASOS PCT: 0.5 % (ref 0.0–3.0)
Basophils Absolute: 0 10*3/uL (ref 0.0–0.1)
EOS ABS: 0.1 10*3/uL (ref 0.0–0.7)
Eosinophils Relative: 2.3 % (ref 0.0–5.0)
HCT: 39.9 % (ref 36.0–46.0)
HEMOGLOBIN: 13.6 g/dL (ref 12.0–15.0)
LYMPHS PCT: 28.5 % (ref 12.0–46.0)
Lymphs Abs: 1.8 10*3/uL (ref 0.7–4.0)
MCHC: 34 g/dL (ref 30.0–36.0)
MCV: 94.7 fl (ref 78.0–100.0)
MONOS PCT: 5.7 % (ref 3.0–12.0)
Monocytes Absolute: 0.4 10*3/uL (ref 0.1–1.0)
NEUTROS ABS: 4 10*3/uL (ref 1.4–7.7)
Neutrophils Relative %: 63 % (ref 43.0–77.0)
PLATELETS: 224 10*3/uL (ref 150.0–400.0)
RBC: 4.21 Mil/uL (ref 3.87–5.11)
RDW: 13.8 % (ref 11.5–15.5)
WBC: 6.4 10*3/uL (ref 4.0–10.5)

## 2017-11-08 LAB — COMPREHENSIVE METABOLIC PANEL
ALBUMIN: 4.5 g/dL (ref 3.5–5.2)
ALT: 35 U/L (ref 0–35)
AST: 27 U/L (ref 0–37)
Alkaline Phosphatase: 70 U/L (ref 39–117)
BUN: 14 mg/dL (ref 6–23)
CALCIUM: 9.7 mg/dL (ref 8.4–10.5)
CHLORIDE: 104 meq/L (ref 96–112)
CO2: 30 meq/L (ref 19–32)
Creatinine, Ser: 0.67 mg/dL (ref 0.40–1.20)
GFR: 96.1 mL/min (ref >60.00)
Glucose, Bld: 97 mg/dL (ref 70–99)
POTASSIUM: 4.3 meq/L (ref 3.5–5.1)
Sodium: 140 mEq/L (ref 135–145)
Total Bilirubin: 0.5 mg/dL (ref 0.2–1.2)
Total Protein: 7.7 g/dL (ref 6.0–8.3)

## 2017-11-08 LAB — FERRITIN: FERRITIN: 135 ng/mL (ref 10.0–291.0)

## 2017-11-08 LAB — PROTIME-INR
INR: 1.1 ratio — AB (ref 0.8–1.0)
PROTHROMBIN TIME: 11.5 s (ref 9.6–13.1)

## 2017-11-08 LAB — SEDIMENTATION RATE: Sed Rate: 35 mm/hr — ABNORMAL HIGH (ref 0–30)

## 2017-11-08 LAB — HIGH SENSITIVITY CRP: CRP HIGH SENSITIVITY: 0.24 mg/L (ref 0.000–5.000)

## 2017-11-08 NOTE — Progress Notes (Signed)
Subjective:    Patient ID: Felicia Good, female    DOB: [birth year], 57 y.o.   MRN: [id number]  HPI Felicia Good is a pleasant 57 year old white female, referred today by Olene Floss NP for evaluation of elevated LFTs. Patient was previously known to Dr. Delfin Edis and had undergone colonoscopy in May 2016 which was a normal exam. She has history of panic disorder, depression and has otherwise been in good health. She says she has chronic problems with her back which she feels is related to muscle inflammation. Patient was noted to have mildly elevated transaminases in August 2018 with AST of 62 and ALT of 74, repeat in September 2018 with ALT of 38, repeat in December 2018 AST 34 ALT of 52, cholesterol 266 triglycerides 168. Reviewing her records liver tests were normal in 07-Feb-2016, she did have mildly elevated transaminases in Feb 07, 2012 with AST of 39 ALT of 40. Hepatitis B and C serologies were negative in Feb 07, 2015. 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 Felicia Good Patient's father is deceased in 02-06-14 secondary to complications of NASH cirrhosis and congestive heart failure. No other family history of liver disease that she is aware of. Patient is a smoker She has no history of regular EtOH use, no prior history of hepatitis. She says she has been on Celexa and/or Prozac for many years. She has no current GI symptoms.   Review of Systems Pertinent positive and negative review of systems were noted in the above HPI section.  All other review of systems was otherwise negative.  Outpatient Encounter Medications as of 11/08/2017  Medication Sig  . Cholecalciferol (VITAMIN D3) 2000 units TABS Take 1 tablet by mouth daily.  . citalopram (CELEXA) 40 MG tablet Take 1 tablet (40 mg total) by mouth daily.  . clonazePAM (KLONOPIN) 0.5 MG tablet Take 1 tablet (0.5 mg total) by mouth at bedtime as needed for anxiety.   . cyclobenzaprine (FLEXERIL) 10 MG tablet Take 10 mg by mouth 3 (three) times daily as needed for muscle spasms.  . fluticasone (FLONASE) 50 MCG/ACT nasal spray Place 1 spray into both nostrils 2 (two) times daily as needed for allergies or rhinitis.  Marland Kitchen GINSENG PO Take 2 capsules by mouth daily.  . Prenatal Vit-Fe Fumarate-FA (PRENATAL VITAMIN PO) Take 1 tablet by mouth daily.  . TRIAMCINOLONE ACETONIDE EX 1 application Topical 2 times daily  . [DISCONTINUED] fluconazole (DIFLUCAN) 150 MG tablet Once daily for 7 days then, once weekly x4  . [DISCONTINUED] mometasone (NASONEX) 50 MCG/ACT nasal spray Place 1 spray into the nose 2 (two) times daily.  . [DISCONTINUED] nystatin (MYCOSTATIN) 100000 UNIT/ML suspension Take 5 mLs (500,000 Units total) by mouth 4 (four) times daily. Swish for 30 seconds and spit out.   No facility-administered encounter medications on file as of 11/08/2017.    No Known Allergies Patient Active Problem List   Diagnosis Date Noted  . Oral pharyngeal candidiasis 08/25/2017  . 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 Thrush, oral 07/23/2017  . Elevated liver function tests 07/15/2017  . Hyperlipidemia 07/15/2017  . Anxiety 07/15/2017  . Chronic sinusitis 06/14/2017  . Healthcare maintenance 06/14/2017  . PANIC DISORDER,NO AGORAPHOBIA 10/13/2010  . TOBACCO ABUSE 08/13/2008  . BACK PAIN 01/19/2008  . URTICARIA NOS 08/15/2007  . Depression 06/15/2007   Social History   Socioeconomic History  . Marital status: Married    Spouse name:  Felicia Good  . Number of children: 1  . Years of education: Not on file  . Highest education level: Not on file  Social Needs  . Financial resource strain: Not on file  . Food insecurity - worry: Not on file  . Food insecurity - inability: Not on file  . Transportation needs - medical: Not on file  . Transportation needs - non-medical: Not on file  Occupational History  . Occupation: caregiver  Tobacco Use  . Smoking status: Current Every  Day Smoker    Packs/day: 0.25    Years: 40.00    Pack years: 10.00    Types: Cigarettes  . Smokeless tobacco: Never Used  . Tobacco comment: has cut back  Substance and Sexual Activity  . Alcohol use: Yes    Alcohol/week: 0.0 oz    Comment: occ  . Drug use: No  . Sexual activity: Not Currently    Birth control/protection: None  Other Topics Concern  . Not on file  Social History Narrative   Not employed    Married    One child       [place], walking with dog, she likes to go to [employer].     Ms. Temkin family history includes Breast cancer in her maternal aunt; Cirrhosis in her father; Dementia in her mother; Heart failure in her father; Hypertension in her father and mother; Other in her father; Stroke in her father and mother.      Objective:    Vitals:   11/08/17 1101  BP: 100/60  Pulse: 78    Physical Exam ; well-developed older white female in no acute distress, accompanied by her husband. Patient is anxious blood pressure 100/60 pulse 78, height 5 foot 6, weight 149, BMI 23.5. HEENT; nontraumatic normocephalic EOMI PERRLA sclera anicteric, Cardiovascular ;regular rate and rhythm with S1-S2 no murmur or gallop, Pulmonary; clear bilaterally, Abdomen; soft, nontender nondistended there's no palpable mass or hepatosplenomegaly, bowel sounds are present, Rectal ;exam not done, Extremities; no clubbing cyanosis or edema skin warm and dry, Neuropsych; mood and affect appropriate       Assessment & Plan:   #66 57 year old white female with mild transaminitis present over the past 6 months. She also had one set of minimally elevated LFTs as far back as 2012. Etiology is not clear, suspect this is related to nonalcoholic fatty liver disease. Will rule out autoimmune liver disease and inheritable forms of liver disease. #2 family history of NASH cirrhosis in patient's father now deceased #3 chronic anxiety/panic disorder #4 colon cancer  surveillance-normal colonoscopy May 2016  Plan; schedule for upper abdominal ultrasound Repeat hepatic panel, check ProTime, Ferritin, ceruloplasmin, alpha-1 antitrypsin, ANA, AMA, anti-smooth muscle antibody. Patient does take Klonopin at bedtime which lists elevated LFTs as potential side effect, Celexa has no warning regarding elevated LFTs Patient will be established with Dr. Henrene Pastor, and follow-up will be arranged after above results reviewed.    Amy S Esterwood PA-C 11/08/2017   Cc: Felicia Grandchild, NP

## 2017-11-08 NOTE — Patient Instructions (Addendum)
Please go to the basement level to have your labs drawn.  You have been scheduled for an abdominal ultrasound at [facility] (1st floor of hospital) on Monday 11-15-2017 at 8:30 am. Please arrive at 8:15 am to your appointment for registration. Make certain not to have anything to eat or drink after midnight prior to your appointment. Should you need to reschedule your appointment, please contact radiology at [phone]. This test typically takes about 30 minutes to perform.  You will be established with Dr. Scarlette Shorts.  If you are age 57 or younger, your body mass index should be between 19-25. Your Body mass index is 23.51 kg/m. If this is out of the aformentioned range listed, please consider follow up with your Primary Care Provider.

## 2017-11-09 NOTE — Progress Notes (Signed)
Assessment and plans reviewed  

## 2017-11-10 LAB — CERULOPLASMIN: Ceruloplasmin: 33 mg/dL (ref 18–53)

## 2017-11-10 LAB — MITOCHONDRIAL ANTIBODIES: Mitochondrial M2 Ab, IgG: <=20 U

## 2017-11-10 LAB — ANA: Anti Nuclear Antibody(ANA): NEGATIVE

## 2017-11-10 LAB — ANTI-SMOOTH MUSCLE ANTIBODY, IGG: Actin (Smooth Muscle) Antibody (IGG): <20 U (ref <20)

## 2017-11-10 LAB — ALPHA-1-ANTITRYPSIN: A-1 Antitrypsin, Ser: 101 mg/dL (ref 83–199)

## 2017-11-11 ENCOUNTER — Telehealth: Payer: Self-pay | Admitting: Adult Health

## 2017-11-11 NOTE — Telephone Encounter (Signed)
Patient clled states she wants to quit smoking and wants to try being hypnotized-- seek a referral by PCP-- I set up an appt for her tomorrow 11/12/17 to speak w/ PCP Valetta Fuller Danford).  --glh

## 2017-11-15 ENCOUNTER — Ambulatory Visit (HOSPITAL_COMMUNITY)
Admission: RE | Admit: 2017-11-15 | Discharge: 2017-11-15 | Disposition: A | Discharge status: Home / Self Care | Payer: BLUE CROSS/BLUE SHIELD | Source: Ambulatory Visit | Attending: Physician Assistant | Admitting: Physician Assistant

## 2017-11-15 ENCOUNTER — Ambulatory Visit (INDEPENDENT_AMBULATORY_CARE_PROVIDER_SITE_OTHER): Payer: BLUE CROSS/BLUE SHIELD | Admitting: Adult Health

## 2017-11-15 ENCOUNTER — Encounter: Payer: Self-pay | Admitting: Adult Health

## 2017-11-15 VITALS — BP 99/64 | HR 113 | Ht 66.75 in | Wt 148.7 lb

## 2017-11-15 DIAGNOSIS — Z0000 Encounter for general adult medical examination without abnormal findings: Secondary | ICD-10-CM

## 2017-11-15 DIAGNOSIS — F32A Depression, unspecified: Secondary | ICD-10-CM

## 2017-11-15 DIAGNOSIS — R945 Abnormal results of liver function studies: Secondary | ICD-10-CM | POA: Insufficient documentation

## 2017-11-15 DIAGNOSIS — R7989 Other specified abnormal findings of blood chemistry: Secondary | ICD-10-CM | POA: Diagnosis not present

## 2017-11-15 DIAGNOSIS — F329 Major depressive disorder, single episode, unspecified: Secondary | ICD-10-CM

## 2017-11-15 DIAGNOSIS — F17200 Nicotine dependence, unspecified, uncomplicated: Secondary | ICD-10-CM

## 2017-11-15 NOTE — Progress Notes (Signed)
Subjective:    Patient ID: [name], female    DOB: [birth year], 57 y.o.   MRN: [id number]  HPI: Ms. Dickison presents for smoking cessation and medical clearance letter for "[facility]" equestrian therapy to treat anxiety/depression.  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 She has recently increased regular exercise- 67mins walking four days/week, reducing saturated fat/sugar/CHO and continue to drink plenty of water. She has been using tobacco on/off since age 34, most she smoked daily has been 1/2 pack/day.  She is currently smoking 1-5 cigarettes/day- GREAT! She would like a referral to "[facility]" hypnotist for smoking cessation. She declined Nicoderm patches or other Rx's to aid in cessation today.  She and her husband are "going to quit for good together"- GREAT! She continues abstain from ETOH use.  Seen by GI on 11/08/17- labs and upper abdominal US ordered and she will f/u with Dr. Henrene Pastor after studies completed.  Patient Care Team    Relationship Specialty Notifications Start End  Mina Marble D, NP PCP - General Family Medicine  06/14/17   Sanjuana Kava, MD Referring Physician Obstetrics and Gynecology  06/14/17     Patient Active Problem List   Diagnosis Date Noted  . Oral pharyngeal candidiasis 08/25/2017  . 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 Thrush, oral 07/23/2017  . Elevated liver function tests 07/15/2017  . Hyperlipidemia 07/15/2017  . Anxiety 07/15/2017  . Chronic sinusitis 06/14/2017  . Healthcare maintenance 06/14/2017  . PANIC DISORDER,NO AGORAPHOBIA 10/13/2010  . TOBACCO ABUSE 08/13/2008  . BACK PAIN 01/19/2008  . URTICARIA NOS 08/15/2007  . Depression 06/15/2007     Past Medical History:  Diagnosis Date  . Anxiety   . Depression   . IBS (irritable bowel syndrome)   . Migraine headache   . Mitral valve prolapse   . Panic attacks   . Rectal polyp   . RLS (restless legs syndrome)       Past Surgical History:  Procedure Laterality Date  . rectal polyp removed  1987   Local anesthesia used  . RECTAL POLYPECTOMY       Family History  Problem Relation Age of Onset  . Other Father        esophageal problems  . Heart failure Father   . Cirrhosis Father        Non alcoholic   . Stroke Father        Died in 62   . Hypertension Father   . Hypertension Mother   . Stroke Mother   . Dementia Mother   . Breast cancer Maternal Aunt   . Colon cancer Neg Hx      Social History   Substance and Sexual Activity  Drug Use No     Social History   Substance and Sexual Activity  Alcohol Use Yes  . Alcohol/week: 0.0 oz   Comment: occ     Social History   Tobacco Use  Smoking Status Current Every Day Smoker  . Packs/day: 0.25  . Years: 40.00  . Pack years: 10.00  . Types: Cigarettes  Smokeless Tobacco Never Used  Tobacco Comment   has cut back     Outpatient Encounter Medications as of 11/15/2017  Medication Sig  . Cholecalciferol (VITAMIN D3) 2000 units TABS Take 1 tablet by mouth daily.  . citalopram (CELEXA) 40 MG tablet Take 1 tablet (40 mg total) by mouth daily.  . clonazePAM (KLONOPIN)  0.5 MG tablet Take 1 tablet (0.5 mg total) by mouth at bedtime as needed for anxiety.  . cyclobenzaprine (FLEXERIL) 10 MG tablet Take 10 mg by mouth 3 (three) times daily as needed for muscle spasms.  . fluticasone (FLONASE) 50 MCG/ACT nasal spray Place 1 spray into both nostrils 2 (two) times daily as needed for allergies or rhinitis.  Marland Kitchen GINSENG PO Take 2 capsules by mouth daily.  . Prenatal Vit-Fe Fumarate-FA (PRENATAL VITAMIN PO) Take 1 tablet by mouth daily.  . TRIAMCINOLONE ACETONIDE EX 1 application Topical 2 times daily   No facility-administered encounter medications on file as of 11/15/2017.     Allergies: Patient has no known allergies.  Body mass index is 23.46 kg/m.  Blood pressure 99/64, pulse (!) 113, height 5' 6.75" (1.695 m), weight 148  lb 11.2 oz (67.4 kg), last menstrual period 11/26/2013, SpO2 99 %.    Review of Systems  Constitutional: Positive for fatigue. Negative for activity change, appetite change, chills, diaphoresis, fever and unexpected weight change.  Eyes: Negative for visual disturbance.  Respiratory: Negative for cough, chest tightness, shortness of breath, wheezing and stridor.   Cardiovascular: Negative for chest pain, palpitations and leg swelling.  Gastrointestinal: Negative for abdominal distention, abdominal pain, anal bleeding, constipation, diarrhea, nausea and vomiting.  Endocrine: Negative for cold intolerance, heat intolerance, polydipsia, polyphagia and polyuria.  Genitourinary: Negative for difficulty urinating and flank pain.  Musculoskeletal: Negative for arthralgias, back pain and myalgias.  Skin: Negative for color change, pallor, rash and wound.  Neurological: Negative for dizziness and headaches.  Hematological: Does not bruise/bleed easily.  Psychiatric/Behavioral: Positive for dysphoric mood. Negative for decreased concentration, hallucinations, self-injury, sleep disturbance and suicidal ideas. The patient is nervous/anxious. The patient is not hyperactive.        Objective:   Physical Exam  Constitutional: She appears well-developed and well-nourished. No distress.  HENT:  Head: Normocephalic and atraumatic.  Right Ear: External ear normal.  Left Ear: External ear normal.  Cardiovascular: Normal rate, regular rhythm, normal heart sounds and intact distal pulses.  No murmur heard. Pulmonary/Chest: Effort normal and breath sounds normal. No respiratory distress. She has no wheezes. She has no rales. She exhibits no tenderness.  Musculoskeletal: Normal range of motion.  Skin: Skin is warm and dry. No rash noted. She is not diaphoretic. No erythema. No pallor.  Psychiatric: She has a normal mood and affect. Her behavior is normal. Judgment and thought content normal.  Nursing note  and vitals reviewed.         Assessment & Plan:   1. Healthcare maintenance   2. Elevated liver function tests   3. Depression, unspecified depression type   4. Tobacco use disorder     Healthcare maintenance Continue regular walking, heart healthy diet, and drinking plenty of water. Medical Clearance Letter provided for "[facility]"- VSS, Neuro status intake and overall musculoskeletal fx intact. She is not on anti-coagulants.  We cannot placed a referral to [facility], please call clinic to schedule an appt. Continue all medications as directed. Please continue with GI specialist as directed. GREAT JOB on the regular exercise and smoking cessation! Follow-up in 6 months, sooner if needed.  Depression Continue on Citalopram 96m daily Declined CBT referral Provided Medical Clearance Letter for "[facility]". Continue regular exercise.  Elevated liver function tests Seen bu GI/[facility] 11/08/17 Labs and upper abdominal US ordered  She will f/u with Dr. Henrene Pastor after studies completed. She denies current RUQ pain  TOBACCO  ABUSE We cannot placed a referral to [facility], please call clinic to schedule an appt. She declined Nicoderm patch or rx (Wellbutrin, Chantix) She has been using tobacco on/off since age 22 and the most she has ever smoked in day has been 1/2 pack     FOLLOW-UP:  Return in about 6 months (around 05/15/2018) for Anxiety, Depression.

## 2017-11-15 NOTE — Assessment & Plan Note (Signed)
We cannot placed a referral to [facility], please call clinic to schedule an appt. She declined Nicoderm patch or rx (Wellbutrin, Chantix) She has been using tobacco on/off since age 57 and the most she has ever smoked in day has been 1/2 pack

## 2017-11-15 NOTE — Assessment & Plan Note (Addendum)
Continue regular walking, heart healthy diet, and drinking plenty of water. Medical Clearance Letter provided for "[facility]"- VSS, Neuro status intake and overall musculoskeletal fx intact. She is not on anti-coagulants.  We cannot placed a referral to [facility], please call clinic to schedule an appt. Continue all medications as directed. Please continue with GI specialist as directed. GREAT JOB on the regular exercise and smoking cessation! Follow-up in 6 months, sooner if needed.

## 2017-11-15 NOTE — Patient Instructions (Addendum)
Heart-Healthy Eating Plan Many factors influence your heart health, including eating and exercise habits. Heart (coronary) risk increases with abnormal blood fat (lipid) levels. Heart-healthy meal planning includes limiting unhealthy fats, increasing healthy fats, and making other small dietary changes. This includes maintaining a healthy body weight to help keep lipid levels within a normal range. What is my plan? Your health care provider recommends that you:  Get no more than __25___% of the total calories in your daily diet from fat.  Limit your intake of saturated fat to less than ___5___% of your total calories each day.  Limit the amount of cholesterol in your diet to less than _300__ mg per day.  What types of fat should I choose?  Choose healthy fats more often. Choose monounsaturated and polyunsaturated fats, such as olive oil and canola oil, flaxseeds, walnuts, almonds, and seeds.  Eat more omega-3 fats. Good choices include salmon, mackerel, sardines, tuna, flaxseed oil, and ground flaxseeds. Aim to eat fish at least two times each week.  Limit saturated fats. Saturated fats are primarily found in animal products, such as meats, butter, and cream. Plant sources of saturated fats include palm oil, palm kernel oil, and coconut oil.  Avoid foods with partially hydrogenated oils in them. These contain trans fats. Examples of foods that contain trans fats are stick margarine, some tub margarines, cookies, crackers, and other baked goods. What general guidelines do I need to follow?  Check food labels carefully to identify foods with trans fats or high amounts of saturated fat.  Fill one half of your plate with vegetables and green salads. Eat 4-5 servings of vegetables per day. A serving of vegetables equals 1 cup of raw leafy vegetables,  cup of raw or cooked cut-up vegetables, or  cup of vegetable juice.  Fill one fourth of your plate with whole grains. Look for the word "whole"  as the first word in the ingredient list.  Fill one fourth of your plate with lean protein foods.  Eat 4-5 servings of fruit per day. A serving of fruit equals one medium whole fruit,  cup of dried fruit,  cup of fresh, frozen, or canned fruit, or  cup of 100% fruit juice.  Eat more foods that contain soluble fiber. Examples of foods that contain this type of fiber are apples, broccoli, carrots, beans, peas, and barley. Aim to get 20-30 g of fiber per day.  Eat more home-cooked food and less restaurant, buffet, and fast food.  Limit or avoid alcohol.  Limit foods that are high in starch and sugar.  Avoid fried foods.  Cook foods by using methods other than frying. Baking, boiling, grilling, and broiling are all great options. Other fat-reducing suggestions include: ? Removing the skin from poultry. ? Removing all visible fats from meats. ? Skimming the fat off of stews, soups, and gravies before serving them. ? Steaming vegetables in water or broth.  Lose weight if you are overweight. Losing just 5-10% of your initial body weight can help your overall health and prevent diseases such as diabetes and heart disease.  Increase your consumption of nuts, legumes, and seeds to 4-5 servings per week. One serving of dried beans or legumes equals  cup after being cooked, one serving of nuts equals 1 ounces, and one serving of seeds equals  ounce or 1 tablespoon.  You may need to monitor your salt (sodium) intake, especially if you have high blood pressure. Talk with your health care provider or dietitian to get  more information about reducing sodium. What foods can I eat? Grains  Breads, including [place], white, pita, wheat, raisin, rye, oatmeal, and [place]. Tortillas that are neither fried nor made with lard or trans fat. Low-fat rolls, including hotdog and hamburger buns and English muffins. Biscuits. Muffins. Waffles. Pancakes. Light popcorn. Whole-grain cereals. Flatbread. Melba  toast. Pretzels. Breadsticks. Rusks. Low-fat snacks and crackers, including oyster, saltine, matzo, graham, animal, and rye. Rice and pasta, including brown rice and those that are made with whole wheat. Vegetables All vegetables. Fruits All fruits, but limit coconut. Meats and Other Protein Sources Lean, well-trimmed beef, veal, pork, and lamb. Chicken and [place] without skin. All fish and shellfish. Wild duck, rabbit, pheasant, and venison. Egg whites or low-cholesterol egg substitutes. Dried beans, peas, lentils, and tofu.Seeds and most nuts. Dairy Low-fat or nonfat cheeses, including ricotta, string, and mozzarella. Skim or 1% milk that is liquid, powdered, or evaporated. Buttermilk that is made with low-fat milk. Nonfat or low-fat yogurt. Beverages Mineral water. Diet carbonated beverages. Sweets and Desserts Sherbets and fruit ices. Honey, jam, marmalade, jelly, and syrups. Meringues and gelatins. Pure sugar candy, such as hard candy, jelly beans, gumdrops, mints, marshmallows, and small amounts of dark chocolate. [employer] Eat all sweets and desserts in moderation. Fats and Oils Nonhydrogenated (trans-free) margarines. Vegetable oils, including soybean, sesame, sunflower, olive, peanut, safflower, corn, canola, and cottonseed. Salad dressings or mayonnaise that are made with a vegetable oil. Limit added fats and oils that you use for cooking, baking, salads, and as spreads. Other Cocoa powder. Coffee and tea. All seasonings and condiments. The items listed above may not be a complete list of recommended foods or beverages. Contact your dietitian for more options. What foods are not recommended? Grains Breads that are made with saturated or trans fats, oils, or whole milk. Croissants. Butter rolls. Cheese breads. Sweet rolls. Donuts. Buttered popcorn. Chow mein noodles. High-fat crackers, such as cheese or butter crackers. Meats and Other Protein Sources Fatty meats, such as  hotdogs, short ribs, sausage, spareribs, bacon, ribeye roast or steak, and mutton. High-fat deli meats, such as salami and bologna. Caviar. Domestic duck and goose. Organ meats, such as kidney, liver, sweetbreads, brains, gizzard, chitterlings, and heart. Dairy Cream, sour cream, cream cheese, and creamed cottage cheese. Whole milk cheeses, including blue (bleu), Monterey Jack, [place], [place], American, [place], Swiss, [place], [place], and [place]. Whole or 2% milk that is liquid, evaporated, or condensed. Whole buttermilk. Cream sauce or high-fat cheese sauce. Yogurt that is made from whole milk. Beverages Regular sodas and drinks with added sugar. Sweets and Desserts Frosting. Pudding. Cookies. Cakes other than angel food cake. Candy that has milk chocolate or white chocolate, hydrogenated fat, butter, coconut, or unknown ingredients. Buttered syrups. Full-fat ice cream or ice cream drinks. Fats and Oils Gravy that has suet, meat fat, or shortening. Cocoa butter, hydrogenated oils, palm oil, coconut oil, palm kernel oil. These can often be found in baked products, candy, fried foods, nondairy creamers, and whipped toppings. Solid fats and shortenings, including bacon fat, salt pork, lard, and butter. Nondairy cream substitutes, such as coffee creamers and sour cream substitutes. Salad dressings that are made of unknown oils, cheese, or sour cream. The items listed above may not be a complete list of foods and beverages to avoid. Contact your dietitian for more information. This information is not intended to replace advice given to you by your health care provider. Make sure you discuss any questions you have with your health care  provider. Document Released: 07/21/2008 Document Revised: 05/01/2016 Document Reviewed: 04/05/2014 Elsevier Interactive Patient Education  2018 [employer].   Tobacco Use Disorder Tobacco use disorder (TUD) is a mental disorder. It is the long-term use of tobacco in  spite of related health problems or difficulty with normal life activities. Tobacco is most commonly smoked as cigarettes and less commonly as cigars or pipes. Smokeless chewing tobacco and snuff are also popular. People with TUD get a feeling of extreme pleasure (euphoria) from using tobacco and have a desire to use it again and again. Repeated use of tobacco can cause problems. The addictive effects of tobacco are due mainly tothe ingredient nicotine. Nicotine also causes a rush of adrenaline (epinephrine) in the body. This leads to increased blood pressure, heart rate, and breathing rate. These changes may cause problems for people with high blood pressure, weak hearts, or lung disease. High doses of nicotine in children and pets can lead to seizures and death. Tobacco contains a number of other unsafe chemicals. These chemicals are especially harmful when inhaled as smoke and can damage almost every organ in the body. Smokers live shorter lives than nonsmokers and are at risk of dying from a number of diseases and cancers. Tobacco smoke can also cause health problems for nonsmokers (due to inhaling secondhand smoke). Smoking is also a fire hazard. TUD usually starts in the late teenage years and is most common in young adults between the ages of 64 and 25 years. People who start smoking earlier in life are more likely to continue smoking as adults. TUD is somewhat more common in men than women. People with TUD are at higher risk for using alcohol and other drugs of abuse. What increases the risk? Risk factors for TUD include:  Having family members with the disorder.  Being around people who use tobacco.  Having an existing mental health issue such as schizophrenia, depression, bipolar disorder, ADHD, or posttraumatic stress disorder (PTSD).  What are the signs or symptoms? People with tobacco use disorder have two or more of the following signs and symptoms within 12 months:  Use of more  tobacco over a longer period than intended.  Not able to cut down or control tobacco use.  A lot of time spent obtaining or using tobacco.  Strong desire or urge to use tobacco (craving). Cravings may last for 6 months or longer after quitting.  Use of tobacco even when use leads to major problems at work, school, or home.  Use of tobacco even when use leads to relationship problems.  Giving up or cutting down on important life activities because of tobacco use.  Repeatedly using tobacco in situations where it puts you or others in physical danger, like smoking in bed.  Use of tobacco even when it is known that a physical or mental problem is likely related to tobacco use. ? Physical problems are numerous and may include chronic bronchitis, emphysema, lung and other cancers, gum disease, high blood pressure, heart disease, and stroke. ? Mental problems caused by tobacco may include difficulty sleeping and anxiety.  Need to use greater amounts of tobacco to get the same effect. This means you have developed a tolerance.  Withdrawal symptoms as a result of stopping or rapidly cutting back use. These symptoms may last a month or more after quitting and include the following: ? Depressed, anxious, or irritable mood. ? Difficulty concentrating. ? Increased appetite. ? Restlessness or trouble sleeping. ? Use of tobacco to avoid withdrawal symptoms.  How is this diagnosed? Tobacco use disorder is diagnosed by your health care provider. A diagnosis may be made by:  Your health care provider asking questions about your tobacco use and any problems it may be causing.  A physical exam.  Lab tests.  You may be referred to a mental health professional or addiction specialist.  The severity of tobacco use disorder depends on the number of signs and symptoms you have:  Mild-Two or three symptoms.  Moderate-Four or five symptoms.  Severe-Six or more symptoms.  How is this  treated? Many people with tobacco use disorder are unable to quit on their own and need help. Treatment options include the following:  Nicotine replacement therapy (NRT). NRT provides nicotine without the other harmful chemicals in tobacco. NRT gradually lowers the dosage of nicotine in the body and reduces withdrawal symptoms. NRT is available in over-the-counter forms (gum, lozenges, and skin patches) as well as prescription forms (mouth inhaler and nasal spray).  Medicines.This may include: ? Antidepressant medicine that may reduce nicotine cravings. ? A medicine that acts on nicotine receptors in the brain to reduce cravings and withdrawal symptoms. It may also block the effects of tobacco in people with TUD who relapse.  Counseling or talk therapy. A form of talk therapy called behavioral therapy is commonly used to treat people with TUD. Behavioral therapy looks at triggers for tobacco use, how to avoid them, and how to cope with cravings. It is most effective in person or by phone but is also available in self-help forms (books and Internet websites).  Support groups. These provide emotional support, advice, and guidance for quitting tobacco.  The most effective treatment for TUD is usually a combination of medicine, talk therapy, and support groups. Follow these instructions at home:  Keep all follow-up visits as directed by your health care provider. This is important.  Take medicines only as directed by your health care provider.  Check with your health care provider before starting new prescription or over-the-counter medicines. Contact a health care provider if:  You are not able to take your medicines as prescribed.  Treatment is not helping your TUD and your symptoms get worse. Get help right away if:  You have serious thoughts about hurting yourself or others.  You have trouble breathing, chest pain, sudden weakness, or sudden numbness in part of your body. This  information is not intended to replace advice given to you by your health care provider. Make sure you discuss any questions you have with your health care provider. Document Released: 06/17/2004 Document Revised: 06/14/2016 Document Reviewed: 12/08/2013 Elsevier Interactive Patient Education  2018 [employer] regular walking, heart healthy diet, and drinking plenty of water. Medical Clearance Letter provided for "[facility]". We cannot placed a referral to [facility], please call clinic to schedule an appt. Continue all medications as directed. Please continue with GI specialist as directed. GREAT JOB on the regular exercise and smoking cessation! Follow-up in 6 months, sooner if needed. NICE TO SEE YOU!

## 2017-11-15 NOTE — Assessment & Plan Note (Signed)
Continue on Citalopram 60m daily Declined CBT referral Provided Medical Clearance Letter for "[facility]". Continue regular exercise.

## 2017-11-15 NOTE — Assessment & Plan Note (Signed)
Seen bu GI/[facility] 11/08/17 Labs and upper abdominal US ordered  She will f/u with Dr. Henrene Pastor after studies completed. She denies current RUQ pain

## 2017-11-17 ENCOUNTER — Telehealth: Payer: Self-pay | Admitting: Adult Health

## 2017-11-17 ENCOUNTER — Other Ambulatory Visit: Payer: Self-pay

## 2017-11-17 DIAGNOSIS — R945 Abnormal results of liver function studies: Principal | ICD-10-CM

## 2017-11-17 DIAGNOSIS — R7989 Other specified abnormal findings of blood chemistry: Secondary | ICD-10-CM

## 2017-11-17 NOTE — Telephone Encounter (Signed)
Patient called to say radiologist that read Ultrasound has listed results for PCP to review--- Patient thinks Elevated liver panel is due to anxiety medication which she only takes PRN-- patient request provider/ nurse cll her at [phone] to discuss possible changes in medication. ---forwarding message to medical assistant for review. --glh

## 2017-11-18 NOTE — Telephone Encounter (Signed)
Patient advised to schedule a follow up appointment.  

## 2017-11-18 NOTE — Progress Notes (Signed)
Subjective:    Patient ID: Felicia Good, female    DOB: 01/16/1961, 57 y.o.   MRN: [id number]  HPI: 11/15/17 OV: Felicia Good presents for smoking cessation and medical clearance letter for "[facility]" equestrian therapy to treat anxiety/depression.  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 She has recently increased regular exercise- 31mins walking four days/week, reducing saturated fat/sugar/CHO and continue to drink plenty of water. She has been using tobacco on/off since age 89, most she smoked daily has been 1/2 pack/day.  She is currently smoking 1-5 cigarettes/day- GREAT! She would like a referral to "[facility]" hypnotist for smoking cessation. She declined Nicoderm patches or other Rx's to aid in cessation today.  She and her husband are "going to quit for good together"- GREAT! She continues abstain from ETOH use.  Seen by GI on 11/08/17- labs and upper abdominal US ordered and she will f/u with Dr. Henrene Pastor after studies completed.  11/22/17 OV: Felicia Good is here to discuss elevated LFTS and discuss PRN Clonazepam 0.5mg  for anxiety QHS. Last rx was filled 07/15/17, 30 count- 4 months ago, she estimates to use 1 tablet every 1-2 weeks.  She has been using this anxiolytic for >5 years. She had full GI work-up this fall with [facility], labs and Abdominal US all normal.  Recommended to repeat Hepatic Panel in 3 months, approx 4/19  Last LFTs  10/21/17: Alkaline Phosphatase 39 - 117 IU/L 80  74  66 R  AST 0 - 40 IU/L 34  36  41 R  ALT 0 - 32 IU/L 52 Abnormally high   38 Abnormally high   64 Abnormally high  R    GI provider recommended:  " Please let pt know her [place] is normal - which is great . I think we should have her get a hepatic panel done in 3 months - then can decide on need for follow up office visit - Klonopin which she takes at bedtime does list elevated liver tests as possible side effect - if she can , she  may want to decrease dose or discuss with her primary care Provider about stopping"  She had 3/4 beer over the weekend, however cannot recall last time she drank ETOH She denies regular use of Acetaminophen  Patient Care Team    Relationship Specialty Notifications Start End  Esaw Grandchild, NP PCP - General Family Medicine  06/14/17   Sanjuana Kava, MD Referring Physician Obstetrics and Gynecology  06/14/17     Patient Active Problem List   Diagnosis Date Noted  . Oral pharyngeal candidiasis 08/25/2017  . 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 Thrush, oral 07/23/2017  . Elevated liver function tests 07/15/2017  . Hyperlipidemia 07/15/2017  . Anxiety 07/15/2017  . Chronic sinusitis 06/14/2017  . Healthcare maintenance 06/14/2017  . PANIC DISORDER,NO AGORAPHOBIA 10/13/2010  . TOBACCO ABUSE 08/13/2008  . BACK PAIN 01/19/2008  . URTICARIA NOS 08/15/2007  . Depression 06/15/2007     Past Medical History:  Diagnosis Date  . Anxiety   . Depression   . IBS (irritable bowel syndrome)   . Migraine headache   . Mitral valve prolapse   . Panic attacks   . Rectal polyp   . RLS (restless legs syndrome)      Past Surgical History:  Procedure Laterality Date  . rectal polyp removed  1987   Local anesthesia used  . RECTAL POLYPECTOMY  Family History  Problem Relation Age of Onset  . Other Father        esophageal problems  . Heart failure Father   . Cirrhosis Father        Non alcoholic   . Stroke Father        Died in 104   . Hypertension Father   . Hypertension Mother   . Stroke Mother   . Dementia Mother   . Breast cancer Maternal Aunt   . Colon cancer Neg Hx      Social History   Substance and Sexual Activity  Drug Use No     Social History   Substance and Sexual Activity  Alcohol Use Yes  . Alcohol/week: 0.0 oz   Comment: occ     Social History   Tobacco Use  Smoking Status Current Every Day Smoker  . Packs/day: 0.25  . Years: 40.00  . Pack  years: 10.00  . Types: Cigarettes  Smokeless Tobacco Never Used  Tobacco Comment   has cut back     Outpatient Encounter Medications as of 11/22/2017  Medication Sig  . Cholecalciferol (VITAMIN D3) 2000 units TABS Take 1 tablet by mouth daily.  . citalopram (CELEXA) 40 MG tablet Take 1 tablet (40 mg total) by mouth daily.  . clonazePAM (KLONOPIN) 0.5 MG tablet Take 1 tablet (0.5 mg total) by mouth at bedtime as needed for anxiety.  . cyclobenzaprine (FLEXERIL) 10 MG tablet Take 10 mg by mouth 3 (three) times daily as needed for muscle spasms.  . fluticasone (FLONASE) 50 MCG/ACT nasal spray Place 1 spray into both nostrils 2 (two) times daily as needed for allergies or rhinitis.  Marland Kitchen GINSENG PO Take 2 capsules by mouth daily.  . Prenatal Vit-Fe Fumarate-FA (PRENATAL VITAMIN PO) Take 1 tablet by mouth daily.  . TRIAMCINOLONE ACETONIDE EX 1 application Topical 2 times daily   No facility-administered encounter medications on file as of 11/22/2017.     Allergies: Patient has no known allergies.  There is no height or weight on file to calculate BMI.  Last menstrual period 11/26/2013.    Review of Systems  Constitutional: Positive for fatigue. Negative for activity change, appetite change, chills, diaphoresis, fever and unexpected weight change.  Eyes: Negative for visual disturbance.  Respiratory: Negative for cough, chest tightness, shortness of breath, wheezing and stridor.   Cardiovascular: Negative for chest pain, palpitations and leg swelling.  Gastrointestinal: Negative for abdominal distention, abdominal pain, anal bleeding, constipation, diarrhea, nausea and vomiting.  Endocrine: Negative for cold intolerance, heat intolerance, polydipsia, polyphagia and polyuria.  Genitourinary: Negative for difficulty urinating and flank pain.  Musculoskeletal: Negative for arthralgias, back pain and myalgias.  Skin: Negative for color change, pallor, rash and wound.  Neurological: Negative  for dizziness and headaches.  Hematological: Does not bruise/bleed easily.  Psychiatric/Behavioral: Positive for dysphoric mood. Negative for decreased concentration, hallucinations, self-injury, sleep disturbance and suicidal ideas. The patient is nervous/anxious. The patient is not hyperactive.        Objective:   Physical Exam  Constitutional: She appears well-developed and well-nourished. No distress.  HENT:  Head: Normocephalic and atraumatic.  Right Ear: External ear normal.  Left Ear: External ear normal.  Cardiovascular: Normal rate, regular rhythm, normal heart sounds and intact distal pulses.  No murmur heard. Pulmonary/Chest: Effort normal and breath sounds normal. No respiratory distress. She has no wheezes. She has no rales. She exhibits no tenderness.  Abdominal: Soft. Bowel sounds are normal. She exhibits no distension,  no ascites and no mass. There is no hepatosplenomegaly. There is no tenderness. There is no rigidity, no rebound, no guarding and no CVA tenderness.  Musculoskeletal: Normal range of motion.  Skin: Skin is warm and dry. No rash noted. She is not diaphoretic. No erythema. No pallor.  Psychiatric: She has a normal mood and affect. Her behavior is normal. Judgment and thought content normal.  Nursing note and vitals reviewed.         Assessment & Plan:   1. Elevated liver function tests   2. Anxiety     Anxiety Increase water intake and avoid liver toxic substances- alcohol, OTC Acetaminophen. Recommend  OTC Melatonin for evening anxiety/insomnia. Since you are using Clonazepam 0.5mg  so sparingly (1 tab every 1-2 weeks), it is okay to continue. Please keep follow-up appt with GI specialist. If you notice any yellowing of skin/eyes, nausea/vomting, change in bowel habits of Right Upper Quadrant abdominal - please call GI specialist immediately.  Elevated liver function tests Last LFTs  10/21/17: Alkaline Phosphatase 39 - 117 IU/L 80  74  66 R   AST 0 - 40 IU/L 34  36  41 R  ALT 0 - 32 IU/L 52 Abnormally high   38 Abnormally high   64 Abnormally high  R   She had complete workup with- labs and [place]- normal. She will have LFTs re-checked with GI    FOLLOW-UP:  Return if symptoms worsen or fail to improve.

## 2017-11-22 ENCOUNTER — Ambulatory Visit: Payer: BLUE CROSS/BLUE SHIELD | Admitting: Adult Health

## 2017-11-22 ENCOUNTER — Encounter: Payer: Self-pay | Admitting: Adult Health

## 2017-11-22 VITALS — BP 110/65 | HR 75 | Ht 66.75 in | Wt 149.0 lb

## 2017-11-22 DIAGNOSIS — F419 Anxiety disorder, unspecified: Secondary | ICD-10-CM

## 2017-11-22 DIAGNOSIS — R7989 Other specified abnormal findings of blood chemistry: Secondary | ICD-10-CM

## 2017-11-22 DIAGNOSIS — R945 Abnormal results of liver function studies: Secondary | ICD-10-CM

## 2017-11-22 NOTE — Patient Instructions (Signed)
Generalized Anxiety Disorder, Adult Generalized anxiety disorder (GAD) is a mental health disorder. People with this condition constantly worry about everyday events. Unlike normal anxiety, worry related to GAD is not triggered by a specific event. These worries also do not fade or get better with time. GAD interferes with life functions, including relationships, work, and school. GAD can vary from mild to severe. People with severe GAD can have intense waves of anxiety with physical symptoms (panic attacks). What are the causes? The exact cause of GAD is not known. What increases the risk? This condition is more likely to develop in:  Women.  People who have a family history of anxiety disorders.  People who are very shy.  People who experience very stressful life events, such as the death of a loved one.  People who have a very stressful family environment.  What are the signs or symptoms? People with GAD often worry excessively about many things in their lives, such as their health and family. They may also be overly concerned about:  Doing well at work.  Being on time.  Natural disasters.  Friendships.  Physical symptoms of GAD include:  Fatigue.  Muscle tension or having muscle twitches.  Trembling or feeling shaky.  Being easily startled.  Feeling like your heart is pounding or racing.  Feeling out of breath or like you cannot take a deep breath.  Having trouble falling asleep or staying asleep.  Sweating.  Nausea, diarrhea, or irritable bowel syndrome (IBS).  Headaches.  Trouble concentrating or remembering facts.  Restlessness.  Irritability.  How is this diagnosed? Your health care provider can diagnose GAD based on your symptoms and medical history. You will also have a physical exam. The health care provider will ask specific questions about your symptoms, including how severe they are, when they started, and if they come and go. Your health care  provider may ask you about your use of alcohol or drugs, including prescription medicines. Your health care provider may refer you to a mental health specialist for further evaluation. Your health care provider will do a thorough examination and may perform additional tests to rule out other possible causes of your symptoms. To be diagnosed with GAD, a person must have anxiety that:  Is out of his or her control.  Affects several different aspects of his or her life, such as work and relationships.  Causes distress that makes him or her unable to take part in normal activities.  Includes at least three physical symptoms of GAD, such as restlessness, fatigue, trouble concentrating, irritability, muscle tension, or sleep problems.  Before your health care provider can confirm a diagnosis of GAD, these symptoms must be present more days than they are not, and they must last for six months or longer. How is this treated? The following therapies are usually used to treat GAD:  Medicine. Antidepressant medicine is usually prescribed for long-term daily control. Antianxiety medicines may be added in severe cases, especially when panic attacks occur.  Talk therapy (psychotherapy). Certain types of talk therapy can be helpful in treating GAD by providing support, education, and guidance. Options include: ? Cognitive behavioral therapy (CBT). People learn coping skills and techniques to ease their anxiety. They learn to identify unrealistic or negative thoughts and behaviors and to replace them with positive ones. ? Acceptance and commitment therapy (ACT). This treatment teaches people how to be mindful as a way to cope with unwanted thoughts and feelings. ? Biofeedback. This process trains you to   manage your body's response (physiological response) through breathing techniques and relaxation methods. You will work with a therapist while machines are used to monitor your physical symptoms.  Stress  management techniques. These include yoga, meditation, and exercise.  A mental health specialist can help determine which treatment is best for you. Some people see improvement with one type of therapy. However, other people require a combination of therapies. Follow these instructions at home:  Take over-the-counter and prescription medicines only as told by your health care provider.  Try to maintain a normal routine.  Try to anticipate stressful situations and allow extra time to manage them.  Practice any stress management or self-calming techniques as taught by your health care provider.  Do not punish yourself for setbacks or for not making progress.  Try to recognize your accomplishments, even if they are small.  Keep all follow-up visits as told by your health care provider. This is important. Contact a health care provider if:  Your symptoms do not get better.  Your symptoms get worse.  You have signs of depression, such as: ? A persistently sad, cranky, or irritable mood. ? Loss of enjoyment in activities that used to bring you joy. ? Change in weight or eating. ? Changes in sleeping habits. ? Avoiding friends or family members. ? Loss of energy for normal tasks. ? Feelings of guilt or worthlessness. Get help right away if:  You have serious thoughts about hurting yourself or others. If you ever feel like you may hurt yourself or others, or have thoughts about taking your own life, get help right away. You can go to your nearest emergency department or call:  Your local emergency services (911 in the [place]  A suicide crisis helpline, such as the [facility] at (725)729-5274. This is open 24 hours a day.  Summary  Generalized anxiety disorder (GAD) is a mental health disorder that involves worry that is not triggered by a specific event.  People with GAD often worry excessively about many things in their lives, such as their health and  family.  GAD may cause physical symptoms such as restlessness, trouble concentrating, sleep problems, frequent sweating, nausea, diarrhea, headaches, and trembling or muscle twitching.  A mental health specialist can help determine which treatment is best for you. Some people see improvement with one type of therapy. However, other people require a combination of therapies. This information is not intended to replace advice given to you by your health care provider. Make sure you discuss any questions you have with your health care provider. Document Released: 02/06/2013 Document Revised: 09/01/2016 Document Reviewed: 09/01/2016 Elsevier Interactive Patient Education  2018 [employer].   Increase water intake and avoid liver toxic substances- alcohol, OTC Acetaminophen. Recommend  OTC Melatonin for evening anxiety/insomnia. Since you are using Clonazepam 0.5mg  so sparingly (1 tab every 1-2 weeks), it is okay to continue. Please keep follow-up appt with GI specialist. If you notice any yellowing of skin/eyes, nausea/vomting, change in bowel habits of Right Upper Quadrant abdominal - please call GI specialist immediately. NICE TO SEE YOU!

## 2017-11-22 NOTE — Assessment & Plan Note (Signed)
Increase water intake and avoid liver toxic substances- alcohol, OTC Acetaminophen. Recommend  OTC Melatonin for evening anxiety/insomnia. Since you are using Clonazepam 0.5mg  so sparingly (1 tab every 1-2 weeks), it is okay to continue. Please keep follow-up appt with GI specialist. If you notice any yellowing of skin/eyes, nausea/vomting, change in bowel habits of Right Upper Quadrant abdominal - please call GI specialist immediately.

## 2017-11-22 NOTE — Assessment & Plan Note (Signed)
Last LFTs  10/21/17: Alkaline Phosphatase 39 - 117 IU/L 80  74  66 R  AST 0 - 40 IU/L 34  36  41 R  ALT 0 - 32 IU/L 52 Abnormally high   38 Abnormally high   64 Abnormally high  R   She had complete workup with- labs and [place]- normal. She will have LFTs re-checked with GI

## 2018-01-03 DIAGNOSIS — Z6822 Body mass index (BMI) 22.0-22.9, adult: Secondary | ICD-10-CM | POA: Diagnosis not present

## 2018-01-03 DIAGNOSIS — Z1231 Encounter for screening mammogram for malignant neoplasm of breast: Secondary | ICD-10-CM | POA: Diagnosis not present

## 2018-01-03 DIAGNOSIS — Z01419 Encounter for gynecological examination (general) (routine) without abnormal findings: Secondary | ICD-10-CM | POA: Diagnosis not present

## 2018-02-14 ENCOUNTER — Other Ambulatory Visit (INDEPENDENT_AMBULATORY_CARE_PROVIDER_SITE_OTHER): Payer: BLUE CROSS/BLUE SHIELD

## 2018-02-14 DIAGNOSIS — R945 Abnormal results of liver function studies: Secondary | ICD-10-CM

## 2018-02-14 DIAGNOSIS — R7989 Other specified abnormal findings of blood chemistry: Secondary | ICD-10-CM

## 2018-02-14 LAB — HEPATIC FUNCTION PANEL
ALBUMIN: 4.3 g/dL (ref 3.5–5.2)
ALT: 32 U/L (ref 0–35)
AST: 30 U/L (ref 0–37)
Alkaline Phosphatase: 57 U/L (ref 39–117)
BILIRUBIN TOTAL: 0.3 mg/dL (ref 0.2–1.2)
Bilirubin, Direct: 0.1 mg/dL (ref 0.0–0.3)
Total Protein: 7.1 g/dL (ref 6.0–8.3)

## 2018-04-26 ENCOUNTER — Other Ambulatory Visit: Payer: Self-pay | Admitting: Adult Health

## 2018-05-12 NOTE — Progress Notes (Signed)
Subjective:    Patient ID: Felicia Good, female    DOB: 12-19-1960, 57 y.o.   MRN: [id number]  HPI: 11/15/17 OV: Felicia Good presents for smoking cessation and medical clearance letter for "[facility] [facility]" equestrian therapy to treat anxiety/depression.  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 She has recently increased regular exercise- 64mins walking four days/week, reducing saturated fat/sugar/CHO and continue to drink plenty of water. She has been using tobacco on/off since age 88, most she smoked daily has been 1/2 pack/day.  She is currently smoking 1-5 cigarettes/day- GREAT! She would like a referral to "[facility] [facility]" hypnotist for smoking cessation. She declined Nicoderm patches or other Rx's to aid in cessation today.  She and her husband are "going to quit for good together"- GREAT! She continues abstain from ETOH use.  Seen by GI on 11/08/17- labs and upper abdominal US ordered and she will f/u with Dr. Henrene Pastor after studies completed.  11/22/17 OV: Felicia Good is here to discuss elevated LFTS and discuss PRN Clonazepam 0.5mg  for anxiety QHS. Last rx was filled 07/15/17, 30 count- 4 months ago, she estimates to use 1 tablet every 1-2 weeks.  She has been using this anxiolytic for >5 years. She had full GI work-up this fall with [facility], labs and Abdominal US all normal.  Recommended to repeat Hepatic Panel in 3 months, approx 4/19  Last LFTs  10/21/17: Alkaline Phosphatase 39 - 117 IU/L 80  74  66 R  AST 0 - 40 IU/L 34  36  41 R  ALT 0 - 32 IU/L 52 Abnormally high   38 Abnormally high   64 Abnormally high  R    GI provider recommended:  " Please let pt know her [place] is normal - which is great . I think we should have her get a hepatic panel done in 3 months - then can decide on need for follow up office visit - Klonopin which she takes at bedtime does list elevated liver tests as possible side effect - if she can , she  may want to decrease dose or discuss with her primary care Provider about stopping"  She had 3/4 beer over the weekend, however cannot recall last time she drank ETOH She denies regular use of Acetaminophen  05/16/18 OV: Felicia Good is here for regular f/u: Elevated LFTs and anxiety 02/14/18 Hepatic Panel- WNL She denies abdominal pain or change in bowel habits She reports mood is stable with Citalopram 40mg - she reports taking 1/2 to full tablet nightly and has been half-ing the dose for the last 6 months, denies withdrawal sx's She reports using clonazepam 0.5mg  1/week, usually when she has to leave the house She drinks >80 oz water/day She remains active with daily dancing and lifting/chasing her granddaughter She continues to use tobacco- 20 cigarettes will last 3-4 days She declines referral to CBT She declines smoking cessation She denies thoughts of harming herself/others She feels overwhelmed at times for caring for her granddaughter and elderly mother   Patient Care Team    Relationship Specialty Notifications Start End  Esaw Grandchild, NP PCP - General Family Medicine  06/14/17   Sanjuana Kava, MD Referring Physician Obstetrics and Gynecology  06/14/17     Patient Active Problem List   Diagnosis Date Noted  . Oral pharyngeal candidiasis 08/25/2017  . 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 Thrush, oral 07/23/2017  . Elevated liver  function tests 07/15/2017  . Hyperlipidemia 07/15/2017  . Anxiety 07/15/2017  . Chronic sinusitis 06/14/2017  . Healthcare maintenance 06/14/2017  . PANIC DISORDER,NO AGORAPHOBIA 10/13/2010  . TOBACCO ABUSE 08/13/2008  . BACK PAIN 01/19/2008  . URTICARIA NOS 08/15/2007  . Depression 06/15/2007     Past Medical History:  Diagnosis Date  . Anxiety   . Depression   . IBS (irritable bowel syndrome)   . Migraine headache   . Mitral valve prolapse   . Panic attacks   . Rectal polyp   . RLS (restless legs syndrome)      Past Surgical History:   Procedure Laterality Date  . rectal polyp removed  1987   Local anesthesia used  . RECTAL POLYPECTOMY       Family History  Problem Relation Age of Onset  . Other Father        esophageal problems  . Heart failure Father   . Cirrhosis Father        Non alcoholic   . Stroke Father        Died in 58   . Hypertension Father   . Hypertension Mother   . Stroke Mother   . Dementia Mother   . Breast cancer Maternal Aunt   . Colon cancer Neg Hx      Social History   Substance and Sexual Activity  Drug Use No     Social History   Substance and Sexual Activity  Alcohol Use Yes  . Alcohol/week: 0.0 oz   Comment: occ     Social History   Tobacco Use  Smoking Status Current Every Day Smoker  . Packs/day: 0.25  . Years: 40.00  . Pack years: 10.00  . Types: Cigarettes  Smokeless Tobacco Never Used  Tobacco Comment   has cut back     Outpatient Encounter Medications as of 05/16/2018  Medication Sig  . Cholecalciferol (VITAMIN D3) 2000 units TABS Take 1 tablet by mouth daily.  . citalopram (CELEXA) 40 MG tablet TAKE 1 TABLET BY MOUTH DAILY.  . clonazePAM (KLONOPIN) 0.5 MG tablet Take 1 tablet (0.5 mg total) by mouth at bedtime as needed for anxiety.  . cyclobenzaprine (FLEXERIL) 10 MG tablet Take 10 mg by mouth 3 (three) times daily as needed for muscle spasms.  . fluticasone (FLONASE) 50 MCG/ACT nasal spray Place 1 spray into both nostrils 2 (two) times daily as needed for allergies or rhinitis.  . Prenatal Vit-Fe Fumarate-FA (PRENATAL VITAMIN PO) Take 1 tablet by mouth daily.  . TRIAMCINOLONE ACETONIDE EX 1 application Topical 2 times daily  . [DISCONTINUED] clonazePAM (KLONOPIN) 0.5 MG tablet Take 1 tablet (0.5 mg total) by mouth at bedtime as needed for anxiety.  . [DISCONTINUED] GINSENG PO Take 2 capsules by mouth daily.   No facility-administered encounter medications on file as of 05/16/2018.     Allergies: Patient has no known allergies.  Body mass  index is 23.45 kg/m.  Blood pressure 99/63, pulse 69, height 5' 6.75" (1.695 m), weight 148 lb 9.6 oz (67.4 kg), last menstrual period 11/26/2013, SpO2 98 %.  Review of Systems  Constitutional: Positive for fatigue. Negative for activity change, appetite change, chills, diaphoresis, fever and unexpected weight change.  Eyes: Negative for visual disturbance.  Respiratory: Negative for cough, chest tightness, shortness of breath, wheezing and stridor.   Cardiovascular: Negative for chest pain, palpitations and leg swelling.  Gastrointestinal: Negative for abdominal distention, abdominal pain, anal bleeding, constipation, diarrhea, nausea and vomiting.  Endocrine: Negative for  cold intolerance, heat intolerance, polydipsia, polyphagia and polyuria.  Genitourinary: Negative for difficulty urinating and flank pain.  Musculoskeletal: Negative for arthralgias, back pain and myalgias.  Skin: Negative for color change, pallor, rash and wound.  Neurological: Negative for dizziness and headaches.  Hematological: Does not bruise/bleed easily.  Psychiatric/Behavioral: Positive for dysphoric mood. Negative for decreased concentration, hallucinations, self-injury, sleep disturbance and suicidal ideas. The patient is nervous/anxious. The patient is not hyperactive.        Objective:   Physical Exam  Constitutional: She appears well-developed and well-nourished. No distress.  HENT:  Head: Normocephalic and atraumatic.  Right Ear: External ear normal.  Left Ear: External ear normal.  Cardiovascular: Normal rate, regular rhythm, normal heart sounds and intact distal pulses.  No murmur heard. Pulmonary/Chest: Effort normal and breath sounds normal. No respiratory distress. She has no wheezes. She has no rales. She exhibits no tenderness.  Abdominal: Soft. Bowel sounds are normal. She exhibits no distension, no ascites and no mass. There is no hepatosplenomegaly. There is no tenderness. There is no  rigidity, no rebound, no guarding and no CVA tenderness.  Musculoskeletal: Normal range of motion.  Skin: Skin is warm and dry. No rash noted. She is not diaphoretic. No erythema. No pallor.  Psychiatric: She has a normal mood and affect. Her behavior is normal. Judgment and thought content normal.  Nursing note and vitals reviewed.      Assessment & Plan:   1. TOBACCO ABUSE   2. Healthcare maintenance   3. Elevated liver function tests   4. Depression, unspecified depression type   5. Anxiety     Healthcare maintenance Please continue all medications as directed. Continue to stay well hydrated and follow Mediterranean diet. Continue to move as often as possible. Reduce-stop tobacco use- YOU CAN DO IT! Follow-up in 3 months for complete physical with fasting labs.  [place] Controlled Substance Database reviewed- no aberrancies noted She estimates to use Clonazepam 0.5mg  1/week Refill provided She has been using Citalopram 40mg  1/2- 1 tablet nightly She started half-ing rx about 6 months ago  Depression She reports mood is stable with Citalopram 40mg - she reports taking 1/2 to full tablet nightly and has been half-ing the dose for the last 6 months, denies withdrawal sx's Declined CBT referral   Elevated liver function tests Hepatic Function Panel 02/14/18    Component Value Date/Time   PROT 7.1 02/14/2018 0929   PROT 6.9 10/21/2017 0956   ALBUMIN 4.3 02/14/2018 0929   ALBUMIN 4.3 10/21/2017 0956   AST 30 02/14/2018 0929   ALT 32 02/14/2018 0929   ALKPHOS 57 02/14/2018 0929   BILITOT 0.3 02/14/2018 0929   BILITOT 0.2 10/21/2017 0956   BILIDIR 0.1 02/14/2018 0929   BILIDIR 0.09 10/21/2017 0956   Denies abdominal pain or change in bowel habits     FOLLOW-UP:  Return in about 3 months (around 08/16/2018) for CPE, Fasting Labs.

## 2018-05-16 ENCOUNTER — Ambulatory Visit (INDEPENDENT_AMBULATORY_CARE_PROVIDER_SITE_OTHER): Payer: BLUE CROSS/BLUE SHIELD | Admitting: Adult Health

## 2018-05-16 ENCOUNTER — Encounter: Payer: Self-pay | Admitting: Adult Health

## 2018-05-16 VITALS — BP 99/63 | HR 69 | Ht 66.75 in | Wt 148.6 lb

## 2018-05-16 DIAGNOSIS — F17200 Nicotine dependence, unspecified, uncomplicated: Secondary | ICD-10-CM | POA: Diagnosis not present

## 2018-05-16 DIAGNOSIS — F329 Major depressive disorder, single episode, unspecified: Secondary | ICD-10-CM | POA: Diagnosis not present

## 2018-05-16 DIAGNOSIS — Z0000 Encounter for general adult medical examination without abnormal findings: Secondary | ICD-10-CM

## 2018-05-16 DIAGNOSIS — R945 Abnormal results of liver function studies: Secondary | ICD-10-CM

## 2018-05-16 DIAGNOSIS — F419 Anxiety disorder, unspecified: Secondary | ICD-10-CM

## 2018-05-16 DIAGNOSIS — F32A Depression, unspecified: Secondary | ICD-10-CM

## 2018-05-16 DIAGNOSIS — R7989 Other specified abnormal findings of blood chemistry: Secondary | ICD-10-CM

## 2018-05-16 MED ORDER — CLONAZEPAM 0.5 MG PO TABS: 0.5000 mg | ORAL_TABLET | Freq: Every evening | ORAL | 0 refills | Status: DC | PRN

## 2018-05-16 NOTE — Patient Instructions (Signed)
Mediterranean Diet A Mediterranean diet refers to food and lifestyle choices that are based on the traditions of countries located on the [place]. This way of eating has been shown to help prevent certain conditions and improve outcomes for people who have chronic diseases, like kidney disease and heart disease. What are tips for following this plan? Lifestyle  Cook and eat meals together with your family, when possible.  Drink enough fluid to keep your urine clear or pale yellow.  Be physically active every day. This includes: ? Aerobic exercise like running or swimming. ? Leisure activities like gardening, walking, or housework.  Get 7-8 hours of sleep each night.  If recommended by your health care provider, drink red wine in moderation. This means 1 glass a day for nonpregnant women and 2 glasses a day for men. A glass of wine equals 5 oz (150 mL). Reading food labels  Check the serving size of packaged foods. For foods such as rice and pasta, the serving size refers to the amount of cooked product, not dry.  Check the total fat in packaged foods. Avoid foods that have saturated fat or trans fats.  Check the ingredients list for added sugars, such as corn syrup. Shopping  At the grocery store, buy most of your food from the areas near the walls of the store. This includes: ? Fresh fruits and vegetables (produce). ? Grains, beans, nuts, and seeds. Some of these may be available in unpackaged forms or large amounts (in bulk). ? Fresh seafood. ? Poultry and eggs. ? Low-fat dairy products.  Buy whole ingredients instead of prepackaged foods.  Buy fresh fruits and vegetables in-season from local farmers markets.  Buy frozen fruits and vegetables in resealable bags.  If you do not have access to quality fresh seafood, buy precooked frozen shrimp or canned fish, such as tuna, salmon, or sardines.  Buy small amounts of raw or cooked vegetables, salads, or olives from the  deli or salad bar at your store.  Stock your pantry so you always have certain foods on hand, such as olive oil, canned tuna, canned tomatoes, rice, pasta, and beans. Cooking  Cook foods with extra-virgin olive oil instead of using butter or other vegetable oils.  Have meat as a side dish, and have vegetables or grains as your main dish. This means having meat in small portions or adding small amounts of meat to foods like pasta or stew.  Use beans or vegetables instead of meat in common dishes like chili or lasagna.  Experiment with different cooking methods. Try roasting or broiling vegetables instead of steaming or sauteing them.  Add frozen vegetables to soups, stews, pasta, or rice.  Add nuts or seeds for added healthy fat at each meal. You can add these to yogurt, salads, or vegetable dishes.  Marinate fish or vegetables using olive oil, lemon juice, garlic, and fresh herbs. Meal planning  Plan to eat 1 vegetarian meal one day each week. Try to work up to 2 vegetarian meals, if possible.  Eat seafood 2 or more times a week.  Have healthy snacks readily available, such as: ? Vegetable sticks with hummus. ? Greek yogurt. ? Fruit and nut trail mix.  Eat balanced meals throughout the week. This includes: ? Fruit: 2-3 servings a day ? Vegetables: 4-5 servings a day ? Low-fat dairy: 2 servings a day ? Fish, poultry, or lean meat: 1 serving a day ? Beans and legumes: 2 or more servings a week ? Nuts   and seeds: 1-2 servings a day ? Whole grains: 6-8 servings a day ? Extra-virgin olive oil: 3-4 servings a day  Limit red meat and sweets to only a few servings a month What are my food choices?  Mediterranean diet ? Recommended ? Grains: Whole-grain pasta. Brown rice. Bulgar wheat. Polenta. Couscous. Whole-wheat bread. [place]. ? Vegetables: Artichokes. Beets. Broccoli. Cabbage. Carrots. Eggplant. Green beans. Chard. Kale. Spinach. Onions. Leeks. Peas. Squash.  Tomatoes. Peppers. Radishes. ? Fruits: Apples. Apricots. Avocado. Berries. Bananas. Cherries. Dates. Figs. Grapes. Lemons. Melon. Oranges. Peaches. Plums. Pomegranate. ? Meats and other protein foods: Beans. Almonds. Sunflower seeds. Pine nuts. Peanuts. [place]. Salmon. Scallops. Shrimp. [place]. Tilapia. Clams. Oysters. Eggs. ? Dairy: Low-fat milk. Cheese. Greek yogurt. ? Beverages: Water. Red wine. Herbal tea. ? Fats and oils: Extra virgin olive oil. Avocado oil. Grape seed oil. ? Sweets and desserts: [place] yogurt with honey. Baked apples. Poached pears. Trail mix. ? Seasoning and other foods: Basil. Cilantro. Coriander. Cumin. Mint. Parsley. Sage. Rosemary. Tarragon. Garlic. Oregano. Thyme. Pepper. Balsalmic vinegar. Tahini. Hummus. Tomato sauce. Olives. Mushrooms. ? Limit these ? Grains: Prepackaged pasta or rice dishes. Prepackaged cereal with added sugar. ? Vegetables: Deep fried potatoes (french fries). ? Fruits: Fruit canned in syrup. ? Meats and other protein foods: Beef. Pork. Lamb. Poultry with skin. Hot dogs. [place] Salines. ? Dairy: Ice cream. Sour cream. Whole milk. ? Beverages: Juice. Sugar-sweetened soft drinks. Beer. Liquor and spirits. ? Fats and oils: Butter. Canola oil. Vegetable oil. Beef fat (tallow). Lard. ? Sweets and desserts: Cookies. Cakes. Pies. Candy. ? Seasoning and other foods: Mayonnaise. Premade sauces and marinades. ? The items listed may not be a complete list. Talk with your dietitian about what dietary choices are right for you. Summary  The Mediterranean diet includes both food and lifestyle choices.  Eat a variety of fresh fruits and vegetables, beans, nuts, seeds, and whole grains.  Limit the amount of red meat and sweets that you eat.  Talk with your health care provider about whether it is safe for you to drink red wine in moderation. This means 1 glass a day for nonpregnant women and 2 glasses a day for men. A glass of wine equals 5 oz (150 mL). This information  is not intended to replace advice given to you by your health care provider. Make sure you discuss any questions you have with your health care provider. Document Released: 06/04/2016 Document Revised: 07/07/2016 Document Reviewed: 06/04/2016 Elsevier Interactive Patient Education  2018 [employer].   Generalized Anxiety Disorder, Adult Generalized anxiety disorder (GAD) is a mental health disorder. People with this condition constantly worry about everyday events. Unlike normal anxiety, worry related to GAD is not triggered by a specific event. These worries also do not fade or get better with time. GAD interferes with life functions, including relationships, work, and school. GAD can vary from mild to severe. People with severe GAD can have intense waves of anxiety with physical symptoms (panic attacks). What are the causes? The exact cause of GAD is not known. What increases the risk? This condition is more likely to develop in:  Women.  People who have a family history of anxiety disorders.  People who are very shy.  People who experience very stressful life events, such as the death of a loved one.  People who have a very stressful family environment.  What are the signs or symptoms? People with GAD often worry excessively about many things in their lives, such as their health  and family. They may also be overly concerned about:  Doing well at work.  Being on time.  Natural disasters.  Friendships.  Physical symptoms of GAD include:  Fatigue.  Muscle tension or having muscle twitches.  Trembling or feeling shaky.  Being easily startled.  Feeling like your heart is pounding or racing.  Feeling out of breath or like you cannot take a deep breath.  Having trouble falling asleep or staying asleep.  Sweating.  Nausea, diarrhea, or irritable bowel syndrome (IBS).  Headaches.  Trouble concentrating or remembering facts.  Restlessness.  Irritability.  How  is this diagnosed? Your health care provider can diagnose GAD based on your symptoms and medical history. You will also have a physical exam. The health care provider will ask specific questions about your symptoms, including how severe they are, when they started, and if they come and go. Your health care provider may ask you about your use of alcohol or drugs, including prescription medicines. Your health care provider may refer you to a mental health specialist for further evaluation. Your health care provider will do a thorough examination and may perform additional tests to rule out other possible causes of your symptoms. To be diagnosed with GAD, a person must have anxiety that:  Is out of his or her control.  Affects several different aspects of his or her life, such as work and relationships.  Causes distress that makes him or her unable to take part in normal activities.  Includes at least three physical symptoms of GAD, such as restlessness, fatigue, trouble concentrating, irritability, muscle tension, or sleep problems.  Before your health care provider can confirm a diagnosis of GAD, these symptoms must be present more days than they are not, and they must last for six months or longer. How is this treated? The following therapies are usually used to treat GAD:  Medicine. Antidepressant medicine is usually prescribed for long-term daily control. Antianxiety medicines may be added in severe cases, especially when panic attacks occur.  Talk therapy (psychotherapy). Certain types of talk therapy can be helpful in treating GAD by providing support, education, and guidance. Options include: ? Cognitive behavioral therapy (CBT). People learn coping skills and techniques to ease their anxiety. They learn to identify unrealistic or negative thoughts and behaviors and to replace them with positive ones. ? Acceptance and commitment therapy (ACT). This treatment teaches people how to be mindful  as a way to cope with unwanted thoughts and feelings. ? Biofeedback. This process trains you to manage your body's response (physiological response) through breathing techniques and relaxation methods. You will work with a therapist while machines are used to monitor your physical symptoms.  Stress management techniques. These include yoga, meditation, and exercise.  A mental health specialist can help determine which treatment is best for you. Some people see improvement with one type of therapy. However, other people require a combination of therapies. Follow these instructions at home:  Take over-the-counter and prescription medicines only as told by your health care provider.  Try to maintain a normal routine.  Try to anticipate stressful situations and allow extra time to manage them.  Practice any stress management or self-calming techniques as taught by your health care provider.  Do not punish yourself for setbacks or for not making progress.  Try to recognize your accomplishments, even if they are small.  Keep all follow-up visits as told by your health care provider. This is important. Contact a health care provider if:  Your  symptoms do not get better.  Your symptoms get worse.  You have signs of depression, such as: ? A persistently sad, cranky, or irritable mood. ? Loss of enjoyment in activities that used to bring you joy. ? Change in weight or eating. ? Changes in sleeping habits. ? Avoiding friends or family members. ? Loss of energy for normal tasks. ? Feelings of guilt or worthlessness. Get help right away if:  You have serious thoughts about hurting yourself or others. If you ever feel like you may hurt yourself or others, or have thoughts about taking your own life, get help right away. You can go to your nearest emergency department or call:  Your local emergency services (911 in the [place]  A suicide crisis helpline, such as the [place] at 442-634-4327. This is open 24 hours a day.  Summary  Generalized anxiety disorder (GAD) is a mental health disorder that involves worry that is not triggered by a specific event.  People with GAD often worry excessively about many things in their lives, such as their health and family.  GAD may cause physical symptoms such as restlessness, trouble concentrating, sleep problems, frequent sweating, nausea, diarrhea, headaches, and trembling or muscle twitching.  A mental health specialist can help determine which treatment is best for you. Some people see improvement with one type of therapy. However, other people require a combination of therapies. This information is not intended to replace advice given to you by your health care provider. Make sure you discuss any questions you have with your health care provider. Document Released: 02/06/2013 Document Revised: 09/01/2016 Document Reviewed: 09/01/2016 Elsevier Interactive Patient Education  [employer].  Please continue all medications as directed. Continue to stay well hydrated and follow Mediterranean diet. Continue to move as often as possible. Reduce-stop tobacco use- YOU CAN DO IT! Follow-up in 3 months for complete physical with fasting labs. NICE TO SEE YOU!

## 2018-05-16 NOTE — Assessment & Plan Note (Signed)
Hepatic Function Panel 02/14/18    Component Value Date/Time   PROT 7.1 02/14/2018 0929   PROT 6.9 10/21/2017 0956   ALBUMIN 4.3 02/14/2018 0929   ALBUMIN 4.3 10/21/2017 0956   AST 30 02/14/2018 0929   ALT 32 02/14/2018 0929   ALKPHOS 57 02/14/2018 0929   BILITOT 0.3 02/14/2018 0929   BILITOT 0.2 10/21/2017 0956   BILIDIR 0.1 02/14/2018 0929   BILIDIR 0.09 10/21/2017 0956   Denies abdominal pain or change in bowel habits

## 2018-05-16 NOTE — Assessment & Plan Note (Addendum)
She reports mood is stable with Citalopram 40mg - she reports taking 1/2 to full tablet nightly and has been half-ing the dose for the last 6 months, denies withdrawal sx's Declined CBT referral

## 2018-05-16 NOTE — Assessment & Plan Note (Signed)
Please continue all medications as directed. Continue to stay well hydrated and follow Mediterranean diet. Continue to move as often as possible. Reduce-stop tobacco use- YOU CAN DO IT! Follow-up in 3 months for complete physical with fasting labs.

## 2018-05-16 NOTE — Assessment & Plan Note (Signed)
[place] Controlled Substance Database reviewed- no aberrancies noted She estimates to use Clonazepam 0.5mg  1/week Refill provided She has been using Citalopram 40mg  1/2- 1 tablet nightly She started half-ing rx about 6 months ago

## 2018-07-28 ENCOUNTER — Ambulatory Visit (INDEPENDENT_AMBULATORY_CARE_PROVIDER_SITE_OTHER): Payer: BLUE CROSS/BLUE SHIELD

## 2018-07-28 VITALS — BP 105/68 | HR 73 | Temp 98.2°F

## 2018-07-28 DIAGNOSIS — Z23 Encounter for immunization: Secondary | ICD-10-CM | POA: Diagnosis not present

## 2018-08-14 NOTE — Progress Notes (Signed)
Subjective:    Patient ID: Felicia Good, female    DOB: 22-Jun-1961, 57 y.o.   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 while her daughter/son-in-law work She estimates to drink >80 oz water/day and reports "slipping on the diet the last few months" She remains active with house/yard work, yoga/mediatation She continues to use tobacco, pack will last 3 /day Declined smoking cessation today  Healthcare Maintenance: PAP-UTD Mammogram-UTD Colonoscopy-UTD Immunizations-Decline Zoster  Patient Care Team    Relationship Specialty Notifications Start End  Haik Mahoney, Berna Spare, NP PCP - General Family Medicine  06/14/17   Sanjuana Kava, MD Referring Physician Obstetrics and Gynecology  06/14/17     Patient Active Problem List   Diagnosis Date Noted  . Oral pharyngeal candidiasis 08/25/2017  . 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 Thrush, oral 07/23/2017  . Elevated liver function tests 07/15/2017  . Hyperlipidemia 07/15/2017  . Anxiety 07/15/2017  . Chronic sinusitis 06/14/2017  . Healthcare maintenance 06/14/2017  . PANIC DISORDER,NO AGORAPHOBIA 10/13/2010  . TOBACCO ABUSE 08/13/2008  . BACK PAIN 01/19/2008  . URTICARIA NOS 08/15/2007  . Depression 06/15/2007     Past Medical History:  Diagnosis Date  . Anxiety   . Depression   . IBS (irritable bowel syndrome)   . Migraine headache   . Mitral valve prolapse   . Panic attacks   . Rectal polyp   . RLS (restless legs syndrome)      Past Surgical History:  Procedure Laterality Date  . rectal polyp removed  1987   Local anesthesia used  . RECTAL POLYPECTOMY       Family History  Problem Relation Age of Onset  . Other Father        esophageal problems  . Heart  failure Father   . Cirrhosis Father        Non alcoholic   . Stroke Father        Died in 74   . Hypertension Father   . Hypertension Mother   . Stroke Mother   . Dementia Mother   . Breast cancer Maternal Aunt   . Colon cancer Neg Hx      Social History   Substance and Sexual Activity  Drug Use No     Social History   Substance and Sexual Activity  Alcohol Use Yes  . Alcohol/week: 0.0 standard drinks   Comment: occ     Social History   Tobacco Use  Smoking Status Current Every Day Smoker  . Packs/day: 0.25  . Years: 40.00  . Pack years: 10.00  . Types: Cigarettes  Smokeless Tobacco Never Used  Tobacco Comment   has cut back     Outpatient Encounter Medications as of 08/16/2018  Medication Sig  . Cholecalciferol (VITAMIN D3) 2000 units TABS Take 1 tablet by mouth daily.  . citalopram (CELEXA) 40 MG tablet TAKE 1 TABLET BY MOUTH DAILY.  . clonazePAM (KLONOPIN) 0.5 MG tablet Take 1 tablet (0.5 mg total) by mouth at bedtime as needed for anxiety.  . cyclobenzaprine (FLEXERIL) 10 MG tablet Take 10 mg by mouth 3 (three) times daily as needed for muscle spasms.  . fluticasone (FLONASE) 50 MCG/ACT nasal spray Place 1 spray into both nostrils 2 (two) times  daily as needed for allergies or rhinitis.  . Prenatal Vit-Fe Fumarate-FA (PRENATAL VITAMIN PO) Take 1 tablet by mouth daily.  . TRIAMCINOLONE ACETONIDE EX 1 application Topical 2 times daily   No facility-administered encounter medications on file as of 08/16/2018.     Allergies: Patient has no known allergies.  Body mass index is 23.92 kg/m.  Blood pressure (!) 97/59, pulse 63, height 5' 6.75" (1.695 m), weight 151 lb 9.6 oz (68.8 kg), last menstrual period 11/26/2013, SpO2 100 %.  Her BP normally runs on lower side  Review of Systems  Constitutional: Positive for fatigue. Negative for activity change, appetite change, chills, diaphoresis, fever and unexpected weight change.  HENT: Negative for  congestion.   Eyes: Negative for visual disturbance.  Respiratory: Negative for cough, chest tightness, shortness of breath, wheezing and stridor.   Cardiovascular: Negative for chest pain, palpitations and leg swelling.  Gastrointestinal: Negative for abdominal distention, abdominal pain, blood in stool, constipation, diarrhea, nausea and vomiting.  Genitourinary: Negative for difficulty urinating and flank pain.  Musculoskeletal: Positive for myalgias, neck pain and neck stiffness. Negative for arthralgias, back pain, gait problem and joint swelling.  Skin: Negative for color change, pallor, rash and wound.  Neurological: Negative for dizziness and headaches.  Hematological: Does not bruise/bleed easily.  Psychiatric/Behavioral: Positive for dysphoric mood. Negative for behavioral problems, confusion, decreased concentration, hallucinations, self-injury, sleep disturbance and suicidal ideas. The patient is nervous/anxious. The patient is not hyperactive.        Objective:   Physical Exam  Constitutional: She is oriented to person, place, and time. She appears well-developed and well-nourished. No distress.  HENT:  Head: Normocephalic and atraumatic.  Right Ear: External ear normal. Tympanic membrane is not erythematous and not bulging. No decreased hearing is noted.  Left Ear: External ear normal. Tympanic membrane is not erythematous and not bulging. No decreased hearing is noted.  Nose: Nose normal. No mucosal edema or rhinorrhea. Right sinus exhibits no maxillary sinus tenderness and no frontal sinus tenderness. Left sinus exhibits no maxillary sinus tenderness and no frontal sinus tenderness.  Mouth/Throat: Oropharynx is clear and moist and mucous membranes are normal. Abnormal dentition. No oropharyngeal exudate, posterior oropharyngeal edema, posterior oropharyngeal erythema or tonsillar abscesses. Tonsils are 0 on the right. Tonsils are 0 on the left. No tonsillar exudate.  Eyes:  Pupils are equal, round, and reactive to light. Conjunctivae and EOM are normal.  Neck: Normal range of motion. Neck supple.  Cardiovascular: Normal rate, regular rhythm, normal heart sounds and intact distal pulses.  No murmur heard. Pulmonary/Chest: Effort normal and breath sounds normal. No stridor. No respiratory distress. She has no decreased breath sounds. She has no wheezes. She has no rhonchi. She has no rales. She exhibits no tenderness. Right breast exhibits no inverted nipple, no mass, no nipple discharge, no skin change and no tenderness. Left breast exhibits no inverted nipple, no mass, no nipple discharge, no skin change and no tenderness.  Abdominal: Soft. Bowel sounds are normal. She exhibits no distension and no mass. There is no tenderness. There is no rebound and no guarding. No hernia.  Musculoskeletal: Normal range of motion. She exhibits tenderness. She exhibits no edema.       Cervical back: She exhibits tenderness.  Lymphadenopathy:    She has no cervical adenopathy.  Neurological: She is alert and oriented to person, place, and time. Coordination normal.  Skin: Skin is warm and dry. Capillary refill takes less than 2 seconds. No rash noted. She  is not diaphoretic. No erythema. No pallor.  Psychiatric: She has a normal mood and affect. Her behavior is normal. Judgment and thought content normal.  Nursing note and vitals reviewed.     Assessment & Plan:   1. Healthcare maintenance   2. Hyperlipidemia, unspecified hyperlipidemia type   3. Elevated liver function tests     Healthcare maintenance Continue to drink plenty of water and follow Mediterranean diet. Continue medications as directed, please call pharmacy when you need refills. We will call you when lab results are available. Continue yoga/meditation. Continue to reduce-stop tobacco use-you can do it! Follow-up in 6 months.  Hyperlipidemia The 10-year ASCVD risk score Mikey Bussing DC Jr., et al., 2013) is: 4.7%    Values used to calculate the score:     Age: 41 years     Sex: Female     Is Non-Hispanic African American: No     Diabetic: No     Tobacco smoker: Yes     Systolic Blood Pressure: 97 mmHg     Is BP treated: No     HDL Cholesterol: 52 mg/dL     Total Cholesterol: 266 mg/dL  LDL-180 Not currently on statin Hx of elevated LFTs Fasting labs obtained today    FOLLOW-UP:  Return in about 6 months (around 02/15/2019) for Regular Follow Up.

## 2018-08-16 ENCOUNTER — Encounter: Payer: Self-pay | Admitting: Adult Health

## 2018-08-16 ENCOUNTER — Ambulatory Visit (INDEPENDENT_AMBULATORY_CARE_PROVIDER_SITE_OTHER): Payer: BLUE CROSS/BLUE SHIELD | Admitting: Adult Health

## 2018-08-16 VITALS — BP 97/59 | HR 63 | Ht 66.75 in | Wt 151.6 lb

## 2018-08-16 DIAGNOSIS — E785 Hyperlipidemia, unspecified: Secondary | ICD-10-CM | POA: Diagnosis not present

## 2018-08-16 DIAGNOSIS — Z0000 Encounter for general adult medical examination without abnormal findings: Secondary | ICD-10-CM | POA: Diagnosis not present

## 2018-08-16 DIAGNOSIS — R945 Abnormal results of liver function studies: Secondary | ICD-10-CM | POA: Diagnosis not present

## 2018-08-16 DIAGNOSIS — R7989 Other specified abnormal findings of blood chemistry: Secondary | ICD-10-CM

## 2018-08-16 NOTE — Assessment & Plan Note (Signed)
Continue to drink plenty of water and follow Mediterranean diet. Continue medications as directed, please call pharmacy when you need refills. We will call you when lab results are available. Continue yoga/meditation. Continue to reduce-stop tobacco use-you can do it! Follow-up in 6 months.

## 2018-08-16 NOTE — Patient Instructions (Signed)
Preventive Care for Adults, Female  A healthy lifestyle and preventive care can promote health and wellness. Preventive health guidelines for women include the following key practices.   A routine yearly physical is a good way to check with your health care provider about your health and preventive screening. It is a chance to share any concerns and updates on your health and to receive a thorough exam.   Visit your dentist for a routine exam and preventive care every 6 months. Brush your teeth twice a day and floss once a day. Good oral hygiene prevents tooth decay and gum disease.   The frequency of eye exams is based on your age, health, family medical history, use of contact lenses, and other factors. Follow your health care provider's recommendations for frequency of eye exams.   Eat a healthy diet. Foods like vegetables, fruits, whole grains, low-fat dairy products, and lean protein foods contain the nutrients you need without too many calories. Decrease your intake of foods high in solid fats, added sugars, and salt. Eat the right amount of calories for you.Get information about a proper diet from your health care provider, if necessary.   Regular physical exercise is one of the most important things you can do for your health. Most adults should get at least 150 minutes of moderate-intensity exercise (any activity that increases your heart rate and causes you to sweat) each week. In addition, most adults need muscle-strengthening exercises on 2 or more days a week.   Maintain a healthy weight. The body mass index (BMI) is a screening tool to identify possible weight problems. It provides an estimate of body fat based on height and weight. Your health care provider can find your BMI, and can help you achieve or maintain a healthy weight.For adults 20 years and older:   - A BMI below 18.5 is considered underweight.   - A BMI of 18.5 to 24.9 is normal.   - A BMI of 25 to 29.9 is  considered overweight.   - A BMI of 30 and above is considered obese.   Maintain normal blood lipids and cholesterol levels by exercising and minimizing your intake of trans and saturated fats.  Eat a balanced diet with plenty of fruit and vegetables. Blood tests for lipids and cholesterol should begin at age 20 and be repeated every 5 years minimum.  If your lipid or cholesterol levels are high, you are over 40, or you are at high risk for heart disease, you may need your cholesterol levels checked more frequently.Ongoing high lipid and cholesterol levels should be treated with medicines if diet and exercise are not working.   If you smoke, find out from your health care provider how to quit. If you do not use tobacco, do not start.   Lung cancer screening is recommended for adults aged 55-80 years who are at high risk for developing lung cancer because of a history of smoking. A yearly low-dose CT scan of the lungs is recommended for people who have at least a 30-pack-year history of smoking and are a current smoker or have quit within the past 15 years. A pack year of smoking is smoking an average of 1 pack of cigarettes a day for 1 year (for example: 1 pack a day for 30 years or 2 packs a day for 15 years). Yearly screening should continue until the smoker has stopped smoking for at least 15 years. Yearly screening should be stopped for people who develop a   health problem that would prevent them from having lung cancer treatment.   If you are pregnant, do not drink alcohol. If you are breastfeeding, be very cautious about drinking alcohol. If you are not pregnant and choose to drink alcohol, do not have more than 1 drink per day. One drink is considered to be 12 ounces (355 mL) of beer, 5 ounces (148 mL) of wine, or 1.5 ounces (44 mL) of liquor.   Avoid use of street drugs. Do not share needles with anyone. Ask for help if you need support or instructions about stopping the use of  drugs.   High blood pressure causes heart disease and increases the risk of stroke. Your blood pressure should be checked at least yearly.  Ongoing high blood pressure should be treated with medicines if weight loss and exercise do not work.   If you are 69-55 years old, ask your health care provider if you should take aspirin to prevent strokes.   Diabetes screening involves taking a blood sample to check your fasting blood sugar level. This should be done once every 3 years, after age 38, if you are within normal weight and without risk factors for diabetes. Testing should be considered at a younger age or be carried out more frequently if you are overweight and have at least 1 risk factor for diabetes.   Breast cancer screening is essential preventive care for women. You should practice "breast self-awareness."  This means understanding the normal appearance and feel of your breasts and may include breast self-examination.  Any changes detected, no matter how small, should be reported to a health care provider.  Women in their 80s and 30s should have a clinical breast exam (CBE) by a health care provider as part of a regular health exam every 1 to 3 years.  After age 66, women should have a CBE every year.  Starting at age 1, women should consider having a mammogram (breast X-ray test) every year.  Women who have a family history of breast cancer should talk to their health care provider about genetic screening.  Women at a high risk of breast cancer should talk to their health care providers about having an MRI and a mammogram every year.   -Breast cancer gene (BRCA)-related cancer risk assessment is recommended for women who have family members with BRCA-related cancers. BRCA-related cancers include breast, ovarian, tubal, and peritoneal cancers. Having family members with these cancers may be associated with an increased risk for harmful changes (mutations) in the breast cancer genes BRCA1 and  BRCA2. Results of the assessment will determine the need for genetic counseling and BRCA1 and BRCA2 testing.   The Pap test is a screening test for cervical cancer. A Pap test can show cell changes on the cervix that might become cervical cancer if left untreated. A Pap test is a procedure in which cells are obtained and examined from the lower end of the uterus (cervix).   - Women should have a Pap test starting at age 57.   - Between ages 90 and 70, Pap tests should be repeated every 2 years.   - Beginning at age 63, you should have a Pap test every 3 years as long as the past 3 Pap tests have been normal.   - Some women have medical problems that increase the chance of getting cervical cancer. Talk to your health care provider about these problems. It is especially important to talk to your health care provider if a  new problem develops soon after your last Pap test. In these cases, your health care provider may recommend more frequent screening and Pap tests.   - The above recommendations are the same for women who have or have not gotten the vaccine for human papillomavirus (HPV).   - If you had a hysterectomy for a problem that was not cancer or a condition that could lead to cancer, then you no longer need Pap tests. Even if you no longer need a Pap test, a regular exam is a good idea to make sure no other problems are starting.   - If you are between ages 36 and 66 years, and you have had normal Pap tests going back 10 years, you no longer need Pap tests. Even if you no longer need a Pap test, a regular exam is a good idea to make sure no other problems are starting.   - If you have had past treatment for cervical cancer or a condition that could lead to cancer, you need Pap tests and screening for cancer for at least 20 years after your treatment.   - If Pap tests have been discontinued, risk factors (such as a new sexual partner) need to be reassessed to determine if screening should  be resumed.   - The HPV test is an additional test that may be used for cervical cancer screening. The HPV test looks for the virus that can cause the cell changes on the cervix. The cells collected during the Pap test can be tested for HPV. The HPV test could be used to screen women aged 70 years and older, and should be used in women of any age who have unclear Pap test results. After the age of 67, women should have HPV testing at the same frequency as a Pap test.   Colorectal cancer can be detected and often prevented. Most routine colorectal cancer screening begins at the age of 57 years and continues through age 26 years. However, your health care provider may recommend screening at an earlier age if you have risk factors for colon cancer. On a yearly basis, your health care provider may provide home test kits to check for hidden blood in the stool.  Use of a small camera at the end of a tube, to directly examine the colon (sigmoidoscopy or colonoscopy), can detect the earliest forms of colorectal cancer. Talk to your health care provider about this at age 23, when routine screening begins. Direct exam of the colon should be repeated every 5 -10 years through age 49 years, unless early forms of pre-cancerous polyps or small growths are found.   People who are at an increased risk for hepatitis B should be screened for this virus. You are considered at high risk for hepatitis B if:  -You were born in a country where hepatitis B occurs often. Talk with your health care provider about which countries are considered high risk.  - Your parents were born in a high-risk country and you have not received a shot to protect against hepatitis B (hepatitis B vaccine).  - You have HIV or AIDS.  - You use needles to inject street drugs.  - You live with, or have sex with, someone who has Hepatitis B.  - You get hemodialysis treatment.  - You take certain medicines for conditions like cancer, organ  transplantation, and autoimmune conditions.   Hepatitis C blood testing is recommended for all people born from [birth year] through 1965 and any individual  with known risks for hepatitis C.   Practice safe sex. Use condoms and avoid high-risk sexual practices to reduce the spread of sexually transmitted infections (STIs). STIs include gonorrhea, chlamydia, syphilis, trichomonas, herpes, HPV, and human immunodeficiency virus (HIV). Herpes, HIV, and HPV are viral illnesses that have no cure. They can result in disability, cancer, and death. Sexually active women aged 25 years and younger should be checked for chlamydia. Older women with new or multiple partners should also be tested for chlamydia. Testing for other STIs is recommended if you are sexually active and at increased risk.   Osteoporosis is a disease in which the bones lose minerals and strength with aging. This can result in serious bone fractures or breaks. The risk of osteoporosis can be identified using a bone density scan. Women ages 65 years and over and women at risk for fractures or osteoporosis should discuss screening with their health care providers. Ask your health care provider whether you should take a calcium supplement or vitamin D to There are also several preventive steps women can take to avoid osteoporosis and resulting fractures or to keep osteoporosis from worsening. -->Recommendations include:  Eat a balanced diet high in fruits, vegetables, calcium, and vitamins.  Get enough calcium. The recommended total intake of is 1,200 mg daily; for best absorption, if taking supplements, divide doses into 250-500 mg doses throughout the day. Of the two types of calcium, calcium carbonate is best absorbed when taken with food but calcium citrate can be taken on an empty stomach.  Get enough vitamin D. NAMS and the National Osteoporosis Foundation recommend at least 1,000 IU per day for women age 50 and over who are at risk of vitamin D  deficiency. Vitamin D deficiency can be caused by inadequate sun exposure (for example, those who live in northern latitudes).  Avoid alcohol and smoking. Heavy alcohol intake (more than 7 drinks per week) increases the risk of falls and hip fracture and women smokers tend to lose bone more rapidly and have lower bone mass than nonsmokers. Stopping smoking is one of the most important changes women can make to improve their health and decrease risk for disease.  Be physically active every day. Weight-bearing exercise (for example, fast walking, hiking, jogging, and weight training) may strengthen bones or slow the rate of bone loss that comes with aging. Balancing and muscle-strengthening exercises can reduce the risk of falling and fracture.  Consider therapeutic medications. Currently, several types of effective drugs are available. Healthcare providers can recommend the type most appropriate for each woman.  Eliminate environmental factors that may contribute to accidents. Falls cause nearly 90% of all osteoporotic fractures, so reducing this risk is an important bone-health strategy. Measures include ample lighting, removing obstructions to walking, using nonskid rugs on floors, and placing mats and/or grab bars in showers.  Be aware of medication side effects. Some common medicines make bones weaker. These include a type of steroid drug called glucocorticoids used for arthritis and asthma, some antiseizure drugs, certain sleeping pills, treatments for endometriosis, and some cancer drugs. An overactive thyroid gland or using too much thyroid hormone for an underactive thyroid can also be a problem. If you are taking these medicines, talk to your doctor about what you can do to help protect your bones.reduce the rate of osteoporosis.    Menopause can be associated with physical symptoms and risks. Hormone replacement therapy is available to decrease symptoms and risks. You should talk to your  health care provider   about whether hormone replacement therapy is right for you.   Use sunscreen. Apply sunscreen liberally and repeatedly throughout the day. You should seek shade when your shadow is shorter than you. Protect yourself by wearing long sleeves, pants, a wide-brimmed hat, and sunglasses year round, whenever you are outdoors.   Once a month, do a whole body skin exam, using a mirror to look at the skin on your back. Tell your health care provider of new moles, moles that have irregular borders, moles that are larger than a pencil eraser, or moles that have changed in shape or color.   -Stay current with required vaccines (immunizations).   Influenza vaccine. All adults should be immunized every year.  Tetanus, diphtheria, and acellular pertussis (Td, Tdap) vaccine. Pregnant women should receive 1 dose of Tdap vaccine during each pregnancy. The dose should be obtained regardless of the length of time since the last dose. Immunization is preferred during the 27th 36th week of gestation. An adult who has not previously received Tdap or who does not know her vaccine status should receive 1 dose of Tdap. This initial dose should be followed by tetanus and diphtheria toxoids (Td) booster doses every 10 years. Adults with an unknown or incomplete history of completing a 3-dose immunization series with Td-containing vaccines should begin or complete a primary immunization series including a Tdap dose. Adults should receive a Td booster every 10 years.  Varicella vaccine. An adult without evidence of immunity to varicella should receive 2 doses or a second dose if she has previously received 1 dose. Pregnant females who do not have evidence of immunity should receive the first dose after pregnancy. This first dose should be obtained before leaving the health care facility. The second dose should be obtained 4 8 weeks after the first dose.  Human papillomavirus (HPV) vaccine. Females aged 13 26  years who have not received the vaccine previously should obtain the 3-dose series. The vaccine is not recommended for use in pregnant females. However, pregnancy testing is not needed before receiving a dose. If a female is found to be pregnant after receiving a dose, no treatment is needed. In that case, the remaining doses should be delayed until after the pregnancy. Immunization is recommended for any person with an immunocompromised condition through the age of 26 years if she did not get any or all doses earlier. During the 3-dose series, the second dose should be obtained 4 8 weeks after the first dose. The third dose should be obtained 24 weeks after the first dose and 16 weeks after the second dose.  Zoster vaccine. One dose is recommended for adults aged 60 years or older unless certain conditions are present.  Measles, mumps, and rubella (MMR) vaccine. Adults born before 1957 generally are considered immune to measles and mumps. Adults born in 1957 or later should have 1 or more doses of MMR vaccine unless there is a contraindication to the vaccine or there is laboratory evidence of immunity to each of the three diseases. A routine second dose of MMR vaccine should be obtained at least 28 days after the first dose for students attending postsecondary schools, health care workers, or international travelers. People who received inactivated measles vaccine or an unknown type of measles vaccine during 1963 1967 should receive 2 doses of MMR vaccine. People who received inactivated mumps vaccine or an unknown type of mumps vaccine before 1979 and are at high risk for mumps infection should consider immunization with 2 doses of   MMR vaccine. For females of childbearing age, rubella immunity should be determined. If there is no evidence of immunity, females who are not pregnant should be vaccinated. If there is no evidence of immunity, females who are pregnant should delay immunization until after pregnancy.  Unvaccinated health care workers born before [birth year] who lack laboratory evidence of measles, mumps, or rubella immunity or laboratory confirmation of disease should consider measles and mumps immunization with 2 doses of MMR vaccine or rubella immunization with 1 dose of MMR vaccine.  Pneumococcal 13-valent conjugate (PCV13) vaccine. When indicated, a person who is uncertain of her immunization history and has no record of immunization should receive the PCV13 vaccine. An adult aged 54 years or older who has certain medical conditions and has not been previously immunized should receive 1 dose of PCV13 vaccine. This PCV13 should be followed with a dose of pneumococcal polysaccharide (PPSV23) vaccine. The PPSV23 vaccine dose should be obtained at least 8 weeks after the dose of PCV13 vaccine. An adult aged 58 years or older who has certain medical conditions and previously received 1 or more doses of PPSV23 vaccine should receive 1 dose of PCV13. The PCV13 vaccine dose should be obtained 1 or more years after the last PPSV23 vaccine dose.  Pneumococcal polysaccharide (PPSV23) vaccine. When PCV13 is also indicated, PCV13 should be obtained first. All adults aged 58 years and older should be immunized. An adult younger than age 65 years who has certain medical conditions should be immunized. Any person who resides in a nursing home or long-term care facility should be immunized. An adult smoker should be immunized. People with an immunocompromised condition and certain other conditions should receive both PCV13 and PPSV23 vaccines. People with human immunodeficiency virus (HIV) infection should be immunized as soon as possible after diagnosis. Immunization during chemotherapy or radiation therapy should be avoided. Routine use of PPSV23 vaccine is not recommended for American Indians, [place] Natives, or people younger than 65 years unless there are medical conditions that require PPSV23 vaccine. When indicated,  people who have unknown immunization and have no record of immunization should receive PPSV23 vaccine. One-time revaccination 5 years after the first dose of PPSV23 is recommended for people aged 70 64 years who have chronic kidney failure, nephrotic syndrome, asplenia, or immunocompromised conditions. People who received 1 2 doses of PPSV23 before age 32 years should receive another dose of PPSV23 vaccine at age 96 years or later if at least 5 years have passed since the previous dose. Doses of PPSV23 are not needed for people immunized with PPSV23 at or after age 55 years.  Meningococcal vaccine. Adults with asplenia or persistent complement component deficiencies should receive 2 doses of quadrivalent meningococcal conjugate (MenACWY-D) vaccine. The doses should be obtained at least 2 months apart. Microbiologists working with certain meningococcal bacteria, [employer] recruits, people at risk during an outbreak, and people who travel to or live in countries with a high rate of meningitis should be immunized. A first-year college student up through age 58 years who is living in a residence hall should receive a dose if she did not receive a dose on or after her 16th birthday. Adults who have certain high-risk conditions should receive one or more doses of vaccine.  Hepatitis A vaccine. Adults who wish to be protected from this disease, have certain high-risk conditions, work with hepatitis A-infected animals, work in hepatitis A research labs, or travel to or work in countries with a high rate of hepatitis A should be  immunized. Adults who were previously unvaccinated and who anticipate close contact with an international adoptee during the first 60 days after arrival in the [place] from a country with a high rate of hepatitis A should be immunized.  Hepatitis B vaccine.  Adults who wish to be protected from this disease, have certain high-risk conditions, may be exposed to blood or other infectious  body fluids, are household contacts or sex partners of hepatitis B positive people, are clients or workers in certain care facilities, or travel to or work in countries with a high rate of hepatitis B should be immunized.  Haemophilus influenzae type b (Hib) vaccine. A previously unvaccinated person with asplenia or sickle cell disease or having a scheduled splenectomy should receive 1 dose of Hib vaccine. Regardless of previous immunization, a recipient of a hematopoietic stem cell transplant should receive a 3-dose series 6 12 months after her successful transplant. Hib vaccine is not recommended for adults with HIV infection.  Preventive Services / Frequency Ages 6 to 39years  Blood pressure check.** / Every 1 to 2 years.  Lipid and cholesterol check.** / Every 5 years beginning at age 39.  Clinical breast exam.** / Every 3 years for women in their 61s and 62s.  BRCA-related cancer risk assessment.** / For women who have family members with a BRCA-related cancer (breast, ovarian, tubal, or peritoneal cancers).  Pap test.** / Every 2 years from ages 47 through 85. Every 3 years starting at age 34 through age 12 or 74 with a history of 3 consecutive normal Pap tests.  HPV screening.** / Every 3 years from ages 46 through ages 43 to 54 with a history of 3 consecutive normal Pap tests.  Hepatitis C blood test.** / For any individual with known risks for hepatitis C.  Skin self-exam. / Monthly.  Influenza vaccine. / Every year.  Tetanus, diphtheria, and acellular pertussis (Tdap, Td) vaccine.** / Consult your health care provider. Pregnant women should receive 1 dose of Tdap vaccine during each pregnancy. 1 dose of Td every 10 years.  Varicella vaccine.** / Consult your health care provider. Pregnant females who do not have evidence of immunity should receive the first dose after pregnancy.  HPV vaccine. / 3 doses over 6 months, if 64 and younger. The vaccine is not recommended for use in  pregnant females. However, pregnancy testing is not needed before receiving a dose.  Measles, mumps, rubella (MMR) vaccine.** / You need at least 1 dose of MMR if you were born in 1957 or later. You may also need a 2nd dose. For females of childbearing age, rubella immunity should be determined. If there is no evidence of immunity, females who are not pregnant should be vaccinated. If there is no evidence of immunity, females who are pregnant should delay immunization until after pregnancy.  Pneumococcal 13-valent conjugate (PCV13) vaccine.** / Consult your health care provider.  Pneumococcal polysaccharide (PPSV23) vaccine.** / 1 to 2 doses if you smoke cigarettes or if you have certain conditions.  Meningococcal vaccine.** / 1 dose if you are age 71 to 36 years and a Market researcher living in a residence hall, or have one of several medical conditions, you need to get vaccinated against meningococcal disease. You may also need additional booster doses.  Hepatitis A vaccine.** / Consult your health care provider.  Hepatitis B vaccine.** / Consult your health care provider.  Haemophilus influenzae type b (Hib) vaccine.** / Consult your health care provider.  Ages 55 to 64years  Blood pressure check.** / Every 1 to 2 years.  Lipid and cholesterol check.** / Every 5 years beginning at age 20 years.  Lung cancer screening. / Every year if you are aged 55 80 years and have a 30-pack-year history of smoking and currently smoke or have quit within the past 15 years. Yearly screening is stopped once you have quit smoking for at least 15 years or develop a health problem that would prevent you from having lung cancer treatment.  Clinical breast exam.** / Every year after age 40 years.  BRCA-related cancer risk assessment.** / For women who have family members with a BRCA-related cancer (breast, ovarian, tubal, or peritoneal cancers).  Mammogram.** / Every year beginning at age 40  years and continuing for as long as you are in good health. Consult with your health care provider.  Pap test.** / Every 3 years starting at age 30 years through age 65 or 70 years with a history of 3 consecutive normal Pap tests.  HPV screening.** / Every 3 years from ages 30 years through ages 65 to 70 years with a history of 3 consecutive normal Pap tests.  Fecal occult blood test (FOBT) of stool. / Every year beginning at age 50 years and continuing until age 75 years. You may not need to do this test if you get a colonoscopy every 10 years.  Flexible sigmoidoscopy or colonoscopy.** / Every 5 years for a flexible sigmoidoscopy or every 10 years for a colonoscopy beginning at age 50 years and continuing until age 75 years.  Hepatitis C blood test.** / For all people born from 1945 through 1965 and any individual with known risks for hepatitis C.  Skin self-exam. / Monthly.  Influenza vaccine. / Every year.  Tetanus, diphtheria, and acellular pertussis (Tdap/Td) vaccine.** / Consult your health care provider. Pregnant women should receive 1 dose of Tdap vaccine during each pregnancy. 1 dose of Td every 10 years.  Varicella vaccine.** / Consult your health care provider. Pregnant females who do not have evidence of immunity should receive the first dose after pregnancy.  Zoster vaccine.** / 1 dose for adults aged 60 years or older.  Measles, mumps, rubella (MMR) vaccine.** / You need at least 1 dose of MMR if you were born in 1957 or later. You may also need a 2nd dose. For females of childbearing age, rubella immunity should be determined. If there is no evidence of immunity, females who are not pregnant should be vaccinated. If there is no evidence of immunity, females who are pregnant should delay immunization until after pregnancy.  Pneumococcal 13-valent conjugate (PCV13) vaccine.** / Consult your health care provider.  Pneumococcal polysaccharide (PPSV23) vaccine.** / 1 to 2 doses if  you smoke cigarettes or if you have certain conditions.  Meningococcal vaccine.** / Consult your health care provider.  Hepatitis A vaccine.** / Consult your health care provider.  Hepatitis B vaccine.** / Consult your health care provider.  Haemophilus influenzae type b (Hib) vaccine.** / Consult your health care provider.  Ages 65 years and over  Blood pressure check.** / Every 1 to 2 years.  Lipid and cholesterol check.** / Every 5 years beginning at age 20 years.  Lung cancer screening. / Every year if you are aged 55 80 years and have a 30-pack-year history of smoking and currently smoke or have quit within the past 15 years. Yearly screening is stopped once you have quit smoking for at least 15 years or develop a health problem that   would prevent you from having lung cancer treatment.  Clinical breast exam.** / Every year after age 103 years.  BRCA-related cancer risk assessment.** / For women who have family members with a BRCA-related cancer (breast, ovarian, tubal, or peritoneal cancers).  Mammogram.** / Every year beginning at age 36 years and continuing for as long as you are in good health. Consult with your health care provider.  Pap test.** / Every 3 years starting at age 5 years through age 85 or 10 years with 3 consecutive normal Pap tests. Testing can be stopped between 65 and 70 years with 3 consecutive normal Pap tests and no abnormal Pap or HPV tests in the past 10 years.  HPV screening.** / Every 3 years from ages 93 years through ages 70 or 45 years with a history of 3 consecutive normal Pap tests. Testing can be stopped between 65 and 70 years with 3 consecutive normal Pap tests and no abnormal Pap or HPV tests in the past 10 years.  Fecal occult blood test (FOBT) of stool. / Every year beginning at age 8 years and continuing until age 45 years. You may not need to do this test if you get a colonoscopy every 10 years.  Flexible sigmoidoscopy or colonoscopy.** /  Every 5 years for a flexible sigmoidoscopy or every 10 years for a colonoscopy beginning at age 69 years and continuing until age 68 years.  Hepatitis C blood test.** / For all people born from [birth year] through 1966 and any individual with known risks for hepatitis C.  Osteoporosis screening.** / A one-time screening for women ages 7 years and over and women at risk for fractures or osteoporosis.  Skin self-exam. / Monthly.  Influenza vaccine. / Every year.  Tetanus, diphtheria, and acellular pertussis (Tdap/Td) vaccine.** / 1 dose of Td every 10 years.  Varicella vaccine.** / Consult your health care provider.  Zoster vaccine.** / 1 dose for adults aged 5 years or older.  Pneumococcal 13-valent conjugate (PCV13) vaccine.** / Consult your health care provider.  Pneumococcal polysaccharide (PPSV23) vaccine.** / 1 dose for all adults aged 74 years and older.  Meningococcal vaccine.** / Consult your health care provider.  Hepatitis A vaccine.** / Consult your health care provider.  Hepatitis B vaccine.** / Consult your health care provider.  Haemophilus influenzae type b (Hib) vaccine.** / Consult your health care provider. ** Family history and personal history of risk and conditions may change your health care provider's recommendations. Document Released: 12/08/2001 Document Revised: 08/02/2013  [facility] Patient Information 2014 [place], [place].   EXERCISE AND DIET:  We recommended that you start or continue a regular exercise program for good health. Regular exercise means any activity that makes your heart beat faster and makes you sweat.  We recommend exercising at least 30 minutes per day at least 3 days a week, preferably 5.  We also recommend a diet low in fat and sugar / carbohydrates.  Inactivity, poor dietary choices and obesity can cause diabetes, heart attack, stroke, and kidney damage, among others.     ALCOHOL AND SMOKING:  Women should limit their alcohol intake to no  more than 7 drinks/beers/glasses of wine (combined, not each!) per week. Moderation of alcohol intake to this level decreases your risk of breast cancer and liver damage.  ( And of course, no recreational drugs are part of a healthy lifestyle.)  Also, you should not be smoking at all or even being exposed to second hand smoke. Most people know smoking can  cause cancer, and various heart and lung diseases, but did you know it also contributes to weakening of your bones?  Aging of your skin?  Yellowing of your teeth and nails?   CALCIUM AND VITAMIN D:  Adequate intake of calcium and Vitamin D are recommended.  The recommendations for exact amounts of these supplements seem to change often, but generally speaking 600 mg of calcium (either carbonate or citrate) and 800 units of Vitamin D per day seems prudent. Certain women may benefit from higher intake of Vitamin D.  If you are among these women, your doctor will have told you during your visit.     PAP SMEARS:  Pap smears, to check for cervical cancer or precancers,  have traditionally been done yearly, although recent scientific advances have shown that most women can have pap smears less often.  However, every woman still should have a physical exam from her gynecologist or primary care physician every year. It will include a breast check, inspection of the vulva and vagina to check for abnormal growths or skin changes, a visual exam of the cervix, and then an exam to evaluate the size and shape of the uterus and ovaries.  And after 58 years of age, a rectal exam is indicated to check for rectal cancers. We will also provide age appropriate advice regarding health maintenance, like when you should have certain vaccines, screening for sexually transmitted diseases, bone density testing, colonoscopy, mammograms, etc.    MAMMOGRAMS:  All women over 65 years old should have a yearly mammogram. Many facilities now offer a "3D" mammogram, which may cost  around $50 extra out of pocket. If possible,  we recommend you accept the option to have the 3D mammogram performed.  It both reduces the number of women who will be called back for extra views which then turn out to be normal, and it is better than the routine mammogram at detecting truly abnormal areas.     COLONOSCOPY:  Colonoscopy to screen for colon cancer is recommended for all women at age 26.  We know, you hate the idea of the prep.  We agree, BUT, having colon cancer and not knowing it is worse!!  Colon cancer so often starts as a polyp that can be seen and removed at colonscopy, which can quite literally save your life!  And if your first colonoscopy is normal and you have no family history of colon cancer, most women don't have to have it again for 10 years.  Once every ten years, you can do something that may end up saving your life, right?  We will be happy to help you get it scheduled when you are ready.  Be sure to check your insurance coverage so you understand how much it will cost.  It may be covered as a preventative service at no cost, but you should check your particular policy.    Mediterranean Diet A Mediterranean diet refers to food and lifestyle choices that are based on the traditions of countries located on the [employer]. This way of eating has been shown to help prevent certain conditions and improve outcomes for people who have chronic diseases, like kidney disease and heart disease. What are tips for following this plan? Lifestyle  Cook and eat meals together with your family, when possible.  Drink enough fluid to keep your urine clear or pale yellow.  Be physically active every day. This includes: ? Aerobic exercise like running or swimming. ? Leisure activities like  gardening, walking, or housework.  Get 7-8 hours of sleep each night.  If recommended by your health care provider, drink red wine in moderation. This means 1 glass a day for nonpregnant women  and 2 glasses a day for men. A glass of wine equals 5 oz (150 mL). Reading food labels  Check the serving size of packaged foods. For foods such as rice and pasta, the serving size refers to the amount of cooked product, not dry.  Check the total fat in packaged foods. Avoid foods that have saturated fat or trans fats.  Check the ingredients list for added sugars, such as corn syrup. Shopping  At the grocery store, buy most of your food from the areas near the walls of the store. This includes: ? Fresh fruits and vegetables (produce). ? Grains, beans, nuts, and seeds. Some of these may be available in unpackaged forms or large amounts (in bulk). ? Fresh seafood. ? Poultry and eggs. ? Low-fat dairy products.  Buy whole ingredients instead of prepackaged foods.  Buy fresh fruits and vegetables in-season from local farmers markets.  Buy frozen fruits and vegetables in resealable bags.  If you do not have access to quality fresh seafood, buy precooked frozen shrimp or canned fish, such as tuna, salmon, or sardines.  Buy small amounts of raw or cooked vegetables, salads, or olives from the deli or salad bar at your store.  Stock your pantry so you always have certain foods on hand, such as olive oil, canned tuna, canned tomatoes, rice, pasta, and beans. Cooking  Cook foods with extra-virgin olive oil instead of using butter or other vegetable oils.  Have meat as a side dish, and have vegetables or grains as your main dish. This means having meat in small portions or adding small amounts of meat to foods like pasta or stew.  Use beans or vegetables instead of meat in common dishes like chili or lasagna.  Experiment with different cooking methods. Try roasting or broiling vegetables instead of steaming or sauteing them.  Add frozen vegetables to soups, stews, pasta, or rice.  Add nuts or seeds for added healthy fat at each meal. You can add these to yogurt, salads, or vegetable  dishes.  Marinate fish or vegetables using olive oil, lemon juice, garlic, and fresh herbs. Meal planning  Plan to eat 1 vegetarian meal one day each week. Try to work up to 2 vegetarian meals, if possible.  Eat seafood 2 or more times a week.  Have healthy snacks readily available, such as: ? Vegetable sticks with hummus. ? [place] yogurt. ? Fruit and nut trail mix.  Eat balanced meals throughout the week. This includes: ? Fruit: 2-3 servings a day ? Vegetables: 4-5 servings a day ? Low-fat dairy: 2 servings a day ? Fish, poultry, or lean meat: 1 serving a day ? Beans and legumes: 2 or more servings a week ? Nuts and seeds: 1-2 servings a day ? Whole grains: 6-8 servings a day ? Extra-virgin olive oil: 3-4 servings a day  Limit red meat and sweets to only a few servings a month What are my food choices?  Mediterranean diet ? Recommended ? Grains: Whole-grain pasta. Brown rice. Bulgar wheat. Polenta. Couscous. Whole-wheat bread. [place]. ? Vegetables: Artichokes. Beets. Broccoli. Cabbage. Carrots. Eggplant. Green beans. Chard. Kale. Spinach. Onions. Leeks. Peas. Squash. Tomatoes. Peppers. Radishes. ? Fruits: Apples. Apricots. Avocado. Berries. Bananas. Cherries. Dates. Figs. Grapes. Lemons. Melon. Oranges. Peaches. Plums. Pomegranate. ? Meats and other protein  foods: Beans. Almonds. Sunflower seeds. Pine nuts. Peanuts. [place]. Salmon. Scallops. Shrimp. [place]. Tilapia. Clams. Oysters. Eggs. ? Dairy: Low-fat milk. Cheese. Greek yogurt. ? Beverages: Water. Red wine. Herbal tea. ? Fats and oils: Extra virgin olive oil. Avocado oil. Grape seed oil. ? Sweets and desserts: [place] yogurt with honey. Baked apples. Poached pears. Trail mix. ? Seasoning and other foods: Basil. Cilantro. Coriander. Cumin. Mint. Parsley. Sage. Rosemary. Tarragon. Garlic. Oregano. Thyme. Pepper. Balsalmic vinegar. Tahini. Hummus. Tomato sauce. Olives. Mushrooms. ? Limit these ? Grains: Prepackaged pasta or  rice dishes. Prepackaged cereal with added sugar. ? Vegetables: Deep fried potatoes (french fries). ? Fruits: Fruit canned in syrup. ? Meats and other protein foods: Beef. Pork. Lamb. Poultry with skin. Hot dogs. [place] Sal[place]. ? Dairy: Ice cream. Sour cream. Whole milk. ? Beverages: Juice. Sugar-sweetened soft drinks. Beer. Liquor and spirits. ? Fats and oils: Butter. Canola oil. Vegetable oil. Beef fat (tallow). Lard. ? Sweets and desserts: Cookies. Cakes. Pies. Candy. ? Seasoning and other foods: Mayonnaise. Premade sauces and marinades. ? The items listed may not be a complete list. Talk with your dietitian about what dietary choices are right for you. Summary  The Mediterranean diet includes both food and lifestyle choices.  Eat a variety of fresh fruits and vegetables, beans, nuts, seeds, and whole grains.  Limit the amount of red meat and sweets that you eat.  Talk with your health care provider about whether it is safe for you to drink red wine in moderation. This means 1 glass a day for nonpregnant women and 2 glasses a day for men. A glass of wine equals 5 oz (150 mL). This information is not intended to replace advice given to you by your health care provider. Make sure you discuss any questions you have with your health care provider. Document Released: 06/04/2016 Document Revised: 07/07/2016 Document Reviewed: 06/04/2016 Elsevier Interactive Patient Education  2018 [place] to drink plenty of water and follow Mediterranean diet. Continue medications as directed, please call pharmacy when you need refills. We will call you when lab results are available. Continue yoga/meditation. Continue to reduce-stop tobacco use-you can do it! Follow-up in 6 months. NICE TO SEE YOU!

## 2018-08-16 NOTE — Assessment & Plan Note (Signed)
The 10-year ASCVD risk score Felicia Good., et al., 2013) is: 4.7%   Values used to calculate the score:     Age: 57 years     Sex: Female     Is Non-Hispanic African American: No     Diabetic: No     Tobacco smoker: Yes     Systolic Blood Pressure: 97 mmHg     Is BP treated: No     HDL Cholesterol: 52 mg/dL     Total Cholesterol: 266 mg/dL  LDL-180 Not currently on statin Hx of elevated LFTs Fasting labs obtained today

## 2018-08-17 LAB — CBC WITH DIFFERENTIAL/PLATELET
BASOS: 1 %
Basophils Absolute: 0 10*3/uL (ref 0.0–0.2)
EOS (ABSOLUTE): 0.2 10*3/uL (ref 0.0–0.4)
Eos: 3 %
Hematocrit: 37.1 % (ref 34.0–46.6)
Hemoglobin: 12.9 g/dL (ref 11.1–15.9)
IMMATURE GRANS (ABS): 0 10*3/uL (ref 0.0–0.1)
Immature Granulocytes: 0 %
Lymphocytes Absolute: 2.2 10*3/uL (ref 0.7–3.1)
Lymphs: 40 %
MCH: 32.3 pg (ref 26.6–33.0)
MCHC: 34.8 g/dL (ref 31.5–35.7)
MCV: 93 fL (ref 79–97)
Monocytes Absolute: 0.3 10*3/uL (ref 0.1–0.9)
Monocytes: 6 %
NEUTROS PCT: 50 %
Neutrophils Absolute: 2.8 10*3/uL (ref 1.4–7.0)
PLATELETS: 221 10*3/uL (ref 150–450)
RBC: 3.99 x10E6/uL (ref 3.77–5.28)
RDW: 13.1 % (ref 12.3–15.4)
WBC: 5.6 10*3/uL (ref 3.4–10.8)

## 2018-08-19 ENCOUNTER — Ambulatory Visit (INDEPENDENT_AMBULATORY_CARE_PROVIDER_SITE_OTHER): Payer: BLUE CROSS/BLUE SHIELD

## 2018-08-19 DIAGNOSIS — R945 Abnormal results of liver function studies: Secondary | ICD-10-CM

## 2018-08-19 DIAGNOSIS — Z23 Encounter for immunization: Secondary | ICD-10-CM | POA: Diagnosis not present

## 2018-08-19 DIAGNOSIS — R7989 Other specified abnormal findings of blood chemistry: Secondary | ICD-10-CM

## 2018-08-19 DIAGNOSIS — E785 Hyperlipidemia, unspecified: Secondary | ICD-10-CM | POA: Diagnosis not present

## 2018-08-19 DIAGNOSIS — Z0000 Encounter for general adult medical examination without abnormal findings: Secondary | ICD-10-CM

## 2018-08-19 NOTE — Progress Notes (Signed)
Pt here for shingles vaccine.  Screening questionnaire reviewed, VIS provided to patient, and any/all patient questions answered.  T. Nelson, CMA  

## 2018-08-20 LAB — COMPREHENSIVE METABOLIC PANEL
A/G RATIO: 2 (ref 1.2–2.2)
ALK PHOS: 66 IU/L (ref 39–117)
ALT: 35 IU/L — AB (ref 0–32)
AST: 28 IU/L (ref 0–40)
Albumin: 4.5 g/dL (ref 3.5–5.5)
BILIRUBIN TOTAL: 0.3 mg/dL (ref 0.0–1.2)
BUN/Creatinine Ratio: 22 (ref 9–23)
BUN: 18 mg/dL (ref 6–24)
CO2: 25 mmol/L (ref 20–29)
Calcium: 9.8 mg/dL (ref 8.7–10.2)
Chloride: 104 mmol/L (ref 96–106)
Creatinine, Ser: 0.81 mg/dL (ref 0.57–1.00)
GFR calc Af Amer: 93 mL/min/{1.73_m2} (ref >59)
GFR, EST NON AFRICAN AMERICAN: 80 mL/min/{1.73_m2} (ref >59)
GLOBULIN, TOTAL: 2.2 g/dL (ref 1.5–4.5)
Glucose: 96 mg/dL (ref 65–99)
POTASSIUM: 4.4 mmol/L (ref 3.5–5.2)
SODIUM: 143 mmol/L (ref 134–144)
Total Protein: 6.7 g/dL (ref 6.0–8.5)

## 2018-08-20 LAB — HEMOGLOBIN A1C
ESTIMATED AVERAGE GLUCOSE: 108 mg/dL
HEMOGLOBIN A1C: 5.4 % (ref 4.8–5.6)

## 2018-08-20 LAB — LIPID PANEL
CHOL/HDL RATIO: 5.2 ratio — AB (ref 0.0–4.4)
Cholesterol, Total: 277 mg/dL — ABNORMAL HIGH (ref 100–199)
HDL: 53 mg/dL (ref >39)
LDL Calculated: 198 mg/dL — ABNORMAL HIGH (ref 0–99)
TRIGLYCERIDES: 129 mg/dL (ref 0–149)
VLDL Cholesterol Cal: 26 mg/dL (ref 5–40)

## 2018-08-20 LAB — TSH: TSH: 1.45 u[IU]/mL (ref 0.450–4.500)

## 2018-09-08 ENCOUNTER — Emergency Department (HOSPITAL_COMMUNITY): Payer: BLUE CROSS/BLUE SHIELD

## 2018-09-08 ENCOUNTER — Emergency Department (HOSPITAL_COMMUNITY)
Admission: EM | Admit: 2018-09-08 | Discharge: 2018-09-08 | Disposition: A | Discharge status: Home / Self Care | Payer: BLUE CROSS/BLUE SHIELD | Attending: Emergency Medicine | Admitting: Emergency Medicine

## 2018-09-08 ENCOUNTER — Ambulatory Visit (INDEPENDENT_AMBULATORY_CARE_PROVIDER_SITE_OTHER): Payer: BLUE CROSS/BLUE SHIELD | Admitting: Adult Health

## 2018-09-08 ENCOUNTER — Encounter (HOSPITAL_COMMUNITY): Payer: Self-pay

## 2018-09-08 ENCOUNTER — Encounter: Payer: Self-pay | Admitting: Adult Health

## 2018-09-08 VITALS — BP 120/84 | HR 74 | Temp 97.7°F | Ht 67.75 in | Wt 147.3 lb

## 2018-09-08 DIAGNOSIS — F17210 Nicotine dependence, cigarettes, uncomplicated: Secondary | ICD-10-CM | POA: Diagnosis not present

## 2018-09-08 DIAGNOSIS — R1084 Generalized abdominal pain: Secondary | ICD-10-CM | POA: Diagnosis not present

## 2018-09-08 DIAGNOSIS — Z79899 Other long term (current) drug therapy: Secondary | ICD-10-CM | POA: Insufficient documentation

## 2018-09-08 DIAGNOSIS — Q446 Cystic disease of liver: Secondary | ICD-10-CM | POA: Insufficient documentation

## 2018-09-08 DIAGNOSIS — R945 Abnormal results of liver function studies: Secondary | ICD-10-CM | POA: Diagnosis not present

## 2018-09-08 DIAGNOSIS — K7689 Other specified diseases of liver: Secondary | ICD-10-CM

## 2018-09-08 DIAGNOSIS — R7989 Other specified abnormal findings of blood chemistry: Secondary | ICD-10-CM

## 2018-09-08 DIAGNOSIS — R197 Diarrhea, unspecified: Secondary | ICD-10-CM | POA: Diagnosis not present

## 2018-09-08 LAB — CBC WITH DIFFERENTIAL/PLATELET
ABS IMMATURE GRANULOCYTES: 0.01 10*3/uL (ref 0.00–0.07)
Basophils Absolute: 0 10*3/uL (ref 0.0–0.1)
Basophils Relative: 1 %
EOS PCT: 2 %
Eosinophils Absolute: 0.1 10*3/uL (ref 0.0–0.5)
HCT: 40 % (ref 36.0–46.0)
HEMOGLOBIN: 13.2 g/dL (ref 12.0–15.0)
Immature Granulocytes: 0 %
LYMPHS PCT: 36 %
Lymphs Abs: 1.8 10*3/uL (ref 0.7–4.0)
MCH: 31.4 pg (ref 26.0–34.0)
MCHC: 33 g/dL (ref 30.0–36.0)
MCV: 95 fL (ref 80.0–100.0)
MONO ABS: 0.4 10*3/uL (ref 0.1–1.0)
MONOS PCT: 8 %
NEUTROS ABS: 2.7 10*3/uL (ref 1.7–7.7)
Neutrophils Relative %: 53 %
Platelets: 223 10*3/uL (ref 150–400)
RBC: 4.21 MIL/uL (ref 3.87–5.11)
RDW: 13.2 % (ref 11.5–15.5)
WBC: 5 10*3/uL (ref 4.0–10.5)
nRBC: 0 % (ref 0.0–0.2)

## 2018-09-08 LAB — COMPREHENSIVE METABOLIC PANEL
ALBUMIN: 4 g/dL (ref 3.5–5.0)
ALT: 54 U/L — AB (ref 0–44)
AST: 42 U/L — ABNORMAL HIGH (ref 15–41)
Alkaline Phosphatase: 61 U/L (ref 38–126)
Anion gap: 9 (ref 5–15)
BUN: 13 mg/dL (ref 6–20)
CALCIUM: 9.8 mg/dL (ref 8.9–10.3)
CHLORIDE: 105 mmol/L (ref 98–111)
CO2: 24 mmol/L (ref 22–32)
CREATININE: 0.68 mg/dL (ref 0.44–1.00)
GFR calc non Af Amer: >60 mL/min (ref >60)
GLUCOSE: 105 mg/dL — AB (ref 70–99)
Potassium: 3.7 mmol/L (ref 3.5–5.1)
SODIUM: 138 mmol/L (ref 135–145)
Total Bilirubin: 0.4 mg/dL (ref 0.3–1.2)
Total Protein: 6.9 g/dL (ref 6.5–8.1)

## 2018-09-08 LAB — URINALYSIS, ROUTINE W REFLEX MICROSCOPIC
BILIRUBIN URINE: NEGATIVE
Glucose, UA: NEGATIVE mg/dL
HGB URINE DIPSTICK: NEGATIVE
Ketones, ur: 20 mg/dL — AB
Leukocytes, UA: NEGATIVE
Nitrite: NEGATIVE
Protein, ur: NEGATIVE mg/dL
Specific Gravity, Urine: 1.008 (ref 1.005–1.030)
pH: 8 (ref 5.0–8.0)

## 2018-09-08 LAB — LIPASE, BLOOD: Lipase: 43 U/L (ref 11–51)

## 2018-09-08 MED ORDER — DICYCLOMINE HCL 20 MG PO TABS: 20.0000 mg | ORAL_TABLET | Freq: Three times a day (TID) | ORAL | 0 refills | Status: DC

## 2018-09-08 MED ORDER — SODIUM CHLORIDE 0.9 % IV BOLUS
1000.0000 mL | Freq: Once | INTRAVENOUS | Status: AC
  Administered 2018-09-08: 1000 mL via INTRAVENOUS

## 2018-09-08 MED ORDER — IOHEXOL 300 MG/ML  SOLN
100.0000 mL | Freq: Once | INTRAMUSCULAR | Status: AC | PRN
  Administered 2018-09-08: 100 mL via INTRAVENOUS

## 2018-09-08 NOTE — Assessment & Plan Note (Addendum)
Due to severity of sx's, advised to be seen at nearest at ED Pt agreeable, husband at [facility] to drive her to nearest facility  Placed referral to GI

## 2018-09-08 NOTE — ED Provider Notes (Signed)
[place] EMERGENCY DEPARTMENT Provider Note   CSN: [id number] Arrival date & time: 09/08/18  1448     History   Chief Complaint Chief Complaint  Patient presents with  . Abdominal Pain    HPI Felicia Good is a 57 y.o. female.     57 year old female presents with abdominal pain.  Patient states is been ongoing for about 6 weeks.  It comes and goes.  She will have multiple attacks each week and a seem to be coming more often.  Typically these will last about 2 or 3 hours.  The pain is constant and severe during the time.  Today she went to see her doctor during when these episodes and rated about a 5 out of 10.  She was sent here for CT scan.  This particular episode started around 1 PM and is currently improved, rates her pain is about a 3.  She has vomited once or twice throughout these multiple episodes in total but often has diarrhea.  The pain will start, feels like a severe cramping, and then she will have a bowel movement.  These will become progressively looser.  In between episodes, she has normal bowel movements and no constipation or diarrhea.  There is no blood in her stool.  No fevers.  She took Pepto-Bismol once which seemed to help one time but then other times she threw it up.  Past Medical History:  Diagnosis Date  . Anxiety   . Depression   . IBS (irritable bowel syndrome)   . Migraine headache   . Mitral valve prolapse   . Panic attacks   . Rectal polyp   . RLS (restless legs syndrome)     Patient Active Problem List   Diagnosis Date Noted  . Generalized abdominal pain 09/08/2018  . Oral pharyngeal candidiasis 08/25/2017  . 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 Thrush, oral 07/23/2017  . Elevated liver function tests 07/15/2017  . Hyperlipidemia 07/15/2017  . Anxiety 07/15/2017  . Chronic sinusitis 06/14/2017  . Healthcare maintenance 06/14/2017  . PANIC DISORDER,NO AGORAPHOBIA 10/13/2010  . TOBACCO ABUSE 08/13/2008  . BACK PAIN  01/19/2008  . URTICARIA NOS 08/15/2007  . Depression 06/15/2007    Past Surgical History:  Procedure Laterality Date  . rectal polyp removed  1987   Local anesthesia used  . RECTAL POLYPECTOMY       OB History   None      Home Medications    Prior to Admission medications   Medication Sig Start Date End Date Taking? Authorizing Provider  Cholecalciferol (VITAMIN D3) 2000 units TABS Take 1 tablet by mouth daily.    [provider]  citalopram (CELEXA) 40 MG tablet TAKE 1 TABLET BY MOUTH DAILY. 04/26/18   Danford, Valetta Fuller D, NP  clonazePAM (KLONOPIN) 0.5 MG tablet Take 1 tablet (0.5 mg total) by mouth at bedtime as needed for anxiety. 05/16/18   Danford, Valetta Fuller D, NP  cyclobenzaprine (FLEXERIL) 10 MG tablet Take 10 mg by mouth 3 (three) times daily as needed for muscle spasms.    [provider]  dicyclomine (BENTYL) 20 MG tablet Take 1 tablet (20 mg total) by mouth 3 (three) times daily before meals. 09/08/18   Sherwood Gambler, MD  fluticasone (FLONASE) 50 MCG/ACT nasal spray Place 1 spray into both nostrils 2 (two) times daily as needed for allergies or rhinitis.    [provider]  Prenatal Vit-Fe Fumarate-FA (PRENATAL VITAMIN PO) Take 1 tablet by mouth daily.  [provider]  TRIAMCINOLONE ACETONIDE EX 1 application Topical 2 times daily    [provider]    Family History Family History  Problem Relation Age of Onset  . Other Father        esophageal problems  . Heart failure Father   . Cirrhosis Father        Non alcoholic   . Stroke Father        Died in 65   . Hypertension Father   . Hypertension Mother   . Stroke Mother   . Dementia Mother   . Breast cancer Maternal Aunt   . Colon cancer Neg Hx     Social History Social History   Tobacco Use  . Smoking status: Current Every Day Smoker    Packs/day: 0.25    Years: 40.00    Pack years: 10.00    Types: Cigarettes  . Smokeless tobacco: Never Used  . Tobacco  comment: has cut back  Substance Use Topics  . Alcohol use: Yes    Alcohol/week: 0.0 standard drinks    Comment: occ  . Drug use: No     Allergies   Patient has no known allergies.   Review of Systems Review of Systems  Constitutional: Negative for fever.  Respiratory: Negative for shortness of breath.   Cardiovascular: Negative for chest pain.  Gastrointestinal: Positive for abdominal pain and diarrhea. Negative for blood in stool.  Genitourinary: Negative for dysuria.  All other systems reviewed and are negative.    Physical Exam Updated Vital Signs BP (!) 112/40 (BP Location: Right Arm)   Pulse 63   Temp 98.2 F (36.8 C) (Oral)   Resp 17   Ht 5' 7.5" (1.715 m)   Wt 66.7 kg   LMP 11/26/2013   SpO2 100%   BMI 22.68 kg/m   Physical Exam  Constitutional: She appears well-developed and well-nourished.  Non-toxic appearance. She does not appear ill. No distress.  Sitting up, resting comfortably  HENT:  Head: Normocephalic and atraumatic.  Right Ear: External ear normal.  Left Ear: External ear normal.  Nose: Nose normal.  Eyes: Right eye exhibits no discharge. Left eye exhibits no discharge.  Cardiovascular: Normal rate, regular rhythm and normal heart sounds.  Pulmonary/Chest: Effort normal and breath sounds normal.  Abdominal: Soft. There is generalized tenderness (mild). There is no CVA tenderness.  Neurological: She is alert.  Skin: Skin is warm and dry.  Psychiatric: Her mood appears not anxious.  Nursing note and vitals reviewed.    ED Treatments / Results  Labs (all labs ordered are listed, but only abnormal results are displayed) Labs Reviewed  COMPREHENSIVE METABOLIC PANEL - Abnormal; Notable for the following components:      Result Value   Glucose, Bld 105 (*)    AST 42 (*)    ALT 54 (*)    All other components within normal limits  URINALYSIS, ROUTINE W REFLEX MICROSCOPIC - Abnormal; Notable for the following components:   Ketones, ur 20  (*)    All other components within normal limits  LIPASE, BLOOD  CBC WITH DIFFERENTIAL/PLATELET    EKG None  Radiology Ct Abdomen Pelvis W Contrast  Result Date: 09/08/2018 CLINICAL DATA:  Mid abdominal cramping today with nausea and diarrhea. EXAM: CT ABDOMEN AND PELVIS WITH CONTRAST TECHNIQUE: Multidetector CT imaging of the abdomen and pelvis was performed using the standard protocol following bolus administration of intravenous contrast. CONTRAST:  161mL OMNIPAQUE IOHEXOL 300 MG/ML  SOLN COMPARISON:  11/15/2017 abdominal ultrasound FINDINGS: Lower chest: Normal heart size without pericardial effusion. Mild pleural thickening along the major fissure on the right. Hepatobiliary: 11 mm cyst or hemangioma in the left hepatic lobe. Gallbladder is free of stones. No secondary signs of acute cholecystitis. No biliary dilatation. Patent portal and splenic veins. Pancreas: Normal Spleen: Normal Adrenals/Urinary Tract: Normal bilateral adrenal glands and kidneys. The urinary bladder is unremarkable for the degree of distention. Stomach/Bowel: Large amount of retained stool from splenic flexure to the rectum consistent with constipation. No bowel obstruction or definite inflammation. The stomach is contracted in appearance. Duodenal sweep and ligament of Treitz are unremarkable. Contracted right colon limiting further assessment for inflammatory change. The appendix is not confidently identified. Vascular/Lymphatic: Nonaneurysmal abdominal aorta. Patent branch vessels. No lymphadenopathy. Mild prominence of periuterine vessels may reflect stigmata of pelvic vascular congestion Reproductive: Uterus and bilateral adnexa are unremarkable. Other: No abdominal wall hernia or abnormality. No abdominopelvic ascites. Musculoskeletal: S1-S2 disc. No acute osseous abnormality. IMPRESSION: 1. Increased colonic stool burden consistent with constipation. No bowel obstruction or inflammation. 2. 11 mm cyst or hemangioma in  the left hepatic lobe. 3. Mild prominence of periuterine vessels which may reflect stigmata of pelvic vascular congestion syndrome. Electronically Signed   By: Ashley Royalty M.D.   On: 09/08/2018 17:43    Procedures Procedures (including critical care time)  Medications Ordered in ED Medications  sodium chloride 0.9 % bolus 1,000 mL (0 mLs Intravenous Stopped 09/08/18 1826)  iohexol (OMNIPAQUE) 300 MG/ML solution 100 mL (100 mLs Intravenous Contrast Given 09/08/18 1711)     Initial Impression / Assessment and Plan / ED Course  I have reviewed the triage vital signs and the nursing notes.  Pertinent labs & imaging results that were available during my care of the patient were reviewed by me and considered in my medical decision making (see chart for details).     I discussed the CT findings including hemangioma versus cyst of the liver.  She states abnormal liver function tests are similar to before.  No clear cause of her abdominal pain, constipation may be playing a role but there is no impaction.  Patient is feeling fine at this time.  I will prescribe her Bentyl but otherwise she is to follow-up with GI.  She wants her PCP to refer her and will call PCP tomorrow.  Return precautions.  Final Clinical Impressions(s) / ED Diagnoses   Final diagnoses:  Generalized abdominal pain  Liver cyst  Abnormal LFTs    ED Discharge Orders         Ordered    dicyclomine (BENTYL) 20 MG tablet  3 times daily before meals     09/08/18 1818           Sherwood Gambler, MD 09/08/18 1913

## 2018-09-08 NOTE — ED Notes (Signed)
Patient able to ambulate independently  

## 2018-09-08 NOTE — ED Triage Notes (Signed)
Pt c/o abdominal cramping and pain x2 weeks w/ diarrhea.  PCP requests CT scan.

## 2018-09-08 NOTE — ED Notes (Signed)
Rhea from main lab pulling blood that was stored as saved

## 2018-09-08 NOTE — Patient Instructions (Addendum)
Due to severity of sx's, advised to be seen at nearest at ED Pt agreeable, husband at [facility] to drive her to nearest facility  Placed referral to GI

## 2018-09-08 NOTE — Discharge Instructions (Signed)
There is a cyst seen on her liver.  She will need to follow-up with your family doctor for this.  If you develop severe worsening abdominal pain, vomiting, fever, or any other new/concerning symptoms and return to the ER for evaluation.

## 2018-09-08 NOTE — Progress Notes (Signed)
Subjective:    Patient ID: Felicia Good, female    DOB: [birth year], 57 y.o.   MRN: [id number]  HPI:  Ms. Blunck presents with acute diffuse abdominal pain that started 45 minutes ago Pain is constant, described as "cramping, throbbing" rated 5/10. She also reports nausea without vomiting. She denies eating anything unusual prior to onset of sx's. She reports this cascade of abdominal sx's started about 6 weeks ago and have been occurring every 7-10 days. Sx's will last a few hours and will eventually resolve after several rounds of diarrhea. She denies recent travel outside [place]  She reports her last colonoscopy was normal She denies hematochezia/hematouria Husband at [facility] during [place]  Patient Care Team    Relationship Specialty Notifications Start End  Esaw Grandchild, NP PCP - General Family Medicine  06/14/17   Sanjuana Kava, MD Referring Physician Obstetrics and Gynecology  06/14/17     Patient Active Problem List   Diagnosis Date Noted  . Generalized abdominal pain 09/08/2018  . Oral pharyngeal candidiasis 08/25/2017  . 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 Thrush, oral 07/23/2017  . Elevated liver function tests 07/15/2017  . Hyperlipidemia 07/15/2017  . Anxiety 07/15/2017  . Chronic sinusitis 06/14/2017  . Healthcare maintenance 06/14/2017  . PANIC DISORDER,NO AGORAPHOBIA 10/13/2010  . TOBACCO ABUSE 08/13/2008  . BACK PAIN 01/19/2008  . URTICARIA NOS 08/15/2007  . Depression 06/15/2007     Past Medical History:  Diagnosis Date  . Anxiety   . Depression   . IBS (irritable bowel syndrome)   . Migraine headache   . Mitral valve prolapse   . Panic attacks   . Rectal polyp   . RLS (restless legs syndrome)      Past Surgical History:  Procedure Laterality Date  . rectal polyp removed  1987   Local anesthesia used  . RECTAL POLYPECTOMY       Family History  Problem Relation Age of Onset  . Other Father        esophageal problems  . Heart failure Father   .  Cirrhosis Father        Non alcoholic   . Stroke Father        Died in 69   . Hypertension Father   . Hypertension Mother   . Stroke Mother   . Dementia Mother   . Breast cancer Maternal Aunt   . Colon cancer Neg Hx      Social History   Substance and Sexual Activity  Drug Use No     Social History   Substance and Sexual Activity  Alcohol Use Yes  . Alcohol/week: 0.0 standard drinks   Comment: occ     Social History   Tobacco Use  Smoking Status Current Every Day Smoker  . Packs/day: 0.25  . Years: 40.00  . Pack years: 10.00  . Types: Cigarettes  Smokeless Tobacco Never Used  Tobacco Comment   has cut back     Outpatient Encounter Medications as of 09/08/2018  Medication Sig  . Cholecalciferol (VITAMIN D3) 2000 units TABS Take 1 tablet by mouth daily.  . citalopram (CELEXA) 40 MG tablet TAKE 1 TABLET BY MOUTH DAILY.  . clonazePAM (KLONOPIN) 0.5 MG tablet Take 1 tablet (0.5 mg total) by mouth at bedtime as needed for anxiety.  . cyclobenzaprine (FLEXERIL) 10 MG tablet Take 10 mg by mouth 3 (three) times daily as needed for muscle spasms.  . fluticasone (FLONASE) 50 MCG/ACT nasal spray Place 1 spray  into both nostrils 2 (two) times daily as needed for allergies or rhinitis.  . Prenatal Vit-Fe Fumarate-FA (PRENATAL VITAMIN PO) Take 1 tablet by mouth daily.  . TRIAMCINOLONE ACETONIDE EX 1 application Topical 2 times daily   No facility-administered encounter medications on file as of 09/08/2018.     Allergies: Patient has no known allergies.  Body mass index is 22.56 kg/m.  Blood pressure 120/84, pulse 74, temperature 97.7 F (36.5 C), temperature source Oral, height 5' 7.75" (1.721 m), weight 147 lb 4.8 oz (66.8 kg), last menstrual period 11/26/2013, SpO2 99 %.  Review of Systems  Constitutional: Positive for activity change and appetite change.  Respiratory: Negative for shortness of breath.   Cardiovascular: Negative for chest pain.   Gastrointestinal: Positive for abdominal distention, abdominal pain and nausea.       Objective:   Physical Exam  Constitutional: She appears well-developed and well-nourished. She has a sickly appearance. She appears ill. She appears distressed.  Eyes: Pupils are equal, round, and reactive to light. EOM are normal.  Cardiovascular: Normal rate, regular rhythm, normal heart sounds and intact distal pulses.  Pulmonary/Chest: Effort normal and breath sounds normal.  Abdominal: Bowel sounds are decreased. There is generalized tenderness. There is no rigidity, no rebound, no guarding, no CVA tenderness, no tenderness at McBurney's point and negative Murphy's sign.  Neurological: She is alert.  Skin: Skin is warm and dry. Capillary refill takes less than 2 seconds.  Psychiatric: She has a normal mood and affect. Her behavior is normal.          Assessment & Plan:   1. Generalized abdominal pain     Generalized abdominal pain Due to severity of sx's, advised to be seen at nearest at ED Pt agreeable, husband at [facility] to drive her to nearest facility  Placed referral to GI    FOLLOW-UP:  Return if symptoms worsen or fail to improve.kd

## 2018-09-15 ENCOUNTER — Encounter: Payer: Self-pay | Admitting: Gastroenterology

## 2018-09-15 ENCOUNTER — Ambulatory Visit: Payer: BLUE CROSS/BLUE SHIELD | Admitting: Gastroenterology

## 2018-09-15 ENCOUNTER — Other Ambulatory Visit (INDEPENDENT_AMBULATORY_CARE_PROVIDER_SITE_OTHER): Payer: BLUE CROSS/BLUE SHIELD

## 2018-09-15 ENCOUNTER — Encounter (INDEPENDENT_AMBULATORY_CARE_PROVIDER_SITE_OTHER): Payer: Self-pay

## 2018-09-15 VITALS — BP 106/62 | HR 74 | Ht 67.0 in | Wt 148.0 lb

## 2018-09-15 DIAGNOSIS — K7689 Other specified diseases of liver: Secondary | ICD-10-CM | POA: Diagnosis not present

## 2018-09-15 DIAGNOSIS — R1084 Generalized abdominal pain: Secondary | ICD-10-CM | POA: Diagnosis not present

## 2018-09-15 DIAGNOSIS — R9389 Abnormal findings on diagnostic imaging of other specified body structures: Secondary | ICD-10-CM

## 2018-09-15 DIAGNOSIS — R748 Abnormal levels of other serum enzymes: Secondary | ICD-10-CM

## 2018-09-15 LAB — HEPATIC FUNCTION PANEL
ALT: 50 U/L — ABNORMAL HIGH (ref 0–35)
AST: 34 U/L (ref 0–37)
Albumin: 4.3 g/dL (ref 3.5–5.2)
Alkaline Phosphatase: 63 U/L (ref 39–117)
BILIRUBIN DIRECT: 0 mg/dL (ref 0.0–0.3)
TOTAL PROTEIN: 7.3 g/dL (ref 6.0–8.3)
Total Bilirubin: 0.4 mg/dL (ref 0.2–1.2)

## 2018-09-15 LAB — IGA: IgA: 187 mg/dL (ref 68–378)

## 2018-09-15 LAB — GLUCOSE, RANDOM: Glucose, Bld: 92 mg/dL (ref 70–99)

## 2018-09-15 LAB — FERRITIN: FERRITIN: 106.1 ng/mL (ref 10.0–291.0)

## 2018-09-15 LAB — IRON: IRON: 126 ug/dL (ref 42–145)

## 2018-09-15 MED ORDER — PANTOPRAZOLE SODIUM 40 MG PO TBEC: 40.0000 mg | DELAYED_RELEASE_TABLET | Freq: Two times a day (BID) | ORAL | 3 refills | Status: DC

## 2018-09-15 NOTE — Progress Notes (Signed)
Referring Provider: Esaw Grandchild, NP Primary Care Physician:  Esaw Grandchild, NP   Reason for Consultation:  Abdominal pain   IMPRESSION:  Abdominal pain  Abnormal liver enzymes ALT 54, AST 42    - testing negative for HCV, HBV, autoimmune hepatitis, A1A deficiency    - normal hepatobiliary tree on ultrasound 10/2017 and CT 08/2018 Abnormal CT: significant stool burden on CT scan 11/19, ? pelvic vascular congestion syndrome Small hepatic cyst - reassurance provided Normal colonoscopy with Dr. Olevia Perches in 2016  Etiology of abdominal pain is unclear. Unclear if the liver enzymes are related. The small hepatic cyst is not the cause of her elevated liver enzymes or her pain.  Considering biliary dyskinesia, PUD, gastritis, esophagitis, gastroparesis in the differential.  However, must also consider GYN etiologies given her recent CT scan.   Suspected hepatocellular process. Will proceed with labs to evaluate for common causes of hepatocellular injury including: viral hepatitis serologies, iron, ferritin, fasting insulin, fasting glucose,and autoimmune type I (ANA, AMA, ASMA, IgG). If these labs are non-diagnostic, will proceed with alpha-1-antitrypsin, TSH, and celiac serologies.  I reassured her that her isolated yellow eye lids would not reflect jaundice.  PLAN: Hepatic function panel Fasting insulin and glucose, ferritin, iron, IgG TTGA, IgA HIDA scan with [facility] Continue Bentyl Protonix 40 mg BID x 8 weeks Follow-up with GYN (Dr. Cathie Olden) to evaluate the abnormal CT scan findings EGD if HIDA scan is negative  HPI: Felicia Good is a 57 y.o. female referred for abdominal pain. "I need for you to figure out what's wrong with me." She was last seen by Edward Jolly in 10/2017 for abnormal liver enzymes. The abdominal pain is a new complaint.  Abdominal pain for  6 weeks. Episodes of postprandial abdominal pain and cramping. "Feel like labor pains."  Occurring with increasing frequency.  Occurs within 5 minutes of eating of any foods.  Lasts 3-4 hours. Associated nausea and postprandial stools. Stools start formed but are liquid before she is done.  In between episodes she has no GI symptoms. Trial of PeptoBismal helped on one occasion.  No associated stress and anxiety.   Initially thought she had the stomach flu. Some improvement with Bentyl. Side effect of feeling dizzy and feeling like she was under water. Appetite good. Weight stable.  No other associated symptoms. No identified exacerbating or relieving features.   She feels that her eyelids are yellow and is worried about this because of her history of elevated liver enzymes.   Review of EPIC shows:  Labs 11/08/17: A1A 101, ANA neg, AMA neg, IgG 920, HCV AB neg, HBSAg neg, HBcAb IgM neg Labs 08/19/18: AST 28, ALT 35, alk phos 66, TB 0.3 Labs 09/08/18: AST 42, ALT 54, alk phos 61, TB 0.4, lipase 43 Transaminases have fluctuated since the first available results in EPIC from 2018.   Abdominal ultrasound 11/15/17: normal  CT abd/pelvis 09/08/18: increased stool burden, no bowel obstruction, 45m cyst or hemangioma in the left hepatic lobe, mild prominence of periuterine vessels which may reflect stigmata of pelvic vascular congestion syndrome  Normal screening colonoscopy with Dr. BOlevia Perches5/11/16  Past Medical History:  Diagnosis Date  . Anxiety   . Depression   . IBS (irritable bowel syndrome)   . Migraine headache   . Mitral valve prolapse   . Panic attacks   . Rectal polyp   . RLS (restless legs syndrome)     Past Surgical History:  Procedure Laterality Date  .  rectal polyp removed  1987   Local anesthesia used  . RECTAL POLYPECTOMY      Current Outpatient Medications  Medication Sig Dispense Refill  . Cholecalciferol (VITAMIN D3) 2000 units TABS Take 1 tablet by mouth daily.    . citalopram (CELEXA) 40 MG tablet TAKE 1 TABLET BY MOUTH DAILY. 90 tablet 1  . clonazePAM (KLONOPIN) 0.5 MG tablet Take 1  tablet (0.5 mg total) by mouth at bedtime as needed for anxiety. 30 tablet 0  . cyclobenzaprine (FLEXERIL) 10 MG tablet Take 10 mg by mouth 3 (three) times daily as needed for muscle spasms.    Marland Kitchen dicyclomine (BENTYL) 20 MG tablet Take 1 tablet (20 mg total) by mouth 3 (three) times daily before meals. 20 tablet 0  . fluticasone (FLONASE) 50 MCG/ACT nasal spray Place 1 spray into both nostrils 2 (two) times daily as needed for allergies or rhinitis.    . Prenatal Vit-Fe Fumarate-FA (PRENATAL VITAMIN PO) Take 1 tablet by mouth daily.    . TRIAMCINOLONE ACETONIDE EX 1 application Topical 2 times daily     No current facility-administered medications for this visit.     Allergies as of 09/15/2018  . (No Known Allergies)    Family History  Problem Relation Age of Onset  . Other Father        esophageal problems  . Heart failure Father   . Cirrhosis Father        Non alcoholic   . Stroke Father        Died in 30   . Hypertension Father   . Hypertension Mother   . Stroke Mother   . Dementia Mother   . Breast cancer Maternal Aunt   . Colon cancer Neg Hx     Social History   Socioeconomic History  . Marital status: Married    Spouse name: [name]  . Number of children: 1  . Years of education: Not on file  . Highest education level: Not on file  Occupational History  . Occupation: caregiver  Social Needs  . Financial resource strain: Not on file  . Food insecurity:    Worry: Not on file    Inability: Not on file  . Transportation needs:    Medical: Not on file    Non-medical: Not on file  Tobacco Use  . Smoking status: Current Every Day Smoker    Packs/day: 0.25    Years: 40.00    Pack years: 10.00    Types: Cigarettes  . Smokeless tobacco: Never Used  . Tobacco comment: has cut back  Substance and Sexual Activity  . Alcohol use: Yes    Alcohol/week: 0.0 standard drinks    Comment: occ  . Drug use: No  . Sexual activity: Not Currently    Birth control/protection:  None  Lifestyle  . Physical activity:    Days per week: Not on file    Minutes per session: Not on file  . Stress: Not on file  Relationships  . Social connections:    Talks on phone: Not on file    Gets together: Not on file    Attends religious service: Not on file    Active member of club or organization: Not on file    Attends meetings of clubs or organizations: Not on file    Relationship status: Not on file  . Intimate partner violence:    Fear of current or ex partner: Not on file    Emotionally abused: Not on  file    Physically abused: Not on file    Forced sexual activity: Not on file  Other Topics Concern  . Not on file  Social History Narrative   Not employed    Married    One child       [place], she likes to go to [employer].     Review of Systems: 12 system ROS is negative except as noted above.  Filed Weights   09/15/18 1029  Weight: 148 lb (67.1 kg)    Physical Exam: Vital signs were reviewed. General:   Alert, well-nourished, pleasant and cooperative in NAD Head:  Normocephalic and atraumatic. Eyes:  Sclera clear, no icterus.   Conjunctiva pink. No scleral icterus.  Mouth:  No deformity or lesions.   Neck:  Supple; no thyromegaly. Lungs:  Clear throughout to auscultation.   No wheezes.  Heart:  Regular rate and rhythm; no murmurs Abdomen:  Soft, nontender, normal bowel sounds. No rebound or guarding. No hepatosplenomegaly. I am unable to reproduce her symptoms.  Rectal:  Deferred  Msk:  Symmetrical without gross deformities. Extremities:  No gross deformities or edema. Neurologic:  Alert and  oriented x4;  grossly nonfocal Skin:  No rash or bruise. No jaundice.  Psych:  Alert and cooperative. Normal mood and affect. Slightly pressured speech.    L. Tarri Glenn Md, MPH [facility] 09/15/2018, 10:46 AM

## 2018-09-15 NOTE — Patient Instructions (Signed)
Your provider has requested that you go to the basement level for lab work before leaving today. Press "B" on the elevator. The lab is located at the first door on the left as you exit the elevator.  We have sent the following medications to your pharmacy for you to pick up at your convenience: Protonix 40 mg twice a day   Continue Bentyl   Follow up with your OBGYN   You have been scheduled for a HIDA scan at [facility] (1st floor) on 09/30/18. Please arrive 15 minutes prior to your scheduled appointment at  5:85 am. Make certain not to have anything to eat or drink after midnight prior to your test. Should this appointment date or time not work well for you, please call radiology scheduling at [phone].  _____________________________________________________________________ hepatobiliary (HIDA) scan is an imaging procedure used to diagnose problems in the liver, gallbladder and bile ducts. In the HIDA scan, a radioactive chemical or tracer is injected into a vein in your arm. The tracer is handled by the liver like bile. Bile is a fluid produced and excreted by your liver that helps your digestive system break down fats in the foods you eat. Bile is stored in your gallbladder and the gallbladder releases the bile when you eat a meal. A special nuclear medicine scanner (gamma camera) tracks the flow of the tracer from your liver into your gallbladder and small intestine.  During your HIDA scan  You'll be asked to change into a hospital gown before your HIDA scan begins. Your health care team will position you on a table, usually on your back. The radioactive tracer is then injected into a vein in your arm.The tracer travels through your bloodstream to your liver, where it's taken up by the bile-producing cells. The radioactive tracer travels with the bile from your liver into your gallbladder and through your bile ducts to your small intestine.You may feel some pressure while the  radioactive tracer is injected into your vein. As you lie on the table, a special gamma camera is positioned over your abdomen taking pictures of the tracer as it moves through your body. The gamma camera takes pictures continually for about an hour. You'll need to keep still during the HIDA scan. This can become uncomfortable, but you may find that you can lessen the discomfort by taking deep breaths and thinking about other things. Tell your health care team if you're uncomfortable. The radiologist will watch on a computer the progress of the radioactive tracer through your body. The HIDA scan may be stopped when the radioactive tracer is seen in the gallbladder and enters your small intestine. This typically takes about an hour. In some cases extra imaging will be performed if original images aren't satisfactory, if morphine is given to help visualize the gallbladder or if the medication CCK is given to look at the contraction of the gallbladder. This test typically takes 2 hours to complete. ________________________________________________________________________   Felicia Good have been scheduled for an endoscopy. Please follow written instructions given to you at your visit today. If you use inhalers (even only as needed), please bring them with you on the day of your procedure. Your physician has requested that you go to www.startemmi.com and enter the access code given to you at your visit today. This web site gives a general overview about your procedure. However, you should still follow specific instructions given to you by our office regarding your preparation for the procedure.

## 2018-09-16 ENCOUNTER — Telehealth: Payer: Self-pay | Admitting: Adult Health

## 2018-09-16 LAB — IGG: IgG (Immunoglobin G), Serum: 920 mg/dL (ref 600–1640)

## 2018-09-16 LAB — TISSUE TRANSGLUTAMINASE, IGA: (TTG) AB, IGA: 1 U/mL

## 2018-09-16 NOTE — Telephone Encounter (Signed)
Pt came by office states is almost out of Rx given to her by ED provider when she went there 11/14--Pt has scheduled appt with GI specialist but not til next week & also scheduled for an Endoscopy but says can't get (Rx refill on :   dicyclomine (BENTYL) 20 MG tablet [[id number]]   Order Details  Dose: 20 mg Route: Oral Frequency: 3 times daily before meals  Dispense Quantity: 20 tablet Refills: 0 Fills remaining: --        Sig: Take 1 tablet (20 mg total) by mouth 3 (three) times daily before meals.          From neither (Specialist) nor (ED doctor ) and can't go without Rx.  --forwarding request to Dr. Raliegh Scarlet since pt's provider Valetta Fuller Loleta Books is out of office for the week)  Pls send to : [place], [place] - [address] [phone] (Phone) [phone] (Fax)   If approved.  -glh

## 2018-09-16 NOTE — Telephone Encounter (Signed)
Patient was given medication for abdominal pain/ IBS.  Patient sees Valetta Fuller as PCP and LOV was 09/08/2018.  Please review and advise. MPulliam, CMA/RT(R)

## 2018-09-19 ENCOUNTER — Telehealth: Payer: Self-pay | Admitting: Gastroenterology

## 2018-09-19 LAB — INSULIN, FREE AND TOTAL
Free Insulin: 7.2 uU/mL
TOTAL INSULIN: 7.2 uU/mL

## 2018-09-19 MED ORDER — DICYCLOMINE HCL 20 MG PO TABS: 20.0000 mg | ORAL_TABLET | Freq: Three times a day (TID) | ORAL | 1 refills | Status: DC

## 2018-09-19 NOTE — Telephone Encounter (Signed)
She will need to get meds for her GI sx from her GI provider

## 2018-09-19 NOTE — Telephone Encounter (Signed)
Patient notified and will call GI for refill. MPulliam, CMA/RT(R)

## 2018-09-19 NOTE — Addendum Note (Signed)
Addended by: Wyline Beady on: 09/19/2018 01:37 PM   Modules accepted: Orders

## 2018-09-19 NOTE — Telephone Encounter (Signed)
Pt called requesting prescription for dicyclomine, she said that she was prescribed that at the ER but she is out and her stomach hurts if she does not take it. She uses [facility] on [address].

## 2018-09-19 NOTE — Telephone Encounter (Signed)
Rx sent to pharmacy. Patient informed.

## 2018-09-19 NOTE — Telephone Encounter (Signed)
Morning Melissa, I agree with Dr. Raliegh Scarlet, she should request Dicyclomine (Bentyl) refills from her GI. She was seen by [facility] 09/15/18, so she should be able to call and request med refill Thanks! Valetta Fuller

## 2018-09-26 ENCOUNTER — Encounter: Payer: Self-pay | Admitting: Gastroenterology

## 2018-09-27 ENCOUNTER — Telehealth: Payer: Self-pay | Admitting: Gastroenterology

## 2018-09-27 NOTE — Telephone Encounter (Signed)
See lab results for additional details.  

## 2018-09-30 ENCOUNTER — Other Ambulatory Visit: Payer: Self-pay | Admitting: Pediatrics

## 2018-09-30 ENCOUNTER — Encounter (HOSPITAL_COMMUNITY)
Admission: RE | Admit: 2018-09-30 | Discharge: 2018-09-30 | Disposition: A | Discharge status: Home / Self Care | Payer: BLUE CROSS/BLUE SHIELD | Source: Ambulatory Visit | Attending: Gastroenterology | Admitting: Gastroenterology

## 2018-09-30 DIAGNOSIS — R9389 Abnormal findings on diagnostic imaging of other specified body structures: Secondary | ICD-10-CM | POA: Insufficient documentation

## 2018-09-30 DIAGNOSIS — R112 Nausea with vomiting, unspecified: Secondary | ICD-10-CM | POA: Diagnosis not present

## 2018-09-30 DIAGNOSIS — R748 Abnormal levels of other serum enzymes: Secondary | ICD-10-CM

## 2018-09-30 DIAGNOSIS — R1084 Generalized abdominal pain: Secondary | ICD-10-CM | POA: Diagnosis not present

## 2018-09-30 DIAGNOSIS — K7689 Other specified diseases of liver: Secondary | ICD-10-CM | POA: Insufficient documentation

## 2018-09-30 DIAGNOSIS — R1011 Right upper quadrant pain: Secondary | ICD-10-CM | POA: Diagnosis not present

## 2018-09-30 MED ORDER — TECHNETIUM TC 99M MEBROFENIN IV KIT
5.2000 | PACK | Freq: Once | INTRAVENOUS | Status: AC | PRN
  Administered 2018-09-30: 5.2 via INTRAVENOUS

## 2018-10-18 ENCOUNTER — Encounter: Payer: Self-pay | Admitting: Gastroenterology

## 2018-10-20 ENCOUNTER — Ambulatory Visit (INDEPENDENT_AMBULATORY_CARE_PROVIDER_SITE_OTHER): Payer: BLUE CROSS/BLUE SHIELD

## 2018-10-20 VITALS — BP 110/63 | HR 69 | Temp 97.8°F

## 2018-10-20 DIAGNOSIS — Z23 Encounter for immunization: Secondary | ICD-10-CM

## 2018-10-20 NOTE — Progress Notes (Signed)
Patient is here today for her 2nd Shingrix vaccine.  Patient tolerated injection well. MPulliam, CMA/RT(R)

## 2018-11-01 ENCOUNTER — Ambulatory Visit (AMBULATORY_SURGERY_CENTER): Payer: BLUE CROSS/BLUE SHIELD | Admitting: Gastroenterology

## 2018-11-01 ENCOUNTER — Encounter: Payer: Self-pay | Admitting: Gastroenterology

## 2018-11-01 VITALS — BP 111/86 | HR 57 | Temp 97.1°F | Resp 10 | Ht 67.0 in | Wt 148.0 lb

## 2018-11-01 DIAGNOSIS — K2970 Gastritis, unspecified, without bleeding: Secondary | ICD-10-CM | POA: Diagnosis not present

## 2018-11-01 DIAGNOSIS — K2980 Duodenitis without bleeding: Secondary | ICD-10-CM

## 2018-11-01 DIAGNOSIS — K2950 Unspecified chronic gastritis without bleeding: Secondary | ICD-10-CM | POA: Diagnosis not present

## 2018-11-01 DIAGNOSIS — R109 Unspecified abdominal pain: Secondary | ICD-10-CM

## 2018-11-01 DIAGNOSIS — K219 Gastro-esophageal reflux disease without esophagitis: Secondary | ICD-10-CM | POA: Diagnosis not present

## 2018-11-01 MED ORDER — SODIUM CHLORIDE 0.9 % IV SOLN: 500.0000 mL | Freq: Once | INTRAVENOUS | Status: DC

## 2018-11-01 NOTE — Progress Notes (Signed)
Called to room to assist during endoscopic procedure.  Patient ID and intended procedure confirmed with present staff. Received instructions for my participation in the procedure from the performing physician.  

## 2018-11-01 NOTE — Patient Instructions (Signed)
Await pathology results.  Avoid all NSAIDS - ibuprofen, naproxen, aspirin.  Continue present medications including Protonix 40mg  twice a day.  YOU HAD AN ENDOSCOPIC PROCEDURE TODAY AT [facility]:   Refer to the procedure report that was given to you for any specific questions about what was found during the examination.  If the procedure report does not answer your questions, please call your gastroenterologist to clarify.  If you requested that your care partner not be given the details of your procedure findings, then the procedure report has been included in a sealed envelope for you to review at your convenience later.  YOU SHOULD EXPECT: Some feelings of bloating in the abdomen. Passage of more gas than usual.  Walking can help get rid of the air that was put into your GI tract during the procedure and reduce the bloating. If you had a lower endoscopy (such as a colonoscopy or flexible sigmoidoscopy) you may notice spotting of blood in your stool or on the toilet paper. If you underwent a bowel prep for your procedure, you may not have a normal bowel movement for a few days.  Please Note:  You might notice some irritation and congestion in your nose or some drainage.  This is from the oxygen used during your procedure.  There is no need for concern and it should clear up in a day or so.  SYMPTOMS TO REPORT IMMEDIATELY:    Following upper endoscopy (EGD)  Vomiting of blood or coffee ground material  New chest pain or pain under the shoulder blades  Painful or persistently difficult swallowing  New shortness of breath  Fever of 100F or higher  Black, tarry-looking stools  For urgent or emergent issues, a gastroenterologist can be reached at any hour by calling [phone].   DIET:  We do recommend a small meal at first, but then you may proceed to your regular diet.  Drink plenty of fluids but you should avoid alcoholic beverages for 24 hours.  ACTIVITY:  You  should plan to take it easy for the rest of today and you should NOT DRIVE or use heavy machinery until tomorrow (because of the sedation medicines used during the test).    FOLLOW UP: Our staff will call the number listed on your records the next business day following your procedure to check on you and address any questions or concerns that you may have regarding the information given to you following your procedure. If we do not reach you, we will leave a message.  However, if you are feeling well and you are not experiencing any problems, there is no need to return our call.  We will assume that you have returned to your regular daily activities without incident.  If any biopsies were taken you will be contacted by phone or by letter within the next 1-3 weeks.  Please call us at [phone] if you have not heard about the biopsies in 3 weeks.    SIGNATURES/CONFIDENTIALITY: You and/or your care partner have signed paperwork which will be entered into your electronic medical record.  These signatures attest to the fact that that the information above on your After Visit Summary has been reviewed and is understood.  Full responsibility of the confidentiality of this discharge information lies with you and/or your care-partner.

## 2018-11-01 NOTE — Op Note (Signed)
[place] Patient Name: Felicia Good Procedure Date: 11/01/2018 3:39 PM MRN: [id number] Endoscopist: Thornton Park MD, MD Age: 58 Referring MD:  Date of Birth: [birth year] Gender: Female Account #: [id number] Procedure:                Upper GI endoscopy Indications:              Generalized abdominal pain Medicines:                See the Anesthesia note for documentation of the                            administered medications Procedure:                Pre-Anesthesia Assessment:                           - Prior to the procedure, a History and Physical                            was performed, and patient medications and                            allergies were reviewed. The patient's tolerance of                            previous anesthesia was also reviewed. The risks                            and benefits of the procedure and the sedation                            options and risks were discussed with the patient.                            All questions were answered, and informed consent                            was obtained. Prior Anticoagulants: The patient has                            taken no previous anticoagulant or antiplatelet                            agents. ASA Grade Assessment: II - A patient with                            mild systemic disease. After reviewing the risks                            and benefits, the patient was deemed in                            satisfactory condition to undergo the procedure.  After obtaining informed consent, the endoscope was                            passed under direct vision. Throughout the                            procedure, the patient's blood pressure, pulse, and                            oxygen saturations were monitored continuously. The                            Model GIF-[id number] scope was introduced                            through the mouth, and  advanced to the second part                            of duodenum. The upper GI endoscopy was                            accomplished without difficulty. The patient                            tolerated the procedure well. Scope In: Scope Out: Findings:                 The examined esophagus was normal.                           Multiple localized, small non-bleeding erosions                            were found in the gastric body. There were stigmata                            of recent bleeding. Biopsies were taken with a cold                            forceps for histology. Estimated blood loss was                            minimal.                           Diffuse moderately erythematous mucosa without                            active bleeding and with no stigmata of bleeding                            was found in the duodenal bulb. Biopsies were taken                            with a cold forceps for histology.  The exam was otherwise without abnormality. Complications:            No immediate complications. Estimated blood loss:                            None. Estimated Blood Loss:     Estimated blood loss was minimal. Impression:               - Normal esophagus.                           - Non-bleeding erosive gastropathy. Biopsied.                           - Erythematous duodenopathy. Biopsied.                           - The examination was otherwise normal. Recommendation:           - Await pathology results.                           - Continue present medications including 8 weeks of                            Protonix 40 mg BID                           - Avoid all NSAIDs                           - Return to the office to review these results. Thornton Park MD, MD 11/01/2018 3:51:53 PM This report has been signed electronically.

## 2018-11-01 NOTE — Progress Notes (Signed)
Report to PACU, RN, vss, BBS= Clear.  

## 2018-11-02 ENCOUNTER — Telehealth: Payer: Self-pay

## 2018-11-02 NOTE — Telephone Encounter (Signed)
Left message on answering machine. 

## 2018-11-02 NOTE — Telephone Encounter (Signed)
Second post procedure follow up call, no answer 

## 2018-11-13 ENCOUNTER — Encounter: Payer: Self-pay | Admitting: Gastroenterology

## 2018-11-28 ENCOUNTER — Telehealth: Payer: Self-pay | Admitting: Gastroenterology

## 2018-11-28 NOTE — Telephone Encounter (Signed)
Is it ok for patient to continue on dicyclomine?

## 2018-11-28 NOTE — Telephone Encounter (Signed)
I am delighted to hear that it's working. She may continue dicyclomine. She should return to the office as needed. Thanks.

## 2018-11-28 NOTE — Telephone Encounter (Signed)
Pt informed. She will call when she needs a refill. She will follow up prn, pt did express concerns about PCP visit but I informed her Dr. Tarri Glenn will be able to see any results of any testing Katy Danford orders. Patient expressed her gratitude towards Dr. Tarri Glenn helping her.

## 2018-12-13 ENCOUNTER — Telehealth: Payer: Self-pay | Admitting: Gastroenterology

## 2018-12-13 MED ORDER — DICYCLOMINE HCL 20 MG PO TABS: 20.0000 mg | ORAL_TABLET | Freq: Three times a day (TID) | ORAL | 1 refills | Status: DC

## 2018-12-13 NOTE — Telephone Encounter (Signed)
rx sent to pharmacy

## 2018-12-13 NOTE — Telephone Encounter (Signed)
Pt needs refill of dicyclomine.

## 2019-02-08 NOTE — Progress Notes (Signed)
Virtual Visit via Telephone Note  I connected with [name] on 02/08/19 at  8:45 AM EDT by telephone and verified that I am speaking with the correct person using two identifiers.   I discussed the limitations, risks, security and privacy concerns of performing an evaluation and management service by telephone and the availability of in person appointments. The staff discussed with the patient that there may be a patient responsible charge related to this service. The patient expressed understanding and agreed to proceed.   History of Present Illness: 08/16/2018 OV: 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 presents for 6 month f/u: GAD, Depression, HLD She reports stable mood, denies SI/HI She feels that her current medication regime is effective She has greatly daily walking the last 3 weeks, 20-30 mins in duration- great! She reports 98% improvement in abdominal pain and she has states "I have figured what not to eat"- followed by Dr. Dahlia Bailiff She estimates to take 1-2 tablets of Clonazepam 0.5mg  per month She denies EOTH use She reports home BP readings SBP 110 DBP 70 She estimates to drink 6-8 cups water/day and has been trying to follow heart healthy diet She states "I have had more time to myself during this Stay at Home order than ever and it has been great"  Patient Care Team    Relationship Specialty Notifications Start End  Felicia Grandchild, NP PCP - General Family Medicine  06/14/17   Sanjuana Kava, MD Referring Physician Obstetrics and Gynecology  06/14/17     Patient Active Problem List   Diagnosis Date Noted  . Generalized abdominal pain 09/08/2018  . Oral pharyngeal candidiasis 08/25/2017  . 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 Thrush, oral 07/23/2017  . Elevated liver function tests 07/15/2017  . Hyperlipidemia 07/15/2017  . Anxiety 07/15/2017  . Chronic sinusitis 06/14/2017  . Healthcare maintenance 06/14/2017  . PANIC DISORDER,NO AGORAPHOBIA 10/13/2010  . TOBACCO ABUSE 08/13/2008  . BACK PAIN 01/19/2008  . URTICARIA NOS 08/15/2007  . Depression 06/15/2007     Past Medical History:  Diagnosis Date  . Anxiety   . Depression   . IBS (irritable bowel syndrome)   . Migraine headache   . Mitral valve prolapse   . Panic attacks   . Rectal polyp   . RLS (restless legs syndrome)      Past Surgical History:  Procedure Laterality Date  . rectal polyp removed  1987   Local anesthesia used  . RECTAL POLYPECTOMY       Family History  Problem Relation Age of Onset  . Other Father        esophageal problems  . Heart failure Father   . Cirrhosis Father        Non alcoholic   . Stroke Father        Died in 67   . Hypertension Father   . Hypertension Mother   . Stroke Mother   . Dementia Mother   . Breast cancer Maternal Aunt   .  Colon cancer Neg Hx      Social History   Substance and Sexual Activity  Drug Use No     Social History   Substance and Sexual Activity  Alcohol Use Yes  . Alcohol/week: 0.0 standard drinks   Comment: occ     Social History   Tobacco Use  Smoking Status Current Every Day Smoker  . Packs/day: 0.25  . Years: 40.00  . Pack years: 10.00  . Types: Cigarettes  Smokeless Tobacco Never Used  Tobacco Comment   has cut back     Outpatient Encounter Medications as of 02/13/2019  Medication Sig  . Cholecalciferol (VITAMIN D3) 2000 units TABS Take 1 tablet  by mouth daily.  . citalopram (CELEXA) 40 MG tablet TAKE 1 TABLET BY MOUTH DAILY.  . clonazePAM (KLONOPIN) 0.5 MG tablet Take 1 tablet (0.5 mg total) by mouth at bedtime as needed for anxiety.  . cyclobenzaprine (FLEXERIL) 10 MG tablet Take 10 mg by mouth 3 (three) times daily as needed for muscle spasms.  Marland Kitchen dicyclomine (BENTYL) 20 MG tablet Take 1 tablet (20 mg total) by mouth 3 (three) times daily before meals.  . fluticasone (FLONASE) 50 MCG/ACT nasal spray Place 1 spray into both nostrils 2 (two) times daily as needed for allergies or rhinitis.  . pantoprazole (PROTONIX) 40 MG tablet Take 1 tablet (40 mg total) by mouth 2 (two) times daily before a meal.  . Prenatal Vit-Fe Fumarate-FA (PRENATAL VITAMIN PO) Take 1 tablet by mouth daily.  . TRIAMCINOLONE ACETONIDE EX 1 application Topical 2 times daily   No facility-administered encounter medications on file as of 02/13/2019.     Allergies: Patient has no known allergies.  There is no height or weight on file to calculate BMI.  Last menstrual period 11/26/2013.   Observations/Objective: No acute distress noted during the telephone   Assessment and Plan: Continue all medications as directed Continue to remain well hydrated and follow heart healthy diet Incorporate yoga and meditation into daily routine- will help with chronic pain and improve overall mental health Follow-up with GI as directed  Follow Up Instructions: Fasting lab appt next week CPE in 6 months  I discussed the assessment and treatment plan with the patient. The patient was provided an opportunity to ask questions and all were answered. The patient agreed with the plan and demonstrated an understanding of the instructions.   The patient was advised to call back or seek an in-person evaluation if the symptoms worsen or if the condition fails to improve as anticipated.  I provided 15 minutes of non-face-to-face time during this encounter.   Felicia Grandchild, NP

## 2019-02-13 ENCOUNTER — Ambulatory Visit (INDEPENDENT_AMBULATORY_CARE_PROVIDER_SITE_OTHER): Payer: BLUE CROSS/BLUE SHIELD | Admitting: Adult Health

## 2019-02-13 ENCOUNTER — Other Ambulatory Visit: Payer: Self-pay

## 2019-02-13 ENCOUNTER — Encounter: Payer: Self-pay | Admitting: Adult Health

## 2019-02-13 VITALS — BP 110/70 | Temp 98.7°F | Ht 67.75 in | Wt 150.0 lb

## 2019-02-13 DIAGNOSIS — E785 Hyperlipidemia, unspecified: Secondary | ICD-10-CM

## 2019-02-13 DIAGNOSIS — R945 Abnormal results of liver function studies: Secondary | ICD-10-CM

## 2019-02-13 DIAGNOSIS — R7989 Other specified abnormal findings of blood chemistry: Secondary | ICD-10-CM

## 2019-02-13 DIAGNOSIS — F329 Major depressive disorder, single episode, unspecified: Secondary | ICD-10-CM | POA: Diagnosis not present

## 2019-02-13 DIAGNOSIS — Z0000 Encounter for general adult medical examination without abnormal findings: Secondary | ICD-10-CM

## 2019-02-13 DIAGNOSIS — F32A Depression, unspecified: Secondary | ICD-10-CM

## 2019-02-13 DIAGNOSIS — F419 Anxiety disorder, unspecified: Secondary | ICD-10-CM

## 2019-02-13 MED ORDER — CLONAZEPAM 0.5 MG PO TABS: 0.5000 mg | ORAL_TABLET | Freq: Every evening | ORAL | 0 refills | Status: DC | PRN

## 2019-02-13 MED ORDER — CYCLOBENZAPRINE HCL 10 MG PO TABS: 10.0000 mg | ORAL_TABLET | Freq: Three times a day (TID) | ORAL | 0 refills | Status: DC | PRN

## 2019-02-13 MED ORDER — CITALOPRAM HYDROBROMIDE 40 MG PO TABS: 40.0000 mg | ORAL_TABLET | Freq: Every day | ORAL | 1 refills | Status: DC

## 2019-02-13 NOTE — Assessment & Plan Note (Signed)
Assessment and Plan: Continue all medications as directed Continue to remain well hydrated and follow heart healthy diet Incorporate yoga and meditation into daily routine- will help with chronic pain and improve overall mental health Follow-up with GI as directed  Follow Up Instructions: Fasting lab appt next week CPE in 6 months   I discussed the assessment and treatment plan with the patient. The patient was provided an opportunity to ask questions and all were answered. The patient agreed with the plan and demonstrated an understanding of the instructions.   The patient was advised to call back or seek an in-person evaluation if the symptoms worsen or if the condition fails to improve as anticipated.

## 2019-02-13 NOTE — Assessment & Plan Note (Signed)
Mood stable, continue Citalopram 40mg  QD

## 2019-02-13 NOTE — Assessment & Plan Note (Signed)
[place] Controlled Substance Database reviewed- no aberrancies noted Estimates to use 1-2 Clonazepam 0.5mg  per month

## 2019-02-13 NOTE — Assessment & Plan Note (Signed)
Fasting labs next week Continue regular exercise

## 2019-02-14 ENCOUNTER — Other Ambulatory Visit: Payer: Self-pay | Admitting: Gastroenterology

## 2019-02-20 ENCOUNTER — Other Ambulatory Visit: Payer: BLUE CROSS/BLUE SHIELD

## 2019-02-20 ENCOUNTER — Other Ambulatory Visit: Payer: Self-pay

## 2019-02-20 DIAGNOSIS — R945 Abnormal results of liver function studies: Secondary | ICD-10-CM

## 2019-02-20 DIAGNOSIS — E785 Hyperlipidemia, unspecified: Secondary | ICD-10-CM | POA: Diagnosis not present

## 2019-02-20 DIAGNOSIS — R7989 Other specified abnormal findings of blood chemistry: Secondary | ICD-10-CM

## 2019-02-20 DIAGNOSIS — Z0000 Encounter for general adult medical examination without abnormal findings: Secondary | ICD-10-CM | POA: Diagnosis not present

## 2019-02-21 ENCOUNTER — Other Ambulatory Visit: Payer: Self-pay | Admitting: Adult Health

## 2019-02-21 DIAGNOSIS — E7800 Pure hypercholesterolemia, unspecified: Secondary | ICD-10-CM

## 2019-02-21 DIAGNOSIS — Z79899 Other long term (current) drug therapy: Secondary | ICD-10-CM

## 2019-02-21 LAB — LIPID PANEL
Chol/HDL Ratio: 5.7 ratio — ABNORMAL HIGH (ref 0.0–4.4)
Cholesterol, Total: 312 mg/dL — ABNORMAL HIGH (ref 100–199)
HDL: 55 mg/dL (ref >39)
LDL Calculated: 222 mg/dL — ABNORMAL HIGH (ref 0–99)
Triglycerides: 174 mg/dL — ABNORMAL HIGH (ref 0–149)
VLDL Cholesterol Cal: 35 mg/dL (ref 5–40)

## 2019-02-21 LAB — COMPREHENSIVE METABOLIC PANEL
ALT: 27 IU/L (ref 0–32)
AST: 22 IU/L (ref 0–40)
Albumin/Globulin Ratio: 1.8 (ref 1.2–2.2)
Albumin: 4.6 g/dL (ref 3.8–4.9)
Alkaline Phosphatase: 73 IU/L (ref 39–117)
BUN/Creatinine Ratio: 25 — ABNORMAL HIGH (ref 9–23)
BUN: 22 mg/dL (ref 6–24)
Bilirubin Total: 0.3 mg/dL (ref 0.0–1.2)
CO2: 26 mmol/L (ref 20–29)
Calcium: 9.5 mg/dL (ref 8.7–10.2)
Chloride: 102 mmol/L (ref 96–106)
Creatinine, Ser: 0.88 mg/dL (ref 0.57–1.00)
GFR calc Af Amer: 83 mL/min/{1.73_m2} (ref >59)
GFR calc non Af Amer: 72 mL/min/{1.73_m2} (ref >59)
Globulin, Total: 2.5 g/dL (ref 1.5–4.5)
Glucose: 85 mg/dL (ref 65–99)
Potassium: 4.3 mmol/L (ref 3.5–5.2)
Sodium: 139 mmol/L (ref 134–144)
Total Protein: 7.1 g/dL (ref 6.0–8.5)

## 2019-02-21 LAB — HEMOGLOBIN A1C
Est. average glucose Bld gHb Est-mCnc: 114 mg/dL
Hgb A1c MFr Bld: 5.6 % (ref 4.8–5.6)

## 2019-02-21 LAB — CBC WITH DIFFERENTIAL/PLATELET
Basophils Absolute: 0.1 10*3/uL (ref 0.0–0.2)
Basos: 1 %
EOS (ABSOLUTE): 0.2 10*3/uL (ref 0.0–0.4)
Eos: 3 %
Hematocrit: 37.6 % (ref 34.0–46.6)
Hemoglobin: 13 g/dL (ref 11.1–15.9)
Immature Grans (Abs): 0 10*3/uL (ref 0.0–0.1)
Immature Granulocytes: 0 %
Lymphocytes Absolute: 2.3 10*3/uL (ref 0.7–3.1)
Lymphs: 37 %
MCH: 31.9 pg (ref 26.6–33.0)
MCHC: 34.6 g/dL (ref 31.5–35.7)
MCV: 92 fL (ref 79–97)
Monocytes Absolute: 0.4 10*3/uL (ref 0.1–0.9)
Monocytes: 7 %
Neutrophils Absolute: 3.3 10*3/uL (ref 1.4–7.0)
Neutrophils: 52 %
Platelets: 228 10*3/uL (ref 150–450)
RBC: 4.08 x10E6/uL (ref 3.77–5.28)
RDW: 12.8 % (ref 11.7–15.4)
WBC: 6.2 10*3/uL (ref 3.4–10.8)

## 2019-02-21 LAB — TSH: TSH: 3.7 u[IU]/mL (ref 0.450–4.500)

## 2019-02-21 MED ORDER — ATORVASTATIN CALCIUM 10 MG PO TABS: 10.0000 mg | ORAL_TABLET | Freq: Every day | ORAL | 1 refills | Status: DC

## 2019-04-04 ENCOUNTER — Other Ambulatory Visit (INDEPENDENT_AMBULATORY_CARE_PROVIDER_SITE_OTHER): Payer: BC Managed Care – PPO

## 2019-04-04 ENCOUNTER — Other Ambulatory Visit: Payer: Self-pay

## 2019-04-04 DIAGNOSIS — Z79899 Other long term (current) drug therapy: Secondary | ICD-10-CM

## 2019-04-04 DIAGNOSIS — E7800 Pure hypercholesterolemia, unspecified: Secondary | ICD-10-CM

## 2019-04-05 LAB — HEPATIC FUNCTION PANEL
ALT: 30 IU/L (ref 0–32)
AST: 29 IU/L (ref 0–40)
Albumin: 4.5 g/dL (ref 3.8–4.9)
Alkaline Phosphatase: 76 IU/L (ref 39–117)
Bilirubin Total: 0.3 mg/dL (ref 0.0–1.2)
Bilirubin, Direct: 0.09 mg/dL (ref 0.00–0.40)
Total Protein: 6.8 g/dL (ref 6.0–8.5)

## 2019-04-05 LAB — LIPID PANEL
Chol/HDL Ratio: 3.1 ratio (ref 0.0–4.4)
Cholesterol, Total: 165 mg/dL (ref 100–199)
HDL: 53 mg/dL (ref >39)
LDL Calculated: 89 mg/dL (ref 0–99)
Triglycerides: 117 mg/dL (ref 0–149)
VLDL Cholesterol Cal: 23 mg/dL (ref 5–40)

## 2019-05-08 DIAGNOSIS — Z01419 Encounter for gynecological examination (general) (routine) without abnormal findings: Secondary | ICD-10-CM | POA: Diagnosis not present

## 2019-05-08 DIAGNOSIS — Z1231 Encounter for screening mammogram for malignant neoplasm of breast: Secondary | ICD-10-CM | POA: Diagnosis not present

## 2019-05-08 DIAGNOSIS — Z87898 Personal history of other specified conditions: Secondary | ICD-10-CM | POA: Insufficient documentation

## 2019-05-08 DIAGNOSIS — Z6823 Body mass index (BMI) 23.0-23.9, adult: Secondary | ICD-10-CM | POA: Diagnosis not present

## 2019-05-08 DIAGNOSIS — Z124 Encounter for screening for malignant neoplasm of cervix: Secondary | ICD-10-CM | POA: Diagnosis not present

## 2019-05-08 LAB — HM PAP SMEAR

## 2019-05-08 LAB — HM MAMMOGRAPHY

## 2019-06-14 NOTE — Progress Notes (Deleted)
Subjective:    Patient ID: Felicia Good, female    DOB: 1961/04/23, 58 y.o.   MRN: [id number]  HPI: 08/16/2018 OV: Ms. Felicia Good presents for CPE She reports medication compliance, denies SE She report mood stable, denies thoughts of harming herself/others- on citalopram 40mg  QD She reports constant level of stress with caring for elderly mother (who lives with her) and is childcare for granddaughter while her daughter/son-in-law work She estimates to drink >80 oz water/day and reports "slipping on the diet the last few months" She remains active with house/yard work, yoga/mediatation She continues to use tobacco, pack will last 3 /day Declined smoking cessation today  02/13/2019 OV: Ms. Felicia Good presents for 6 month f/u: GAD, Depression, HLD She reports stable mood, denies SI/HI She feels that her current medication regime is effective She has greatly daily walking the last 3 weeks, 20-30 mins in duration- great! She reports 98% improvement in abdominal pain and she has states "I have figured what not to eat"- followed by Dr. Dahlia Bailiff She estimates to take 1-2 tablets of Clonazepam 0.5mg  per month She denies EOTH use She reports home BP readings SBP 110 DBP 70 She estimates to drink 6-8 cups water/day and has been trying to follow heart healthy diet She states "I have had more time to myself during this Stay at Home order than ever and it has been great"  06/15/2019 OV: Ms. Felicia Good is here for regular f/u:GAD, Depression, HLD  Patient Care Team    Relationship Specialty Notifications Start End  Esaw Grandchild, NP PCP - General Family Medicine  06/14/17   Sanjuana Kava, MD Referring Physician Obstetrics and Gynecology  06/14/17     Patient Active Problem List   Diagnosis Date Noted  . Generalized abdominal pain 09/08/2018  . Oral pharyngeal candidiasis 08/25/2017  . 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 Thrush, oral 07/23/2017  . Elevated liver function tests 07/15/2017  .  Hyperlipidemia 07/15/2017  . Anxiety 07/15/2017  . Chronic sinusitis 06/14/2017  . Healthcare maintenance 06/14/2017  . PANIC DISORDER,NO AGORAPHOBIA 10/13/2010  . TOBACCO ABUSE 08/13/2008  . BACK PAIN 01/19/2008  . URTICARIA NOS 08/15/2007  . Depression 06/15/2007     Past Medical History:  Diagnosis Date  . Anxiety   . Depression   . IBS (irritable bowel syndrome)   . Migraine headache   . Mitral valve prolapse   . Panic attacks   . Rectal polyp   . RLS (restless legs syndrome)      Past Surgical History:  Procedure Laterality Date  . rectal polyp removed  1987   Local anesthesia used  . RECTAL POLYPECTOMY       Family History  Problem Relation Age of Onset  . Other Father        esophageal problems  . Heart failure Father   . Cirrhosis Father        Non alcoholic   . Stroke Father        Died in 15   . Hypertension Father   . Hypertension Mother   . Stroke Mother   . Dementia Mother   . Breast cancer Maternal Aunt   . Colon cancer Neg Hx      Social History   Substance and Sexual Activity  Drug Use No     Social History   Substance and Sexual Activity  Alcohol Use Yes  . Alcohol/week: 0.0 standard drinks   Comment: occ     Social History   Tobacco  Use  Smoking Status Current Every Day Smoker  . Packs/day: 0.25  . Years: 40.00  . Pack years: 10.00  . Types: Cigarettes  Smokeless Tobacco Never Used  Tobacco Comment   has cut back     Outpatient Encounter Medications as of 06/15/2019  Medication Sig  . atorvastatin (LIPITOR) 10 MG tablet Take 1 tablet (10 mg total) by mouth daily.  . Cholecalciferol (VITAMIN D3) 2000 units TABS Take 1 tablet by mouth daily.  . citalopram (CELEXA) 40 MG tablet Take 1 tablet (40 mg total) by mouth daily.  . clonazePAM (KLONOPIN) 0.5 MG tablet Take 1 tablet (0.5 mg total) by mouth at bedtime as needed for anxiety.  . cyclobenzaprine (FLEXERIL) 10 MG tablet Take 1 tablet (10 mg total) by mouth 3  (three) times daily as needed for muscle spasms.  Marland Kitchen dicyclomine (BENTYL) 20 MG tablet Take 1 tablet (20 mg total) by mouth 3 (three) times daily before meals.  . fluticasone (FLONASE) 50 MCG/ACT nasal spray Place 1 spray into both nostrils 2 (two) times daily as needed for allergies or rhinitis.  . pantoprazole (PROTONIX) 40 MG tablet TAKE 1 TABLET BY MOUTH 2 TIMES DAILY BEFORE A MEAL.  Marland Kitchen Prenatal Vit-Fe Fumarate-FA (PRENATAL VITAMIN PO) Take 1 tablet by mouth daily.  . TRIAMCINOLONE ACETONIDE EX 1 application Topical 2 times daily   No facility-administered encounter medications on file as of 06/15/2019.     Allergies: Patient has no known allergies.  There is no height or weight on file to calculate BMI.  Last menstrual period 11/26/2013.    Review of Systems     Objective:   Physical Exam        Assessment & Plan:  No diagnosis found.  No problem-specific Assessment & Plan notes found for this encounter.    FOLLOW-UP:  No follow-ups on file.

## 2019-06-15 ENCOUNTER — Ambulatory Visit: Payer: BLUE CROSS/BLUE SHIELD | Admitting: Adult Health

## 2019-06-15 ENCOUNTER — Other Ambulatory Visit: Payer: Self-pay | Admitting: Gastroenterology

## 2019-08-14 ENCOUNTER — Ambulatory Visit: Payer: BLUE CROSS/BLUE SHIELD | Admitting: Adult Health

## 2019-08-21 ENCOUNTER — Other Ambulatory Visit: Payer: Self-pay | Admitting: Adult Health

## 2019-09-07 ENCOUNTER — Other Ambulatory Visit: Payer: Self-pay

## 2019-09-07 ENCOUNTER — Other Ambulatory Visit: Payer: BC Managed Care – PPO

## 2019-09-07 DIAGNOSIS — Z0000 Encounter for general adult medical examination without abnormal findings: Secondary | ICD-10-CM | POA: Diagnosis not present

## 2019-09-07 DIAGNOSIS — R7989 Other specified abnormal findings of blood chemistry: Secondary | ICD-10-CM | POA: Diagnosis not present

## 2019-09-07 DIAGNOSIS — E785 Hyperlipidemia, unspecified: Secondary | ICD-10-CM

## 2019-09-08 LAB — CBC WITH DIFFERENTIAL/PLATELET
Basophils Absolute: 0 10*3/uL (ref 0.0–0.2)
Basos: 1 %
EOS (ABSOLUTE): 0.3 10*3/uL (ref 0.0–0.4)
Eos: 6 %
Hematocrit: 38.3 % (ref 34.0–46.6)
Hemoglobin: 13.1 g/dL (ref 11.1–15.9)
Immature Grans (Abs): 0 10*3/uL (ref 0.0–0.1)
Immature Granulocytes: 0 %
Lymphocytes Absolute: 2.2 10*3/uL (ref 0.7–3.1)
Lymphs: 42 %
MCH: 32.3 pg (ref 26.6–33.0)
MCHC: 34.2 g/dL (ref 31.5–35.7)
MCV: 94 fL (ref 79–97)
Monocytes Absolute: 0.4 10*3/uL (ref 0.1–0.9)
Monocytes: 7 %
Neutrophils Absolute: 2.3 10*3/uL (ref 1.4–7.0)
Neutrophils: 44 %
Platelets: 211 10*3/uL (ref 150–450)
RBC: 4.06 x10E6/uL (ref 3.77–5.28)
RDW: 12.9 % (ref 11.7–15.4)
WBC: 5.2 10*3/uL (ref 3.4–10.8)

## 2019-09-08 LAB — COMPREHENSIVE METABOLIC PANEL
ALT: 29 IU/L (ref 0–32)
AST: 26 IU/L (ref 0–40)
Albumin/Globulin Ratio: 2 (ref 1.2–2.2)
Albumin: 4.5 g/dL (ref 3.8–4.9)
Alkaline Phosphatase: 85 IU/L (ref 39–117)
BUN/Creatinine Ratio: 20 (ref 9–23)
BUN: 16 mg/dL (ref 6–24)
Bilirubin Total: 0.4 mg/dL (ref 0.0–1.2)
CO2: 23 mmol/L (ref 20–29)
Calcium: 9.6 mg/dL (ref 8.7–10.2)
Chloride: 105 mmol/L (ref 96–106)
Creatinine, Ser: 0.79 mg/dL (ref 0.57–1.00)
GFR calc Af Amer: 95 mL/min/{1.73_m2} (ref >59)
GFR calc non Af Amer: 82 mL/min/{1.73_m2} (ref >59)
Globulin, Total: 2.3 g/dL (ref 1.5–4.5)
Glucose: 92 mg/dL (ref 65–99)
Potassium: 4.3 mmol/L (ref 3.5–5.2)
Sodium: 141 mmol/L (ref 134–144)
Total Protein: 6.8 g/dL (ref 6.0–8.5)

## 2019-09-08 LAB — LIPID PANEL
Chol/HDL Ratio: 2.6 ratio (ref 0.0–4.4)
Cholesterol, Total: 174 mg/dL (ref 100–199)
HDL: 67 mg/dL (ref >39)
LDL Chol Calc (NIH): 94 mg/dL (ref 0–99)
Triglycerides: 66 mg/dL (ref 0–149)
VLDL Cholesterol Cal: 13 mg/dL (ref 5–40)

## 2019-09-08 LAB — HEMOGLOBIN A1C
Est. average glucose Bld gHb Est-mCnc: 114 mg/dL
Hgb A1c MFr Bld: 5.6 % (ref 4.8–5.6)

## 2019-09-08 LAB — TSH: TSH: 2.18 u[IU]/mL (ref 0.450–4.500)

## 2019-09-08 NOTE — Progress Notes (Signed)
Subjective:    Patient ID: Felicia Good, female    DOB: July 26, 1961, 58 y.o.   MRN: [id number]  HPI:  Felicia Good is here for CPE She reports stable mood, denies SI/HI She is on  Citalopram 40mg  QD PDMP reviewed- last clonazepam 0.5mg  last filled on 02/13/2019, 30 count- she estimates to use 1-2 tablets per month- requests refill She walks 2-3 times per week, 30-45 minutes in duration She continues to use tobacco- 1/4 pack per day-declined smoking cessation. Constipation continues to be an issue- encouraged to f/u with her established GI  09/07/2019 Labs: A1c-WNL, 5.6- just under pre-diabetes  TSH-WNL, 2.180  CMP-stable  CBC-stable  The 10-year ASCVD risk score Felicia Bussing DC Jr., et al., 2013) is: 2.5%  Values used to calculate the score:   Age: 44 years   Sex: Female   Is Non-Hispanic African American: No   Diabetic: No   Tobacco smoker: Yes   Systolic Blood Pressure: 90 mmHg   Is BP treated: No   HDL Cholesterol: 67 mg/dL   Total Cholesterol: 174 mg/dL  LDL-94  LDL-222 six months ago!   Healthcare Maintenance: PAP-04/2019- Dr. Alwyn Pea- tracking down Mammogram-04/2019 -Dr. Alwyn Pea- tracking down Colonoscopy-02/2015- repeat in 10 years  Patient Care Team    Relationship Specialty Notifications Start End  Esaw Grandchild, NP PCP - General Family Medicine  06/14/17   Sanjuana Kava, MD Referring Physician Obstetrics and Gynecology  06/14/17     Patient Active Problem List   Diagnosis Date Noted  . Generalized abdominal pain 09/08/2018  . Oral pharyngeal candidiasis 08/25/2017  . 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 Thrush, oral 07/23/2017  . Elevated liver function tests 07/15/2017  . Hyperlipidemia 07/15/2017  . Anxiety 07/15/2017  . Chronic sinusitis 06/14/2017  . Healthcare maintenance 06/14/2017  . PANIC DISORDER,NO AGORAPHOBIA 10/13/2010  . TOBACCO ABUSE 08/13/2008  . BACK PAIN 01/19/2008  . URTICARIA NOS 08/15/2007  . Depression 06/15/2007      Past Medical History:  Diagnosis Date  . Anxiety   . Depression   . IBS (irritable bowel syndrome)   . Migraine headache   . Mitral valve prolapse   . Panic attacks   . Rectal polyp   . RLS (restless legs syndrome)      Past Surgical History:  Procedure Laterality Date  . rectal polyp removed  1987   Local anesthesia used  . RECTAL POLYPECTOMY       Family History  Problem Relation Age of Onset  . Other Father        esophageal problems  . Heart failure Father   . Cirrhosis Father        Non alcoholic   . Stroke Father        Died in 1   . Hypertension Father   . Hypertension Mother   . Stroke Mother   . Dementia Mother   . Breast cancer Maternal Aunt   . Colon cancer Neg Hx      Social History   Substance and Sexual Activity  Drug Use No     Social History   Substance and Sexual Activity  Alcohol Use Yes  . Alcohol/week: 0.0 standard drinks   Comment: occ     Social History   Tobacco Use  Smoking Status Current Every Day Smoker  . Packs/day: 0.25  . Years: 40.00  . Pack years: 10.00  . Types: Cigarettes  Smokeless Tobacco Never Used  Tobacco Comment   has cut back  Outpatient Encounter Medications as of 09/11/2019  Medication Sig  . atorvastatin (LIPITOR) 10 MG tablet Take 1 tablet (10 mg total) by mouth daily.  . Cholecalciferol (VITAMIN D3) 2000 units TABS Take 1 tablet by mouth daily.  . citalopram (CELEXA) 40 MG tablet Take 1 tablet (40 mg total) by mouth daily.  . clonazePAM (KLONOPIN) 0.5 MG tablet Take 1 tablet (0.5 mg total) by mouth at bedtime as needed for anxiety.  . cyclobenzaprine (FLEXERIL) 10 MG tablet Take 1 tablet (10 mg total) by mouth 3 (three) times daily as needed for muscle spasms.  Marland Kitchen dicyclomine (BENTYL) 20 MG tablet TAKE 1 TABLET BY MOUTH 3 TIMES A DAY BEFORE MEALS  . fluticasone (FLONASE) 50 MCG/ACT nasal spray Place 1 spray into both nostrils 2 (two) times daily as needed for allergies or rhinitis.   . pantoprazole (PROTONIX) 40 MG tablet TAKE 1 TABLET BY MOUTH 2 TIMES DAILY BEFORE A MEAL.  Marland Kitchen Prenatal Vit-Fe Fumarate-FA (PRENATAL VITAMIN PO) Take 1 tablet by mouth daily.  . TRIAMCINOLONE ACETONIDE EX 1 application Topical 2 times daily  . [DISCONTINUED] atorvastatin (LIPITOR) 10 MG tablet TAKE 1 TABLET BY MOUTH DAILY.  . [DISCONTINUED] citalopram (CELEXA) 40 MG tablet Take 1 tablet (40 mg total) by mouth daily.  . [DISCONTINUED] clonazePAM (KLONOPIN) 0.5 MG tablet Take 1 tablet (0.5 mg total) by mouth at bedtime as needed for anxiety.  . [DISCONTINUED] cyclobenzaprine (FLEXERIL) 10 MG tablet Take 1 tablet (10 mg total) by mouth 3 (three) times daily as needed for muscle spasms.   No facility-administered encounter medications on file as of 09/11/2019.     Allergies: Patient has no known allergies.  Body mass index is 23.44 kg/m.  Blood pressure 95/66, pulse 86, temperature 98.8 F (37.1 C), temperature source Oral, height 5' 7.75" (1.721 m), weight 153 lb (69.4 kg), last menstrual period 11/26/2013, SpO2 98 %.     Review of Systems  Constitutional: Positive for fatigue. Negative for activity change, appetite change, chills, diaphoresis, fever and unexpected weight change.  HENT: Negative for congestion.   Eyes: Negative for visual disturbance.  Respiratory: Negative for cough, chest tightness, shortness of breath, wheezing and stridor.   Cardiovascular: Negative for chest pain, palpitations and leg swelling.  Gastrointestinal: Negative for abdominal distention, anal bleeding, blood in stool, constipation, diarrhea, nausea and vomiting.  Endocrine: Negative for polydipsia, polyphagia and polyuria.  Genitourinary: Negative for difficulty urinating and flank pain.  Musculoskeletal: Negative for arthralgias, back pain, gait problem, joint swelling, myalgias, neck pain and neck stiffness.  Skin: Negative for color change, pallor, rash and wound.  Neurological: Negative for  dizziness and headaches.  Hematological: Negative for adenopathy. Does not bruise/bleed easily.  Psychiatric/Behavioral: Negative for agitation, behavioral problems, confusion, decreased concentration, dysphoric mood, hallucinations, self-injury, sleep disturbance and suicidal ideas. The patient is nervous/anxious. The patient is not hyperactive.        Objective:   Physical Exam Vitals signs and nursing note reviewed.  Constitutional:      General: She is not in acute distress.    Appearance: Normal appearance. She is normal weight. She is not ill-appearing, toxic-appearing or diaphoretic.  HENT:     Head: Normocephalic and atraumatic.     Right Ear: Tympanic membrane, ear canal and external ear normal. There is no impacted cerumen.     Left Ear: Tympanic membrane, ear canal and external ear normal. There is no impacted cerumen.     Nose: Nose normal. No congestion.     Mouth/Throat:  Mouth: Mucous membranes are moist.     Pharynx: No oropharyngeal exudate.  Eyes:     Extraocular Movements: Extraocular movements intact.     Conjunctiva/sclera: Conjunctivae normal.     Pupils: Pupils are equal, round, and reactive to light.  Neck:     Musculoskeletal: Normal range of motion and neck supple.  Cardiovascular:     Rate and Rhythm: Normal rate and regular rhythm.     Pulses: Normal pulses.     Heart sounds: Normal heart sounds. No murmur. No friction rub. No gallop.   Pulmonary:     Effort: Pulmonary effort is normal. No respiratory distress.     Breath sounds: Normal breath sounds. No stridor. No wheezing, rhonchi or rales.  Chest:     Chest wall: No tenderness.  Abdominal:     General: Abdomen is flat. Bowel sounds are normal.     Palpations: Abdomen is soft.     Tenderness: There is no abdominal tenderness. There is no right CVA tenderness or left CVA tenderness.  Musculoskeletal: Normal range of motion.  Skin:    General: Skin is warm and dry.     Capillary Refill:  Capillary refill takes less than 2 seconds.     Findings: No erythema.  Neurological:     Mental Status: She is alert and oriented to person, place, and time.  Psychiatric:        Mood and Affect: Mood normal.        Behavior: Behavior normal.        Thought Content: Thought content normal.        Judgment: Judgment normal.        Assessment & Plan:   1. Need for influenza vaccination   2. Blood glucose elevated   3. Healthcare maintenance   4. Hyperlipidemia, unspecified hyperlipidemia type   5. Elevated liver function tests     Healthcare maintenance Continue all medications as directed. Remain well hydrated with water, follow heart healthy diet. Remain as active as possible. Recommend re-checking A1c (average of your blood sugar over the last 3 months) in 6 months- continue to reduce your carbohydrate intake. GREAT reduction in your cholesterol panel! Continue to social distance and wear a mask when in public. Follow-up in 6 months.  Hyperlipidemia The 10-year ASCVD risk score Felicia Bussing DC Jr., et al., 2013) is: 2.8%   Values used to calculate the score:     Age: 57 years     Sex: Female     Is Non-Hispanic African American: No     Diabetic: No     Tobacco smoker: Yes     Systolic Blood Pressure: 95 mmHg     Is BP treated: No     HDL Cholesterol: 67 mg/dL     Total Cholesterol: 174 mg/dL  LDL-94, down from 222 Continue Atorvastatin 10mg  QD LFTs-normal   Elevated liver function tests 09/07/2019 CMP normal     FOLLOW-UP:  Return in about 6 months (around 03/10/2020) for Regular Follow Up, Hypercholestermia, Depression.

## 2019-09-11 ENCOUNTER — Encounter: Payer: Self-pay | Admitting: Adult Health

## 2019-09-11 ENCOUNTER — Ambulatory Visit (INDEPENDENT_AMBULATORY_CARE_PROVIDER_SITE_OTHER): Payer: BC Managed Care – PPO | Admitting: Adult Health

## 2019-09-11 ENCOUNTER — Other Ambulatory Visit: Payer: Self-pay

## 2019-09-11 VITALS — BP 95/66 | HR 86 | Temp 98.8°F | Ht 67.75 in | Wt 153.0 lb

## 2019-09-11 DIAGNOSIS — Z23 Encounter for immunization: Secondary | ICD-10-CM

## 2019-09-11 DIAGNOSIS — R739 Hyperglycemia, unspecified: Secondary | ICD-10-CM | POA: Diagnosis not present

## 2019-09-11 DIAGNOSIS — F32A Depression, unspecified: Secondary | ICD-10-CM

## 2019-09-11 DIAGNOSIS — Z0000 Encounter for general adult medical examination without abnormal findings: Secondary | ICD-10-CM | POA: Diagnosis not present

## 2019-09-11 DIAGNOSIS — F329 Major depressive disorder, single episode, unspecified: Secondary | ICD-10-CM

## 2019-09-11 DIAGNOSIS — E785 Hyperlipidemia, unspecified: Secondary | ICD-10-CM | POA: Diagnosis not present

## 2019-09-11 DIAGNOSIS — F419 Anxiety disorder, unspecified: Secondary | ICD-10-CM

## 2019-09-11 DIAGNOSIS — R7989 Other specified abnormal findings of blood chemistry: Secondary | ICD-10-CM

## 2019-09-11 MED ORDER — CYCLOBENZAPRINE HCL 10 MG PO TABS: 10.0000 mg | ORAL_TABLET | Freq: Three times a day (TID) | ORAL | 0 refills | Status: DC | PRN

## 2019-09-11 MED ORDER — CITALOPRAM HYDROBROMIDE 40 MG PO TABS: 40.0000 mg | ORAL_TABLET | Freq: Every day | ORAL | 1 refills | Status: DC

## 2019-09-11 MED ORDER — CLONAZEPAM 0.5 MG PO TABS: 0.5000 mg | ORAL_TABLET | Freq: Every evening | ORAL | 0 refills | Status: DC | PRN

## 2019-09-11 MED ORDER — ATORVASTATIN CALCIUM 10 MG PO TABS: 10.0000 mg | ORAL_TABLET | Freq: Every day | ORAL | 0 refills | Status: DC

## 2019-09-11 NOTE — Assessment & Plan Note (Addendum)
09/07/2019 CMP normal

## 2019-09-11 NOTE — Assessment & Plan Note (Signed)
She reports stable mood, denies SI/HI She is on  Citalopram 40mg  QD PDMP reviewed- last clonazepam 0.5mg  last filled on 02/13/2019, 30 count- she estimates to use 1-2 tablets per month- requests refill

## 2019-09-11 NOTE — Patient Instructions (Addendum)
Preventive Care for Adults, Female  A healthy lifestyle and preventive care can promote health and wellness. Preventive health guidelines for women include the following key practices.   A routine yearly physical is a good way to check with your health care provider about your health and preventive screening. It is a chance to share any concerns and updates on your health and to receive a thorough exam.   Visit your dentist for a routine exam and preventive care every 6 months. Brush your teeth twice a day and floss once a day. Good oral hygiene prevents tooth decay and gum disease.   The frequency of eye exams is based on your age, health, family medical history, use of contact lenses, and other factors. Follow your health care provider's recommendations for frequency of eye exams.   Eat a healthy diet. Foods like vegetables, fruits, whole grains, low-fat dairy products, and lean protein foods contain the nutrients you need without too many calories. Decrease your intake of foods high in solid fats, added sugars, and salt. Eat the right amount of calories for you.Get information about a proper diet from your health care provider, if necessary.   Regular physical exercise is one of the most important things you can do for your health. Most adults should get at least 150 minutes of moderate-intensity exercise (any activity that increases your heart rate and causes you to sweat) each week. In addition, most adults need muscle-strengthening exercises on 2 or more days a week.   Maintain a healthy weight. The body mass index (BMI) is a screening tool to identify possible weight problems. It provides an estimate of body fat based on height and weight. Your health care provider can find your BMI, and can help you achieve or maintain a healthy weight.For adults 20 years and older:   - A BMI below 18.5 is considered underweight.   - A BMI of 18.5 to 24.9 is normal.   - A BMI of 25 to 29.9 is  considered overweight.   - A BMI of 30 and above is considered obese.   Maintain normal blood lipids and cholesterol levels by exercising and minimizing your intake of trans and saturated fats.  Eat a balanced diet with plenty of fruit and vegetables. Blood tests for lipids and cholesterol should begin at age 20 and be repeated every 5 years minimum.  If your lipid or cholesterol levels are high, you are over 40, or you are at high risk for heart disease, you may need your cholesterol levels checked more frequently.Ongoing high lipid and cholesterol levels should be treated with medicines if diet and exercise are not working.   If you smoke, find out from your health care provider how to quit. If you do not use tobacco, do not start.   Lung cancer screening is recommended for adults aged 55-80 years who are at high risk for developing lung cancer because of a history of smoking. A yearly low-dose CT scan of the lungs is recommended for people who have at least a 30-pack-year history of smoking and are a current smoker or have quit within the past 15 years. A pack year of smoking is smoking an average of 1 pack of cigarettes a day for 1 year (for example: 1 pack a day for 30 years or 2 packs a day for 15 years). Yearly screening should continue until the smoker has stopped smoking for at least 15 years. Yearly screening should be stopped for people who develop a   health problem that would prevent them from having lung cancer treatment.   If you are pregnant, do not drink alcohol. If you are breastfeeding, be very cautious about drinking alcohol. If you are not pregnant and choose to drink alcohol, do not have more than 1 drink per day. One drink is considered to be 12 ounces (355 mL) of beer, 5 ounces (148 mL) of wine, or 1.5 ounces (44 mL) of liquor.   Avoid use of street drugs. Do not share needles with anyone. Ask for help if you need support or instructions about stopping the use of  drugs.   High blood pressure causes heart disease and increases the risk of stroke. Your blood pressure should be checked at least yearly.  Ongoing high blood pressure should be treated with medicines if weight loss and exercise do not work.   If you are 69-55 years old, ask your health care provider if you should take aspirin to prevent strokes.   Diabetes screening involves taking a blood sample to check your fasting blood sugar level. This should be done once every 3 years, after age 38, if you are within normal weight and without risk factors for diabetes. Testing should be considered at a younger age or be carried out more frequently if you are overweight and have at least 1 risk factor for diabetes.   Breast cancer screening is essential preventive care for women. You should practice "breast self-awareness."  This means understanding the normal appearance and feel of your breasts and may include breast self-examination.  Any changes detected, no matter how small, should be reported to a health care provider.  Women in their 80s and 30s should have a clinical breast exam (CBE) by a health care provider as part of a regular health exam every 1 to 3 years.  After age 66, women should have a CBE every year.  Starting at age 1, women should consider having a mammogram (breast X-ray test) every year.  Women who have a family history of breast cancer should talk to their health care provider about genetic screening.  Women at a high risk of breast cancer should talk to their health care providers about having an MRI and a mammogram every year.   -Breast cancer gene (BRCA)-related cancer risk assessment is recommended for women who have family members with BRCA-related cancers. BRCA-related cancers include breast, ovarian, tubal, and peritoneal cancers. Having family members with these cancers may be associated with an increased risk for harmful changes (mutations) in the breast cancer genes BRCA1 and  BRCA2. Results of the assessment will determine the need for genetic counseling and BRCA1 and BRCA2 testing.   The Pap test is a screening test for cervical cancer. A Pap test can show cell changes on the cervix that might become cervical cancer if left untreated. A Pap test is a procedure in which cells are obtained and examined from the lower end of the uterus (cervix).   - Women should have a Pap test starting at age 57.   - Between ages 90 and 70, Pap tests should be repeated every 2 years.   - Beginning at age 63, you should have a Pap test every 3 years as long as the past 3 Pap tests have been normal.   - Some women have medical problems that increase the chance of getting cervical cancer. Talk to your health care provider about these problems. It is especially important to talk to your health care provider if a  new problem develops soon after your last Pap test. In these cases, your health care provider may recommend more frequent screening and Pap tests.   - The above recommendations are the same for women who have or have not gotten the vaccine for human papillomavirus (HPV).   - If you had a hysterectomy for a problem that was not cancer or a condition that could lead to cancer, then you no longer need Pap tests. Even if you no longer need a Pap test, a regular exam is a good idea to make sure no other problems are starting.   - If you are between ages 36 and 66 years, and you have had normal Pap tests going back 10 years, you no longer need Pap tests. Even if you no longer need a Pap test, a regular exam is a good idea to make sure no other problems are starting.   - If you have had past treatment for cervical cancer or a condition that could lead to cancer, you need Pap tests and screening for cancer for at least 20 years after your treatment.   - If Pap tests have been discontinued, risk factors (such as a new sexual partner) need to be reassessed to determine if screening should  be resumed.   - The HPV test is an additional test that may be used for cervical cancer screening. The HPV test looks for the virus that can cause the cell changes on the cervix. The cells collected during the Pap test can be tested for HPV. The HPV test could be used to screen women aged 70 years and older, and should be used in women of any age who have unclear Pap test results. After the age of 67, women should have HPV testing at the same frequency as a Pap test.   Colorectal cancer can be detected and often prevented. Most routine colorectal cancer screening begins at the age of 57 years and continues through age 26 years. However, your health care provider may recommend screening at an earlier age if you have risk factors for colon cancer. On a yearly basis, your health care provider may provide home test kits to check for hidden blood in the stool.  Use of a small camera at the end of a tube, to directly examine the colon (sigmoidoscopy or colonoscopy), can detect the earliest forms of colorectal cancer. Talk to your health care provider about this at age 23, when routine screening begins. Direct exam of the colon should be repeated every 5 -10 years through age 49 years, unless early forms of pre-cancerous polyps or small growths are found.   People who are at an increased risk for hepatitis B should be screened for this virus. You are considered at high risk for hepatitis B if:  -You were born in a country where hepatitis B occurs often. Talk with your health care provider about which countries are considered high risk.  - Your parents were born in a high-risk country and you have not received a shot to protect against hepatitis B (hepatitis B vaccine).  - You have HIV or AIDS.  - You use needles to inject street drugs.  - You live with, or have sex with, someone who has Hepatitis B.  - You get hemodialysis treatment.  - You take certain medicines for conditions like cancer, organ  transplantation, and autoimmune conditions.   Hepatitis C blood testing is recommended for all people born from [birth year] through 1965 and any individual  with known risks for hepatitis C.   Practice safe sex. Use condoms and avoid high-risk sexual practices to reduce the spread of sexually transmitted infections (STIs). STIs include gonorrhea, chlamydia, syphilis, trichomonas, herpes, HPV, and human immunodeficiency virus (HIV). Herpes, HIV, and HPV are viral illnesses that have no cure. They can result in disability, cancer, and death. Sexually active women aged 25 years and younger should be checked for chlamydia. Older women with new or multiple partners should also be tested for chlamydia. Testing for other STIs is recommended if you are sexually active and at increased risk.   Osteoporosis is a disease in which the bones lose minerals and strength with aging. This can result in serious bone fractures or breaks. The risk of osteoporosis can be identified using a bone density scan. Women ages 65 years and over and women at risk for fractures or osteoporosis should discuss screening with their health care providers. Ask your health care provider whether you should take a calcium supplement or vitamin D to There are also several preventive steps women can take to avoid osteoporosis and resulting fractures or to keep osteoporosis from worsening. -->Recommendations include:  Eat a balanced diet high in fruits, vegetables, calcium, and vitamins.  Get enough calcium. The recommended total intake of is 1,200 mg daily; for best absorption, if taking supplements, divide doses into 250-500 mg doses throughout the day. Of the two types of calcium, calcium carbonate is best absorbed when taken with food but calcium citrate can be taken on an empty stomach.  Get enough vitamin D. NAMS and the National Osteoporosis Foundation recommend at least 1,000 IU per day for women age 50 and over who are at risk of vitamin D  deficiency. Vitamin D deficiency can be caused by inadequate sun exposure (for example, those who live in northern latitudes).  Avoid alcohol and smoking. Heavy alcohol intake (more than 7 drinks per week) increases the risk of falls and hip fracture and women smokers tend to lose bone more rapidly and have lower bone mass than nonsmokers. Stopping smoking is one of the most important changes women can make to improve their health and decrease risk for disease.  Be physically active every day. Weight-bearing exercise (for example, fast walking, hiking, jogging, and weight training) may strengthen bones or slow the rate of bone loss that comes with aging. Balancing and muscle-strengthening exercises can reduce the risk of falling and fracture.  Consider therapeutic medications. Currently, several types of effective drugs are available. Healthcare providers can recommend the type most appropriate for each woman.  Eliminate environmental factors that may contribute to accidents. Falls cause nearly 90% of all osteoporotic fractures, so reducing this risk is an important bone-health strategy. Measures include ample lighting, removing obstructions to walking, using nonskid rugs on floors, and placing mats and/or grab bars in showers.  Be aware of medication side effects. Some common medicines make bones weaker. These include a type of steroid drug called glucocorticoids used for arthritis and asthma, some antiseizure drugs, certain sleeping pills, treatments for endometriosis, and some cancer drugs. An overactive thyroid gland or using too much thyroid hormone for an underactive thyroid can also be a problem. If you are taking these medicines, talk to your doctor about what you can do to help protect your bones.reduce the rate of osteoporosis.    Menopause can be associated with physical symptoms and risks. Hormone replacement therapy is available to decrease symptoms and risks. You should talk to your  health care provider   about whether hormone replacement therapy is right for you.   Use sunscreen. Apply sunscreen liberally and repeatedly throughout the day. You should seek shade when your shadow is shorter than you. Protect yourself by wearing long sleeves, pants, a wide-brimmed hat, and sunglasses year round, whenever you are outdoors.   Once a month, do a whole body skin exam, using a mirror to look at the skin on your back. Tell your health care provider of new moles, moles that have irregular borders, moles that are larger than a pencil eraser, or moles that have changed in shape or color.   -Stay current with required vaccines (immunizations).   Influenza vaccine. All adults should be immunized every year.  Tetanus, diphtheria, and acellular pertussis (Td, Tdap) vaccine. Pregnant women should receive 1 dose of Tdap vaccine during each pregnancy. The dose should be obtained regardless of the length of time since the last dose. Immunization is preferred during the 27th 36th week of gestation. An adult who has not previously received Tdap or who does not know her vaccine status should receive 1 dose of Tdap. This initial dose should be followed by tetanus and diphtheria toxoids (Td) booster doses every 10 years. Adults with an unknown or incomplete history of completing a 3-dose immunization series with Td-containing vaccines should begin or complete a primary immunization series including a Tdap dose. Adults should receive a Td booster every 10 years.  Varicella vaccine. An adult without evidence of immunity to varicella should receive 2 doses or a second dose if she has previously received 1 dose. Pregnant females who do not have evidence of immunity should receive the first dose after pregnancy. This first dose should be obtained before leaving the health care facility. The second dose should be obtained 4 8 weeks after the first dose.  Human papillomavirus (HPV) vaccine. Females aged 13 26  years who have not received the vaccine previously should obtain the 3-dose series. The vaccine is not recommended for use in pregnant females. However, pregnancy testing is not needed before receiving a dose. If a female is found to be pregnant after receiving a dose, no treatment is needed. In that case, the remaining doses should be delayed until after the pregnancy. Immunization is recommended for any person with an immunocompromised condition through the age of 26 years if she did not get any or all doses earlier. During the 3-dose series, the second dose should be obtained 4 8 weeks after the first dose. The third dose should be obtained 24 weeks after the first dose and 16 weeks after the second dose.  Zoster vaccine. One dose is recommended for adults aged 60 years or older unless certain conditions are present.  Measles, mumps, and rubella (MMR) vaccine. Adults born before 1957 generally are considered immune to measles and mumps. Adults born in 1957 or later should have 1 or more doses of MMR vaccine unless there is a contraindication to the vaccine or there is laboratory evidence of immunity to each of the three diseases. A routine second dose of MMR vaccine should be obtained at least 28 days after the first dose for students attending postsecondary schools, health care workers, or international travelers. People who received inactivated measles vaccine or an unknown type of measles vaccine during 1963 1967 should receive 2 doses of MMR vaccine. People who received inactivated mumps vaccine or an unknown type of mumps vaccine before 1979 and are at high risk for mumps infection should consider immunization with 2 doses of   MMR vaccine. For females of childbearing age, rubella immunity should be determined. If there is no evidence of immunity, females who are not pregnant should be vaccinated. If there is no evidence of immunity, females who are pregnant should delay immunization until after pregnancy.  Unvaccinated health care workers born before [birth year] who lack laboratory evidence of measles, mumps, or rubella immunity or laboratory confirmation of disease should consider measles and mumps immunization with 2 doses of MMR vaccine or rubella immunization with 1 dose of MMR vaccine.  Pneumococcal 13-valent conjugate (PCV13) vaccine. When indicated, a person who is uncertain of her immunization history and has no record of immunization should receive the PCV13 vaccine. An adult aged 54 years or older who has certain medical conditions and has not been previously immunized should receive 1 dose of PCV13 vaccine. This PCV13 should be followed with a dose of pneumococcal polysaccharide (PPSV23) vaccine. The PPSV23 vaccine dose should be obtained at least 8 weeks after the dose of PCV13 vaccine. An adult aged 58 years or older who has certain medical conditions and previously received 1 or more doses of PPSV23 vaccine should receive 1 dose of PCV13. The PCV13 vaccine dose should be obtained 1 or more years after the last PPSV23 vaccine dose.  Pneumococcal polysaccharide (PPSV23) vaccine. When PCV13 is also indicated, PCV13 should be obtained first. All adults aged 58 years and older should be immunized. An adult younger than age 65 years who has certain medical conditions should be immunized. Any person who resides in a nursing home or long-term care facility should be immunized. An adult smoker should be immunized. People with an immunocompromised condition and certain other conditions should receive both PCV13 and PPSV23 vaccines. People with human immunodeficiency virus (HIV) infection should be immunized as soon as possible after diagnosis. Immunization during chemotherapy or radiation therapy should be avoided. Routine use of PPSV23 vaccine is not recommended for American Indians, [place] Natives, or people younger than 65 years unless there are medical conditions that require PPSV23 vaccine. When indicated,  people who have unknown immunization and have no record of immunization should receive PPSV23 vaccine. One-time revaccination 5 years after the first dose of PPSV23 is recommended for people aged 70 64 years who have chronic kidney failure, nephrotic syndrome, asplenia, or immunocompromised conditions. People who received 1 2 doses of PPSV23 before age 32 years should receive another dose of PPSV23 vaccine at age 96 years or later if at least 5 years have passed since the previous dose. Doses of PPSV23 are not needed for people immunized with PPSV23 at or after age 55 years.  Meningococcal vaccine. Adults with asplenia or persistent complement component deficiencies should receive 2 doses of quadrivalent meningococcal conjugate (MenACWY-D) vaccine. The doses should be obtained at least 2 months apart. Microbiologists working with certain meningococcal bacteria, [employer] recruits, people at risk during an outbreak, and people who travel to or live in countries with a high rate of meningitis should be immunized. A first-year college student up through age 58 years who is living in a residence hall should receive a dose if she did not receive a dose on or after her 16th birthday. Adults who have certain high-risk conditions should receive one or more doses of vaccine.  Hepatitis A vaccine. Adults who wish to be protected from this disease, have certain high-risk conditions, work with hepatitis A-infected animals, work in hepatitis A research labs, or travel to or work in countries with a high rate of hepatitis A should be  immunized. Adults who were previously unvaccinated and who anticipate close contact with an international adoptee during the first 60 days after arrival in the [place] from a country with a high rate of hepatitis A should be immunized.  Hepatitis B vaccine.  Adults who wish to be protected from this disease, have certain high-risk conditions, may be exposed to blood or other infectious  body fluids, are household contacts or sex partners of hepatitis B positive people, are clients or workers in certain care facilities, or travel to or work in countries with a high rate of hepatitis B should be immunized.  Haemophilus influenzae type b (Hib) vaccine. A previously unvaccinated person with asplenia or sickle cell disease or having a scheduled splenectomy should receive 1 dose of Hib vaccine. Regardless of previous immunization, a recipient of a hematopoietic stem cell transplant should receive a 3-dose series 6 12 months after her successful transplant. Hib vaccine is not recommended for adults with HIV infection.  Preventive Services / Frequency Ages 6 to 39years  Blood pressure check.** / Every 1 to 2 years.  Lipid and cholesterol check.** / Every 5 years beginning at age 39.  Clinical breast exam.** / Every 3 years for women in their 61s and 62s.  BRCA-related cancer risk assessment.** / For women who have family members with a BRCA-related cancer (breast, ovarian, tubal, or peritoneal cancers).  Pap test.** / Every 2 years from ages 47 through 85. Every 3 years starting at age 34 through age 12 or 74 with a history of 3 consecutive normal Pap tests.  HPV screening.** / Every 3 years from ages 46 through ages 43 to 54 with a history of 3 consecutive normal Pap tests.  Hepatitis C blood test.** / For any individual with known risks for hepatitis C.  Skin self-exam. / Monthly.  Influenza vaccine. / Every year.  Tetanus, diphtheria, and acellular pertussis (Tdap, Td) vaccine.** / Consult your health care provider. Pregnant women should receive 1 dose of Tdap vaccine during each pregnancy. 1 dose of Td every 10 years.  Varicella vaccine.** / Consult your health care provider. Pregnant females who do not have evidence of immunity should receive the first dose after pregnancy.  HPV vaccine. / 3 doses over 6 months, if 64 and younger. The vaccine is not recommended for use in  pregnant females. However, pregnancy testing is not needed before receiving a dose.  Measles, mumps, rubella (MMR) vaccine.** / You need at least 1 dose of MMR if you were born in 1957 or later. You may also need a 2nd dose. For females of childbearing age, rubella immunity should be determined. If there is no evidence of immunity, females who are not pregnant should be vaccinated. If there is no evidence of immunity, females who are pregnant should delay immunization until after pregnancy.  Pneumococcal 13-valent conjugate (PCV13) vaccine.** / Consult your health care provider.  Pneumococcal polysaccharide (PPSV23) vaccine.** / 1 to 2 doses if you smoke cigarettes or if you have certain conditions.  Meningococcal vaccine.** / 1 dose if you are age 71 to 37 years and a Market researcher living in a residence hall, or have one of several medical conditions, you need to get vaccinated against meningococcal disease. You may also need additional booster doses.  Hepatitis A vaccine.** / Consult your health care provider.  Hepatitis B vaccine.** / Consult your health care provider.  Haemophilus influenzae type b (Hib) vaccine.** / Consult your health care provider.  Ages 55 to 64years  Blood pressure check.** / Every 1 to 2 years.  Lipid and cholesterol check.** / Every 5 years beginning at age 20 years.  Lung cancer screening. / Every year if you are aged 55 80 years and have a 30-pack-year history of smoking and currently smoke or have quit within the past 15 years. Yearly screening is stopped once you have quit smoking for at least 15 years or develop a health problem that would prevent you from having lung cancer treatment.  Clinical breast exam.** / Every year after age 40 years.  BRCA-related cancer risk assessment.** / For women who have family members with a BRCA-related cancer (breast, ovarian, tubal, or peritoneal cancers).  Mammogram.** / Every year beginning at age 40  years and continuing for as long as you are in good health. Consult with your health care provider.  Pap test.** / Every 3 years starting at age 30 years through age 65 or 70 years with a history of 3 consecutive normal Pap tests.  HPV screening.** / Every 3 years from ages 30 years through ages 65 to 70 years with a history of 3 consecutive normal Pap tests.  Fecal occult blood test (FOBT) of stool. / Every year beginning at age 50 years and continuing until age 75 years. You may not need to do this test if you get a colonoscopy every 10 years.  Flexible sigmoidoscopy or colonoscopy.** / Every 5 years for a flexible sigmoidoscopy or every 10 years for a colonoscopy beginning at age 50 years and continuing until age 75 years.  Hepatitis C blood test.** / For all people born from 1945 through 1965 and any individual with known risks for hepatitis C.  Skin self-exam. / Monthly.  Influenza vaccine. / Every year.  Tetanus, diphtheria, and acellular pertussis (Tdap/Td) vaccine.** / Consult your health care provider. Pregnant women should receive 1 dose of Tdap vaccine during each pregnancy. 1 dose of Td every 10 years.  Varicella vaccine.** / Consult your health care provider. Pregnant females who do not have evidence of immunity should receive the first dose after pregnancy.  Zoster vaccine.** / 1 dose for adults aged 60 years or older.  Measles, mumps, rubella (MMR) vaccine.** / You need at least 1 dose of MMR if you were born in 1957 or later. You may also need a 2nd dose. For females of childbearing age, rubella immunity should be determined. If there is no evidence of immunity, females who are not pregnant should be vaccinated. If there is no evidence of immunity, females who are pregnant should delay immunization until after pregnancy.  Pneumococcal 13-valent conjugate (PCV13) vaccine.** / Consult your health care provider.  Pneumococcal polysaccharide (PPSV23) vaccine.** / 1 to 2 doses if  you smoke cigarettes or if you have certain conditions.  Meningococcal vaccine.** / Consult your health care provider.  Hepatitis A vaccine.** / Consult your health care provider.  Hepatitis B vaccine.** / Consult your health care provider.  Haemophilus influenzae type b (Hib) vaccine.** / Consult your health care provider.  Ages 65 years and over  Blood pressure check.** / Every 1 to 2 years.  Lipid and cholesterol check.** / Every 5 years beginning at age 20 years.  Lung cancer screening. / Every year if you are aged 55 80 years and have a 30-pack-year history of smoking and currently smoke or have quit within the past 15 years. Yearly screening is stopped once you have quit smoking for at least 15 years or develop a health problem that   would prevent you from having lung cancer treatment.  Clinical breast exam.** / Every year after age 103 years.  BRCA-related cancer risk assessment.** / For women who have family members with a BRCA-related cancer (breast, ovarian, tubal, or peritoneal cancers).  Mammogram.** / Every year beginning at age 36 years and continuing for as long as you are in good health. Consult with your health care provider.  Pap test.** / Every 3 years starting at age 5 years through age 85 or 10 years with 3 consecutive normal Pap tests. Testing can be stopped between 65 and 70 years with 3 consecutive normal Pap tests and no abnormal Pap or HPV tests in the past 10 years.  HPV screening.** / Every 3 years from ages 93 years through ages 70 or 45 years with a history of 3 consecutive normal Pap tests. Testing can be stopped between 65 and 70 years with 3 consecutive normal Pap tests and no abnormal Pap or HPV tests in the past 10 years.  Fecal occult blood test (FOBT) of stool. / Every year beginning at age 8 years and continuing until age 45 years. You may not need to do this test if you get a colonoscopy every 10 years.  Flexible sigmoidoscopy or colonoscopy.** /  Every 5 years for a flexible sigmoidoscopy or every 10 years for a colonoscopy beginning at age 69 years and continuing until age 68 years.  Hepatitis C blood test.** / For all people born from [birth year] through 1966 and any individual with known risks for hepatitis C.  Osteoporosis screening.** / A one-time screening for women ages 7 years and over and women at risk for fractures or osteoporosis.  Skin self-exam. / Monthly.  Influenza vaccine. / Every year.  Tetanus, diphtheria, and acellular pertussis (Tdap/Td) vaccine.** / 1 dose of Td every 10 years.  Varicella vaccine.** / Consult your health care provider.  Zoster vaccine.** / 1 dose for adults aged 5 years or older.  Pneumococcal 13-valent conjugate (PCV13) vaccine.** / Consult your health care provider.  Pneumococcal polysaccharide (PPSV23) vaccine.** / 1 dose for all adults aged 74 years and older.  Meningococcal vaccine.** / Consult your health care provider.  Hepatitis A vaccine.** / Consult your health care provider.  Hepatitis B vaccine.** / Consult your health care provider.  Haemophilus influenzae type b (Hib) vaccine.** / Consult your health care provider. ** Family history and personal history of risk and conditions may change your health care provider's recommendations. Document Released: 12/08/2001 Document Revised: 08/02/2013  [facility] Patient Information 2014 [place], [place].   EXERCISE AND DIET:  We recommended that you start or continue a regular exercise program for good health. Regular exercise means any activity that makes your heart beat faster and makes you sweat.  We recommend exercising at least 30 minutes per day at least 3 days a week, preferably 5.  We also recommend a diet low in fat and sugar / carbohydrates.  Inactivity, poor dietary choices and obesity can cause diabetes, heart attack, stroke, and kidney damage, among others.     ALCOHOL AND SMOKING:  Women should limit their alcohol intake to no  more than 7 drinks/beers/glasses of wine (combined, not each!) per week. Moderation of alcohol intake to this level decreases your risk of breast cancer and liver damage.  ( And of course, no recreational drugs are part of a healthy lifestyle.)  Also, you should not be smoking at all or even being exposed to second hand smoke. Most people know smoking can  cause cancer, and various heart and lung diseases, but did you know it also contributes to weakening of your bones?  Aging of your skin?  Yellowing of your teeth and nails?   CALCIUM AND VITAMIN D:  Adequate intake of calcium and Vitamin D are recommended.  The recommendations for exact amounts of these supplements seem to change often, but generally speaking 600 mg of calcium (either carbonate or citrate) and 800 units of Vitamin D per day seems prudent. Certain women may benefit from higher intake of Vitamin D.  If you are among these women, your doctor will have told you during your visit.     PAP SMEARS:  Pap smears, to check for cervical cancer or precancers,  have traditionally been done yearly, although recent scientific advances have shown that most women can have pap smears less often.  However, every woman still should have a physical exam from her gynecologist or primary care physician every year. It will include a breast check, inspection of the vulva and vagina to check for abnormal growths or skin changes, a visual exam of the cervix, and then an exam to evaluate the size and shape of the uterus and ovaries.  And after 59 years of age, a rectal exam is indicated to check for rectal cancers. We will also provide age appropriate advice regarding health maintenance, like when you should have certain vaccines, screening for sexually transmitted diseases, bone density testing, colonoscopy, mammograms, etc.    MAMMOGRAMS:  All women over 3 years old should have a yearly mammogram. Many facilities now offer a "3D" mammogram, which may cost  around $50 extra out of pocket. If possible,  we recommend you accept the option to have the 3D mammogram performed.  It both reduces the number of women who will be called back for extra views which then turn out to be normal, and it is better than the routine mammogram at detecting truly abnormal areas.     COLONOSCOPY:  Colonoscopy to screen for colon cancer is recommended for all women at age 79.  We know, you hate the idea of the prep.  We agree, BUT, having colon cancer and not knowing it is worse!!  Colon cancer so often starts as a polyp that can be seen and removed at colonscopy, which can quite literally save your life!  And if your first colonoscopy is normal and you have no family history of colon cancer, most women don't have to have it again for 10 years.  Once every ten years, you can do something that may end up saving your life, right?  We will be happy to help you get it scheduled when you are ready.  Be sure to check your insurance coverage so you understand how much it will cost.  It may be covered as a preventative service at no cost, but you should check your particular policy.   Continue all medications as directed. Remain well hydrated with water, follow heart healthy diet. Remain as active as possible. Recommend re-checking A1c (average of your blood sugar over the last 3 months) in 6 months- continue to reduce your carbohydrate intake. GREAT reduction in your cholesterol panel! Continue to social distance and wear a mask when in public. Follow-up in 6 months. GREAT TO SEE YOU!

## 2019-09-11 NOTE — Assessment & Plan Note (Signed)
The 10-year ASCVD risk score Mikey Bussing DC Brooke Bonito., et al., 2013) is: 2.8%   Values used to calculate the score:     Age: 58 years     Sex: Female     Is Non-Hispanic African American: No     Diabetic: No     Tobacco smoker: Yes     Systolic Blood Pressure: 95 mmHg     Is BP treated: No     HDL Cholesterol: 67 mg/dL     Total Cholesterol: 174 mg/dL  LDL-94, down from 222 Continue Atorvastatin 10mg  QD LFTs-normal

## 2019-09-11 NOTE — Assessment & Plan Note (Signed)
Continue all medications as directed. Remain well hydrated with water, follow heart healthy diet. Remain as active as possible. Recommend re-checking A1c (average of your blood sugar over the last 3 months) in 6 months- continue to reduce your carbohydrate intake. GREAT reduction in your cholesterol panel! Continue to social distance and wear a mask when in public. Follow-up in 6 months.

## 2019-09-19 ENCOUNTER — Encounter: Payer: Self-pay | Admitting: Adult Health

## 2019-09-20 ENCOUNTER — Telehealth: Payer: Self-pay | Admitting: Adult Health

## 2019-09-20 NOTE — Telephone Encounter (Signed)
Patient health maintenance needed HIV & Hep C screening verification-- requested info from pt's last PCP office (reply was no documentation of pt screened for neither condition.  --FYI to med staff.  --Dion Body

## 2019-09-20 NOTE — Telephone Encounter (Signed)
Noted.  T. Nelson, CMA 

## 2019-10-10 ENCOUNTER — Other Ambulatory Visit: Payer: Self-pay | Admitting: Gastroenterology

## 2019-10-10 NOTE — Telephone Encounter (Signed)
May refill until time of follow-up visit. Could be virtual. Thanks.

## 2019-11-02 IMAGING — CT CT ABD-PELV W/ CM
2 of 5 series · 16 of 46 positions shown, 18 images · IV contrast (APPLIED)
Comparison: 11/15/2017 abdominal ultrasound

CLINICAL DATA: Mid abdominal cramping today with nausea and
diarrhea.

EXAM:
CT ABDOMEN AND PELVIS WITH CONTRAST
TECHNIQUE: Multidetector CT imaging of the abdomen and pelvis was performed
using the standard protocol following bolus administration of
intravenous contrast.
CONTRAST:  100mL OMNIPAQUE IOHEXOL 300 MG/ML  SOLN

[Series 3: abd/ pelvis 5.0 i30f 2 · axial · 0.66mm/px · z∈[+754,+1134]mm · 13 of 86 slices shown, 15 images]
[im 5/86  soft-tissue]
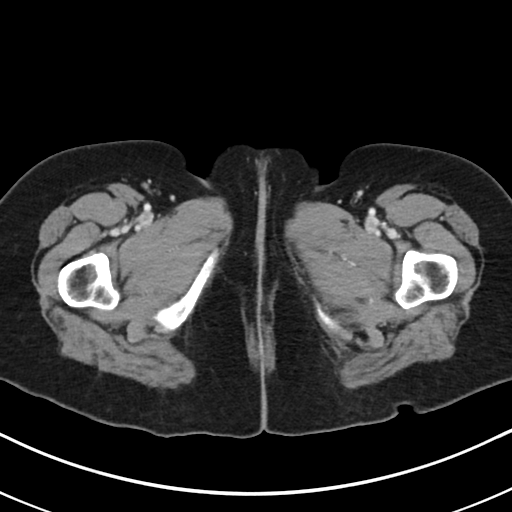
[im 5/86  bone]
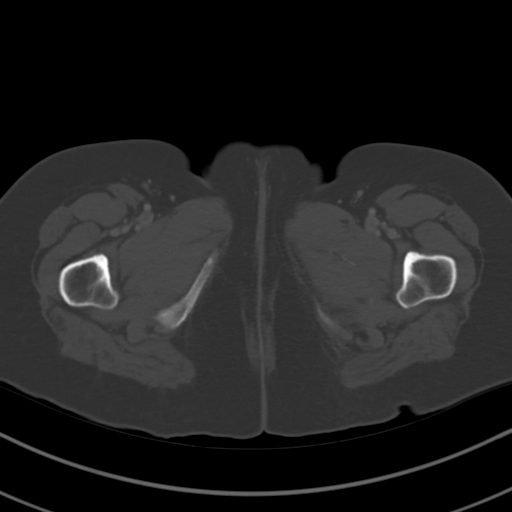
[im 10/86  soft-tissue]
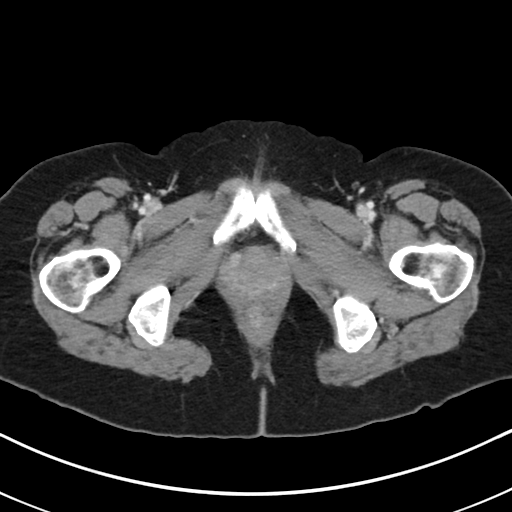
[im 19/86  soft-tissue]
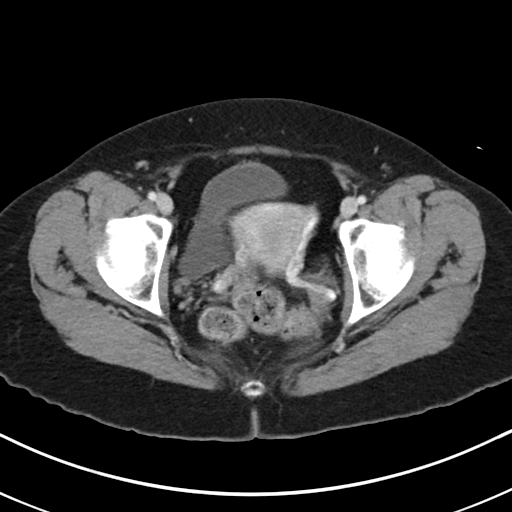
[im 24/86  soft-tissue]
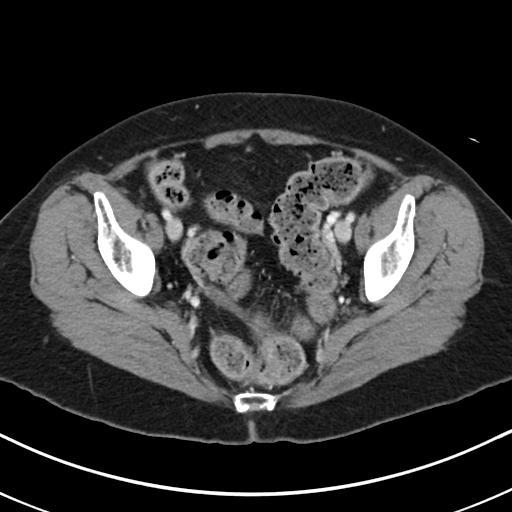
[im 29/86  soft-tissue]
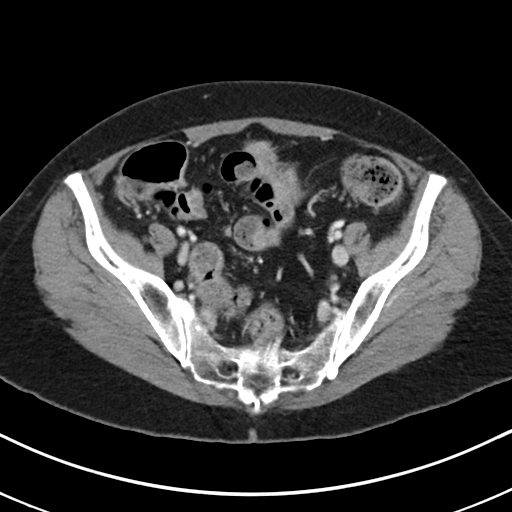
[im 38/86  soft-tissue]
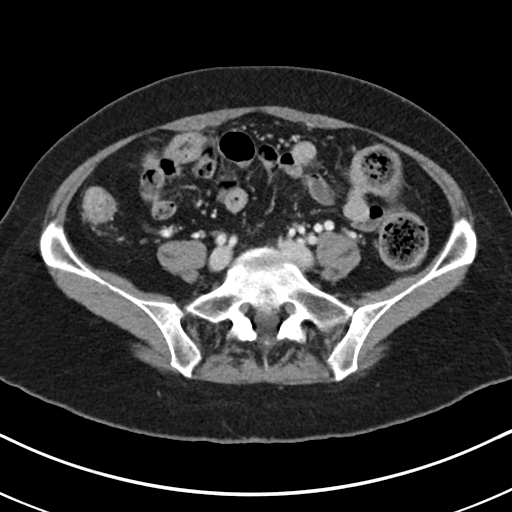
[im 43/86  soft-tissue]
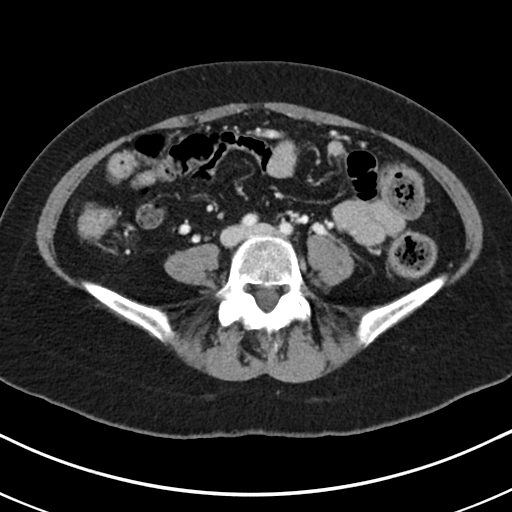
[im 48/86  soft-tissue]
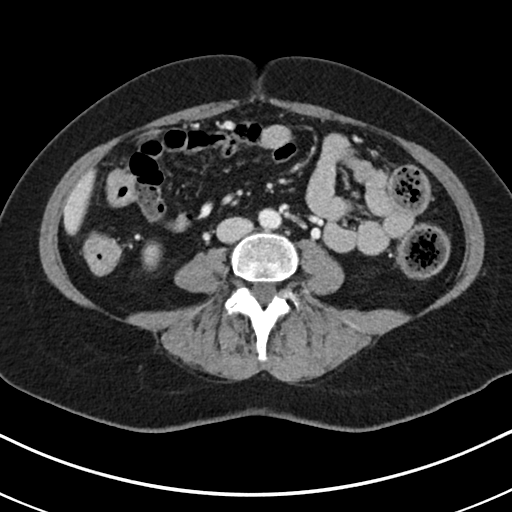
[im 57/86  soft-tissue]
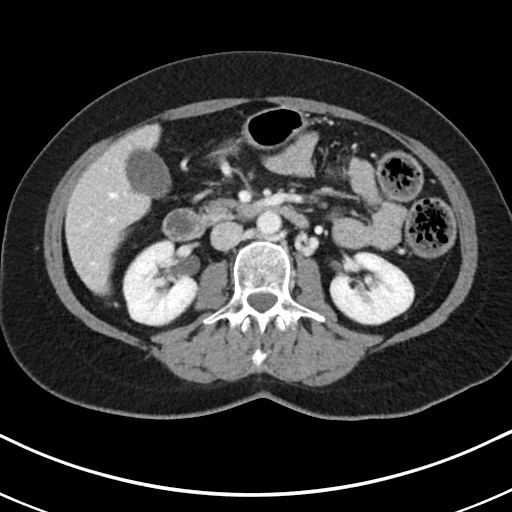
[im 57/86  bone]
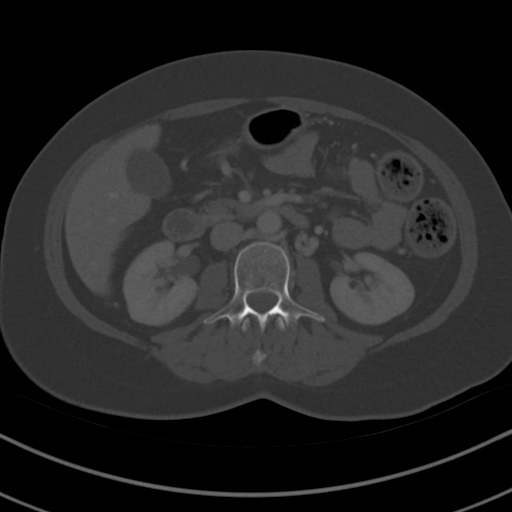
[im 62/86  soft-tissue]
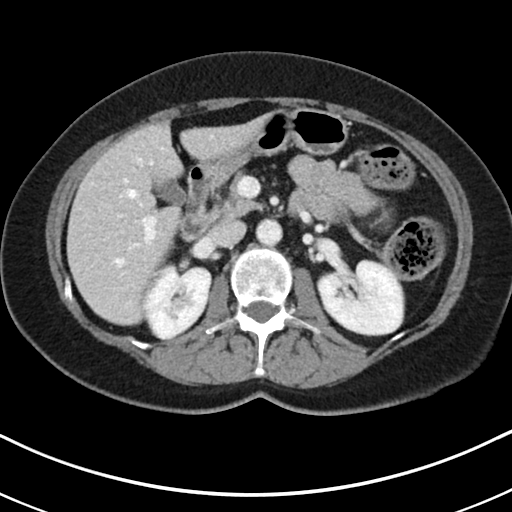
[im 67/86  soft-tissue]
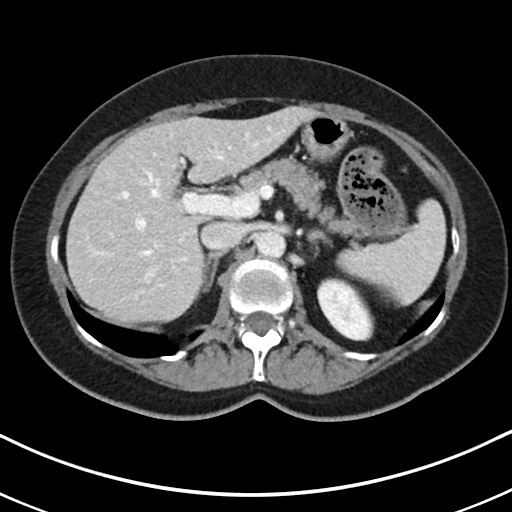
[im 76/86  soft-tissue]
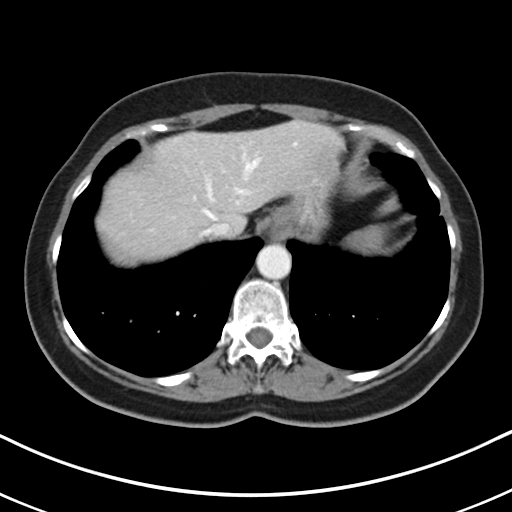
[im 81/86  soft-tissue]
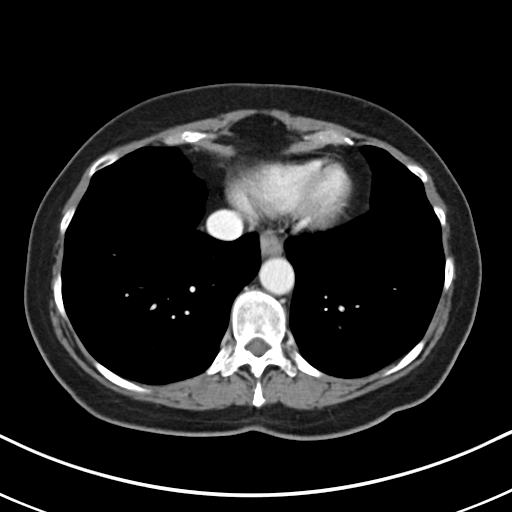

[Series 6: coronal soft tissue · coronal · 0.83mm/px · 3 of 100 slices shown]
[im 34/100  soft-tissue]
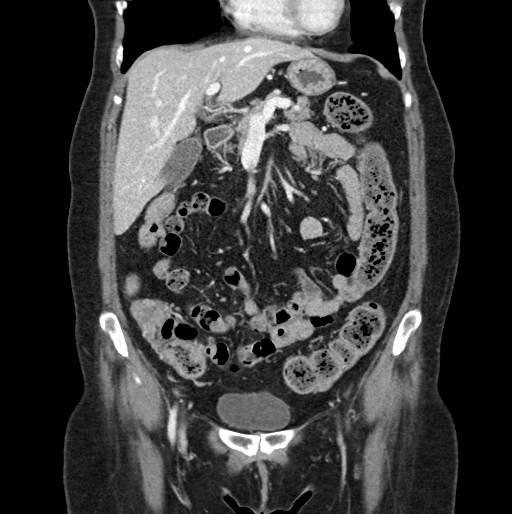
[im 45/100  soft-tissue]
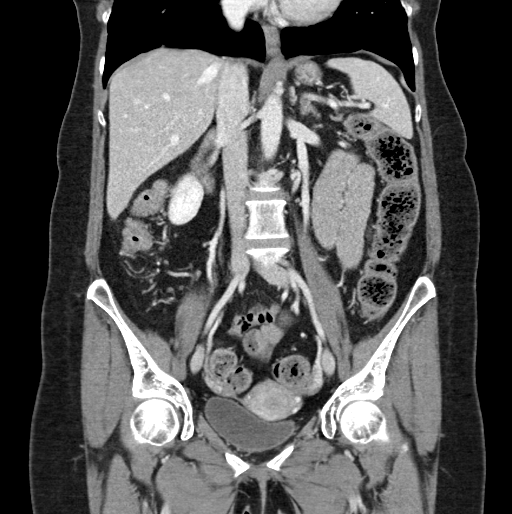
[im 56/100  soft-tissue]
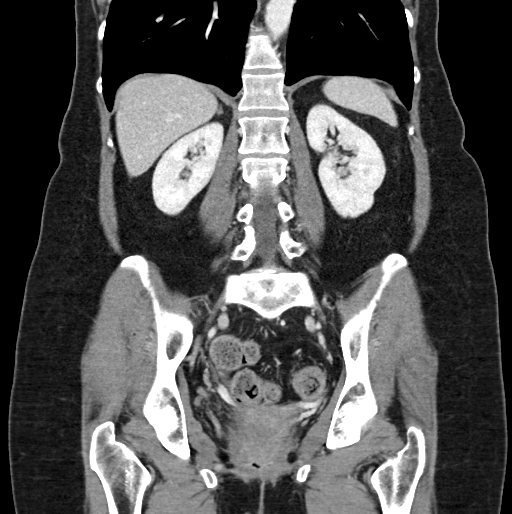

[16 of 46 positions shown; findings below may reference images not displayed]

FINDINGS: Lower chest: Normal heart size without pericardial effusion. Mild
pleural thickening along the major fissure on the right.

Hepatobiliary: 11 mm cyst or hemangioma in the left hepatic lobe.
Gallbladder is free of stones. No secondary signs of acute
cholecystitis. No biliary dilatation. Patent portal and splenic
veins.

Pancreas: Normal

Spleen: Normal

Adrenals/Urinary Tract: Normal bilateral adrenal glands and kidneys.
The urinary bladder is unremarkable for the degree of distention.

Stomach/Bowel: Large amount of retained stool from splenic flexure
to the rectum consistent with constipation. No bowel obstruction or
definite inflammation. The stomach is contracted in appearance.
Duodenal sweep and ligament of Treitz are unremarkable. Contracted
right colon limiting further assessment for inflammatory change. The
appendix is not confidently identified.

Vascular/Lymphatic: Nonaneurysmal abdominal aorta. Patent branch
vessels. No lymphadenopathy. Mild prominence of periuterine vessels
may reflect stigmata of pelvic vascular congestion

Reproductive: Uterus and bilateral adnexa are unremarkable.

Other: No abdominal wall hernia or abnormality. No abdominopelvic
ascites.

Musculoskeletal: S1-S2 disc. No acute osseous abnormality.
IMPRESSION: 1. Increased colonic stool burden consistent with constipation. No
bowel obstruction or inflammation.
2. 11 mm cyst or hemangioma in the left hepatic lobe.
3. Mild prominence of periuterine vessels which may reflect stigmata
of pelvic vascular congestion syndrome.

## 2019-11-13 ENCOUNTER — Other Ambulatory Visit: Payer: Self-pay | Admitting: Gastroenterology

## 2019-12-12 ENCOUNTER — Other Ambulatory Visit: Payer: Self-pay | Admitting: Gastroenterology

## 2020-01-11 ENCOUNTER — Other Ambulatory Visit: Payer: Self-pay | Admitting: Gastroenterology

## 2020-01-11 ENCOUNTER — Other Ambulatory Visit: Payer: Self-pay | Admitting: Adult Health

## 2020-01-18 ENCOUNTER — Ambulatory Visit: Payer: BC Managed Care – PPO | Attending: Internal Medicine

## 2020-01-18 DIAGNOSIS — Z23 Encounter for immunization: Secondary | ICD-10-CM

## 2020-01-18 NOTE — Progress Notes (Signed)
   Covid-19 Vaccination Clinic  Name:  Felicia Good    MRN: [id number] DOB: [birth year]  01/18/2020  [name] was observed post Covid-19 immunization for 15 minutes without incident. She was provided with Vaccine Information Sheet and instruction to access the V-Safe system.   [name] was instructed to call 911 with any severe reactions post vaccine: Marland Kitchen Difficulty breathing  . Swelling of face and throat  . A fast heartbeat  . A bad rash all over body  . Dizziness and weakness   Immunizations Administered    Name Date Dose VIS Date Route   Pfizer COVID-19 Vaccine 01/18/2020 11:13 AM 0.3 mL 10/06/2019 Intramuscular   Manufacturer: [place]   Lot: [id number]   [place]: [id number]

## 2020-01-24 ENCOUNTER — Other Ambulatory Visit: Payer: Self-pay | Admitting: Family Medicine

## 2020-01-24 NOTE — Telephone Encounter (Signed)
[place] Controlled Substance Database reviewed.  No aberrancies noted.  Pt is UTD on f/u visits.  Charyl Bigger, CMA

## 2020-01-25 NOTE — Telephone Encounter (Signed)
This would be pt's 2nd prescription in the last 12 months.

## 2020-01-25 NOTE — Telephone Encounter (Signed)
Our clinic policy is not more than 2  30 d supplies in a 12 mo period or they will need drug contract and probable referral to psychiatry for this cont mgt.   No Rf unless meets this criteria

## 2020-01-31 ENCOUNTER — Ambulatory Visit: Payer: BC Managed Care – PPO

## 2020-02-12 ENCOUNTER — Ambulatory Visit: Payer: BC Managed Care – PPO | Attending: Internal Medicine

## 2020-02-12 DIAGNOSIS — Z23 Encounter for immunization: Secondary | ICD-10-CM

## 2020-02-12 NOTE — Progress Notes (Signed)
   Covid-19 Vaccination Clinic  Name:  Felicia Good    MRN: [id number] DOB: May 11, 1961  02/12/2020  [name] was observed post Covid-19 immunization for 15 minutes without incident. She was provided with Vaccine Information Sheet and instruction to access the V-Safe system.   [name] was instructed to call 911 with any severe reactions post vaccine: Marland Kitchen Difficulty breathing  . Swelling of face and throat  . A fast heartbeat  . A bad rash all over body  . Dizziness and weakness   Immunizations Administered    Name Date Dose VIS Date Route   Pfizer COVID-19 Vaccine 02/12/2020  9:58 AM 0.3 mL 12/20/2018 Intramuscular   Manufacturer: [place]   Lot: [id number]   [place]: [id number]

## 2020-02-20 ENCOUNTER — Other Ambulatory Visit: Payer: Self-pay | Admitting: Adult Health

## 2020-03-11 ENCOUNTER — Ambulatory Visit (INDEPENDENT_AMBULATORY_CARE_PROVIDER_SITE_OTHER): Payer: BC Managed Care – PPO | Admitting: Physician Assistant

## 2020-03-11 ENCOUNTER — Encounter: Payer: Self-pay | Admitting: Physician Assistant

## 2020-03-11 ENCOUNTER — Other Ambulatory Visit: Payer: Self-pay

## 2020-03-11 VITALS — BP 91/61 | HR 105 | Temp 98.2°F | Ht 67.75 in | Wt 148.8 lb

## 2020-03-11 DIAGNOSIS — F329 Major depressive disorder, single episode, unspecified: Secondary | ICD-10-CM | POA: Diagnosis not present

## 2020-03-11 DIAGNOSIS — F419 Anxiety disorder, unspecified: Secondary | ICD-10-CM

## 2020-03-11 DIAGNOSIS — F32A Depression, unspecified: Secondary | ICD-10-CM

## 2020-03-11 DIAGNOSIS — L821 Other seborrheic keratosis: Secondary | ICD-10-CM | POA: Diagnosis not present

## 2020-03-11 DIAGNOSIS — E785 Hyperlipidemia, unspecified: Secondary | ICD-10-CM | POA: Diagnosis not present

## 2020-03-11 MED ORDER — CLONAZEPAM 0.5 MG PO TABS: 0.5000 mg | ORAL_TABLET | Freq: Every evening | ORAL | 0 refills | Status: DC | PRN

## 2020-03-11 MED ORDER — CITALOPRAM HYDROBROMIDE 40 MG PO TABS: 40.0000 mg | ORAL_TABLET | Freq: Every day | ORAL | 0 refills | Status: DC

## 2020-03-11 NOTE — Patient Instructions (Addendum)
Alternative antidepressant and anxiety medications: - Zoloft - Lexapro - Effexor   Adjunct therapy options: - Buspar - Hydroxyzine        Seborrheic Keratosis A seborrheic keratosis is a common, noncancerous (benign) skin growth. These growths are velvety, waxy, rough, tan, brown, or black spots that appear on the skin. These skin growths can be flat or raised, and scaly. What are the causes? The cause of this condition is not known. What increases the risk? You are more likely to develop this condition if you:  Have a family history of seborrheic keratosis.  Are 50 or older.  Are pregnant.  Have had estrogen replacement therapy. What are the signs or symptoms? Symptoms of this condition include growths on the face, chest, shoulders, back, or other areas. These growths:  Are usually painless, but may become irritated and itchy.  Can be yellow, brown, black, or other colors.  Are slightly raised or have a flat surface.  Are sometimes rough or wart-like in texture.  Are often velvety or waxy on the surface.  Are round or oval-shaped.  Often occur in groups, but may occur as a single growth. How is this diagnosed? This condition is diagnosed with a medical history and physical exam.  A sample of the growth may be tested (skin biopsy).  You may need to see a skin specialist (dermatologist). How is this treated? Treatment is not usually needed for this condition, unless the growths are irritated or bleed often.  You may also choose to have the growths removed if you do not like their appearance. ? Most commonly, these growths are treated with a procedure in which liquid nitrogen is applied to "freeze" off the growth (cryosurgery). ? They may also be burned off with electricity (electrocautery) or removed by scraping (curettage). Follow these instructions at home:  Watch your growth for any changes.  Keep all follow-up visits as told by your health care provider.  This is important.  Do not scratch or pick at the growth or growths. This can cause them to become irritated or infected. Contact a health care provider if:  You suddenly have many new growths.  Your growth bleeds, itches, or hurts.  Your growth suddenly becomes larger or changes color. Summary  A seborrheic keratosis is a common, noncancerous (benign) skin growth.  Treatment is not usually needed for this condition, unless the growths are irritated or bleed often.  Watch your growth for any changes.  Contact a health care provider if you suddenly have many new growths or your growth suddenly becomes larger or changes color.  Keep all follow-up visits as told by your health care provider. This is important. This information is not intended to replace advice given to you by your health care provider. Make sure you discuss any questions you have with your health care provider. Document Revised: 02/24/2018 Document Reviewed: 02/24/2018 Elsevier Patient Education  [place].

## 2020-03-11 NOTE — Progress Notes (Signed)
Established Patient Office Visit  Subjective:  Patient ID: Felicia Good, female    DOB: [birth year]  Age: 59 y.o. MRN: [id number]  CC:  Chief Complaint  Patient presents with  . Hyperlipidemia  . Anxiety    HPI Felicia Good presents for chronic 6 month follow-up on hypercholesterolemia and mood management.   Hypercholesterolemia: Pt taking medication as directed without issues. Denies side effects including myalgias and RUQ pain. She does try to follow a heart healthy diet. She has reduced red meats and uses unsalted butter. Her physical activity level has decreased due to increase family responsibilities as the main caregiver.     Mood, depression, anxiety: Pt reports her mood was doing better but feels like she's regressing due to increased personal stress and the pandemic. She is feeling more anxious. Her mother has dementia and she's the only daughter, so she is concerned about her risk of dementia and is trying to be conservative by limiting medications that can interfere with cognition. She has been taking only 0.5 tablet of Celexa to see if it can help manage her mood without having to be on the full dose of 40 mg. She wants to try this for at least 4 weeks and see if the dose is effective. If not, she plans to resume full dose. She takes care of her mother, babysit's her grandchild, and takes care of her family which has reduced time for herself and physical activity. Physical activity helps her relax and decompress. She also enjoys spending time with her daughter. Denies SI/HI.  Skin lesion: She has a non-tender skin lesion she wants looked at.   Past Medical History:  Diagnosis Date  . Anxiety   . Depression   . IBS (irritable bowel syndrome)   . Migraine headache   . Mitral valve prolapse   . Panic attacks   . Rectal polyp   . RLS (restless legs syndrome)     Past Surgical History:  Procedure Laterality Date  . rectal polyp removed  1987   Local anesthesia used  .  RECTAL POLYPECTOMY      Family History  Problem Relation Age of Onset  . Other Father        esophageal problems  . Heart failure Father   . Cirrhosis Father        Non alcoholic   . Stroke Father        Died in 75   . Hypertension Father   . Hypertension Mother   . Stroke Mother   . Dementia Mother   . Breast cancer Maternal Aunt   . Colon cancer Neg Hx     Social History   Socioeconomic History  . Marital status: Married    Spouse name: Felicia Good  . Number of children: 1  . Years of education: Not on file  . Highest education level: Not on file  Occupational History  . Occupation: caregiver  Tobacco Use  . Smoking status: Current Every Day Smoker    Packs/day: 0.25    Years: 40.00    Pack years: 10.00    Types: Cigarettes  . Smokeless tobacco: Never Used  . Tobacco comment: has cut back  Substance and Sexual Activity  . Alcohol use: Yes    Alcohol/week: 0.0 standard drinks    Comment: occ  . Drug use: No  . Sexual activity: Not Currently    Birth control/protection: None  Other Topics Concern  . Not on file  Social History Narrative  Not employed    Married    One child       She [place] to be at the farm, walking with dog, she likes to go to [employer].    Social Determinants of Health   Financial Resource Strain:   . Difficulty of Paying Living Expenses:   Food Insecurity:   . Worried About Charity fundraiser in the Last Year:   . Arboriculturist in the Last Year:   Transportation Needs:   . Film/video editor (Medical):   Marland Kitchen Lack of Transportation (Non-Medical):   Physical Activity:   . Days of Exercise per Week:   . Minutes of Exercise per Session:   Stress:   . Feeling of Stress :   Social Connections:   . Frequency of Communication with Friends and Family:   . Frequency of Social Gatherings with Friends and Family:   . Attends Religious Services:   . Active Member of Clubs or Organizations:   . Attends Archivist Meetings:    Marland Kitchen Marital Status:   Intimate Partner Violence:   . Fear of Current or Ex-Partner:   . Emotionally Abused:   Marland Kitchen Physically Abused:   . Sexually Abused:     Outpatient Medications Prior to Visit  Medication Sig Dispense Refill  . atorvastatin (LIPITOR) 10 MG tablet TAKE 1 TABLET BY MOUTH DAILY. 90 tablet 0  . Cholecalciferol (VITAMIN D3) 2000 units TABS Take 1 tablet by mouth daily.    . cyclobenzaprine (FLEXERIL) 10 MG tablet Take 1 tablet (10 mg total) by mouth 3 (three) times daily as needed for muscle spasms. 30 tablet 0  . dicyclomine (BENTYL) 20 MG tablet TAKE 1 TABLET BY MOUTH 3 TIMES A DAY BEFORE MEALS 180 tablet 1  . fluticasone (FLONASE) 50 MCG/ACT nasal spray Place 1 spray into both nostrils 2 (two) times daily as needed for allergies or rhinitis.    . pantoprazole (PROTONIX) 40 MG tablet Take 1 tablet (40 mg total) by mouth 2 (two) times daily before a meal. 60 tablet 3  . Prenatal Vit-Fe Fumarate-FA (PRENATAL VITAMIN PO) Take 1 tablet by mouth daily.    . TRIAMCINOLONE ACETONIDE EX 1 application Topical 2 times daily    . citalopram (CELEXA) 40 MG tablet Take 1 tablet (40 mg total) by mouth daily. 90 tablet 1  . clonazePAM (KLONOPIN) 0.5 MG tablet Take 1 tablet (0.5 mg total) by mouth at bedtime as needed for anxiety. 30 tablet 0   No facility-administered medications prior to visit.    No Known Allergies  ROS Review of Systems   A fourteen system review of systems was performed and found to be positive as per HPI.   Objective:    Physical Exam General:  Well Developed, well nourished, appropriate for stated age.  Neuro:  Alert and oriented,  extra-ocular muscles intact  HEENT:  Normocephalic, atraumatic, neck supple, no carotid bruits appreciated  Skin:  no gross rash, warm, pink. Small, waxy skin lesion noted on R groin area. Cardiac:  RRR, S1 S2 Respiratory:  ECTA B/L and A/P, Not using accessory muscles, speaking in full sentences- unlabored. Vascular:  Ext  warm, no cyanosis apprec.; cap RF less 2 sec. Psych:  No HI/SI, judgement and insight good, Mood- stable and appropriate.    BP 91/61   Pulse (!) 105   Temp 98.2 F (36.8 C) (Oral)   Ht 5' 7.75" (1.721 m)   Wt 148 lb 12.8 oz (67.5  kg)   LMP 11/26/2013   SpO2 97% Comment: on RA  BMI 22.79 kg/m  Wt Readings from Last 3 Encounters:  03/11/20 148 lb 12.8 oz (67.5 kg)  09/11/19 153 lb (69.4 kg)  02/13/19 150 lb (68 kg)     Health Maintenance Due  Topic Date Due  . HIV Screening  Never done    There are no preventive care reminders to display for this patient.  Lab Results  Component Value Date   TSH 2.180 09/07/2019   Lab Results  Component Value Date   WBC 5.2 09/07/2019   HGB 13.1 09/07/2019   HCT 38.3 09/07/2019   MCV 94 09/07/2019   PLT 211 09/07/2019   Lab Results  Component Value Date   NA 141 09/07/2019   K 4.3 09/07/2019   CO2 23 09/07/2019   GLUCOSE 92 09/07/2019   BUN 16 09/07/2019   CREATININE 0.79 09/07/2019   BILITOT 0.4 09/07/2019   ALKPHOS 85 09/07/2019   AST 26 09/07/2019   ALT 29 09/07/2019   PROT 6.8 09/07/2019   ALBUMIN 4.5 09/07/2019   CALCIUM 9.6 09/07/2019   ANIONGAP 9 09/08/2018   GFR 96.10 11/08/2017   Lab Results  Component Value Date   CHOL 174 09/07/2019   Lab Results  Component Value Date   HDL 67 09/07/2019   Lab Results  Component Value Date   LDLCALC 94 09/07/2019   Lab Results  Component Value Date   TRIG 66 09/07/2019   Lab Results  Component Value Date   CHOLHDL 2.6 09/07/2019   Lab Results  Component Value Date   HGBA1C 5.6 09/07/2019      Assessment & Plan:   Problem List Items Addressed This Visit      Other   Depression   Relevant Medications   citalopram (CELEXA) 40 MG tablet   Hyperlipidemia - Primary   Anxiety   Relevant Medications   citalopram (CELEXA) 40 MG tablet     HLD: - Last lipid panel wnl's. - Continue Atorvastatin 10 mg. - Continue heart healthy diet and stay as active  as possible.    Mood, Depression, Anxiety: - stable, PHQ9 score of 3. - Take 0.5 tablet of Celexa 40 mg x 4 weeks and if dose not helping control anxiety, increase to full dose of 40 mg. Continue Clonazepam prn. - Discussed with patient other alternative medications we can consider changing to if she feels like Celexa is not longer effective. Gave her a list of alternatives and adjunct therapy for her to research and we can discuss at her follow-up visit. Pt verbalized understanding and all questions were answered.  Seborrheic keratosis:  - Skin lesion is highly consistent with SK , benign.  - Advised dermatology referral if lesion changes size, color, or appearance.   Meds ordered this encounter  Medications  . clonazePAM (KLONOPIN) 0.5 MG tablet    Sig: Take 1 tablet (0.5 mg total) by mouth at bedtime as needed for anxiety.    Dispense:  30 tablet    Refill:  0  . citalopram (CELEXA) 40 MG tablet    Sig: Take 1 tablet (40 mg total) by mouth daily.    Dispense:  90 tablet    Refill:  0    Follow-up: Return in about 3 months (around 06/11/2020) for Mood.    Lorrene Reid, PA-C

## 2020-05-13 ENCOUNTER — Other Ambulatory Visit: Payer: Self-pay | Admitting: Gastroenterology

## 2020-05-14 ENCOUNTER — Telehealth: Payer: Self-pay | Admitting: Gastroenterology

## 2020-05-14 MED ORDER — PANTOPRAZOLE SODIUM 40 MG PO TBEC: 40.0000 mg | DELAYED_RELEASE_TABLET | Freq: Two times a day (BID) | ORAL | 3 refills | Status: DC

## 2020-05-14 NOTE — Telephone Encounter (Signed)
Patient requesting refill on Protonix 

## 2020-05-14 NOTE — Telephone Encounter (Signed)
Refill sent to pharmacy for protonix. Per Dr. Tarri Glenn last note, pt is to return office as needed.

## 2020-05-23 ENCOUNTER — Other Ambulatory Visit: Payer: Self-pay | Admitting: Physician Assistant

## 2020-05-27 ENCOUNTER — Other Ambulatory Visit: Payer: Self-pay | Admitting: Physician Assistant

## 2020-05-27 MED ORDER — ATORVASTATIN CALCIUM 10 MG PO TABS: 10.0000 mg | ORAL_TABLET | Freq: Every day | ORAL | 0 refills | Status: DC

## 2020-05-27 NOTE — Telephone Encounter (Signed)
Refill sent to requested pharmacy. AS, CMA 

## 2020-05-27 NOTE — Telephone Encounter (Signed)
Patient called states is down to 1 pill of :   atorvastatin (LIPITOR) 10 MG tablet [[id number]]   Order Details Dose, Route, Frequency: As Directed  Dispense Quantity: 90 tablet Refills: 0       Sig: TAKE 1 TABLET BY MOUTH DAILY.   --pt uses :   [place], [place] - [place]  [address] [place] [address]  Phone:  [phone] Fax:  [phone]   -glh

## 2020-05-30 DIAGNOSIS — Z01419 Encounter for gynecological examination (general) (routine) without abnormal findings: Secondary | ICD-10-CM | POA: Diagnosis not present

## 2020-05-30 DIAGNOSIS — Z1231 Encounter for screening mammogram for malignant neoplasm of breast: Secondary | ICD-10-CM | POA: Diagnosis not present

## 2020-05-30 DIAGNOSIS — Z124 Encounter for screening for malignant neoplasm of cervix: Secondary | ICD-10-CM | POA: Diagnosis not present

## 2020-06-06 ENCOUNTER — Ambulatory Visit: Payer: BC Managed Care – PPO | Admitting: Nurse Practitioner

## 2020-06-06 ENCOUNTER — Encounter: Payer: Self-pay | Admitting: Nurse Practitioner

## 2020-06-06 VITALS — BP 96/60 | HR 96 | Ht 66.5 in | Wt 148.2 lb

## 2020-06-06 DIAGNOSIS — K219 Gastro-esophageal reflux disease without esophagitis: Secondary | ICD-10-CM | POA: Diagnosis not present

## 2020-06-06 MED ORDER — FAMOTIDINE 20 MG PO TABS: 20.0000 mg | ORAL_TABLET | Freq: Every day | ORAL | Status: DC

## 2020-06-06 NOTE — Progress Notes (Signed)
06/07/2020 Felicia Good [id number] 06/24/1961   Chief Complaint: Acid reflux   History of Present Illness:  Felicia Good is a 59 year old female with a past medical history of anxiety, depression, migraine headaches, MVP,  liver cyst/hemangioma and mildly elevated LFTs.   She was last seen in our office by Dr. Tarri Glenn 09/15/2018 for further evaluation for abdominal pain and elevated LFTs. She underwent a CCK hida scan 09/30/2018 which was negative. She underwent an EGD 11/01/2018 which showed nonbleeding erosive gastropathy and duodenopathy. No evidence of H. Pylori. Her LFTs normalized.   She presents today for a refill of Nexium 85m which hse takes bid. She is having increased heartburn this summer which attributes to eating more tomatoes.  She has noticed on a few occasions If she lays on her left side at night she has a dull uncomfortable pain which resolves after she turns onto her right side. She feels she might be having esophagea spasms (describes a party is going on in her esophagus, noises in esophagus. No dysphagia. She is passing a normal BM most days, taking Citrucel. No rectal bleeding. She saw her gynecologist last week, exam was normal.    CBC Latest Ref Rng & Units 09/07/2019 02/20/2019 09/08/2018  WBC 3.4 - 10.8 x10E3/uL 5.2 6.2 5.0  Hemoglobin 11.1 - 15.9 g/dL 13.1 13.0 13.2  Hematocrit 34.0 - 46.6 % 38.3 37.6 40.0  Platelets 150 - 450 x10E3/uL 211 228 223    CMP Latest Ref Rng & Units 09/07/2019 04/04/2019 02/20/2019  Glucose 65 - 99 mg/dL 92 - 85  BUN 6 - 24 mg/dL 16 - 22  Creatinine 0.57 - 1.00 mg/dL 0.79 - 0.88  Sodium 134 - 144 mmol/L 141 - 139  Potassium 3.5 - 5.2 mmol/L 4.3 - 4.3  Chloride 96 - 106 mmol/L 105 - 102  CO2 20 - 29 mmol/L 23 - 26  Calcium 8.7 - 10.2 mg/dL 9.6 - 9.5  Total Protein 6.0 - 8.5 g/dL 6.8 6.8 7.1  Total Bilirubin 0.0 - 1.2 mg/dL 0.4 0.3 0.3  Alkaline Phos 39 - 117 IU/L 85 76 73  AST 0 - 40 IU/L '26 29 22  ' ALT 0 - 32 IU/L '29 30 27    ' Labs 11/08/17: A1A 101, ANA neg, AMA neg, IgG 920, HCV AB neg, HBSAg neg, HBcAb IgM neg Labs 08/19/18: AST 28, ALT 35, alk phos 66, TB 0.3 Labs 09/08/18: AST 42, ALT 54, alk phos 61, TB 0.4, lipase 43  Abd/pelvic CT with contrast 09/08/2018: 1. Increased colonic stool burden consistent with constipation. No bowel obstruction or inflammation. 2. 11 mm cyst or hemangioma in the left hepatic lobe. 3. Mild prominence of periuterine vessels which may reflect stigmata of pelvic vascular congestion syndrome  EGD 11/01/2018 Dr. BTarri Glenn  - Normal esophagus. - Non-bleeding erosive gastropathy. Biopsied. - Erythematous duodenopathy. Biopsied. - The examination was otherwise normal. 1. Surgical [P], distal duodenum - ULCERATIVE DUODENITIS. - THERE IS NO EVIDENCE OF VILLOUS ATROPHY, DYSPLASIA OR MALIGNANCY. 2. Surgical [P], random sites gastric - CHRONIC INACTIVE GASTRITIS. - THERE IS NO EVIDENCE OF HELICOBACTER PYLORI, DYSPLASIA OR MALIGNANCY. - SEE COMMENT.  Colonoscopy 03/06/2015: Normal   Current Medications, Allergies, Past Medical History, Past Surgical History, Family History and Social History were reviewed in [employer].  Review of Systems: See HPI, all other systems reviewed and are negative.   Physical Exam: BP 96/60 (BP Location: Left Arm, Patient Position: Sitting, Cuff Size: Normal)  Pulse 96   Ht 5' 6.5" (1.689 m) Comment: height measured without shoes  Wt 148 lb 4 oz (67.2 kg)   LMP 11/26/2013   BMI 23.57 kg/m    Wt Readings from Last 3 Encounters:  06/06/20 148 lb 4 oz (67.2 kg)  03/11/20 148 lb 12.8 oz (67.5 kg)  09/11/19 153 lb (69.4 kg)   General: Well developed 59 year old female in no acute distress. Head: Normocephalic and atraumatic. Eyes: No scleral icterus. Conjunctiva pink . Ears: Normal auditory acuity. Lungs: Clear throughout to auscultation. Heart: Regular rate and rhythm, no murmur. Abdomen: Soft, nontender and  nondistended. Upper aorta palpated without enlargement. No masses or hepatomegaly. Normal bowel sounds x 4 quadrants.  Rectal: Deferred.  Musculoskeletal: Symmetrical with no gross deformities. Extremities: No edema. Neurological: Alert oriented x 4. No focal deficits.  Psychological: Alert and cooperative. Normal mood and affect  Assessment and Recommendations:  73. 59 year old female with GERD -Continue Pantoprazole 17m bid  -Famotidine 227mQHS -GERD diet discussed  -Follow up as needed   2. Colon cancer screening -Next colonoscopy due May 2026  4. History of mildly elevated LFTs. -Repeat LFTs with PCP at time of next physical exam    5. Liver cyst/hemangioma per CTAP 08/2018

## 2020-06-06 NOTE — Patient Instructions (Signed)
If you are age 60 or older, your body mass index should be between 23-30. Your Body mass index is 23.57 kg/m. If this is out of the aforementioned range listed, please consider follow up with your Primary Care Provider.  If you are age 15 or younger, your body mass index should be between 19-25. Your Body mass index is 23.57 kg/m. If this is out of the aformentioned range listed, please consider follow up with your Primary Care Provider.   Please continue Pantoprazole 40 mg 1 tablet by mouth twice a day.  Please purchase the following medications over the counter and take as directed:  START: famotidine 20 mg over-the-counter take 1 tablet by mouth at night.  You are due for colonoscopy in May 2026.  Thank you for entrusting me with your care and choosing [facility].  Carl Best, NP

## 2020-06-11 ENCOUNTER — Telehealth: Payer: Self-pay | Admitting: Gastroenterology

## 2020-06-12 ENCOUNTER — Other Ambulatory Visit: Payer: Self-pay

## 2020-06-12 ENCOUNTER — Ambulatory Visit (INDEPENDENT_AMBULATORY_CARE_PROVIDER_SITE_OTHER): Payer: BC Managed Care – PPO | Admitting: Physician Assistant

## 2020-06-12 ENCOUNTER — Encounter: Payer: Self-pay | Admitting: Physician Assistant

## 2020-06-12 ENCOUNTER — Telehealth: Payer: Self-pay | Admitting: Physician Assistant

## 2020-06-12 VITALS — BP 98/64 | HR 77 | Temp 97.8°F | Ht 66.5 in | Wt 149.2 lb

## 2020-06-12 DIAGNOSIS — F32A Depression, unspecified: Secondary | ICD-10-CM

## 2020-06-12 DIAGNOSIS — F329 Major depressive disorder, single episode, unspecified: Secondary | ICD-10-CM | POA: Diagnosis not present

## 2020-06-12 DIAGNOSIS — F419 Anxiety disorder, unspecified: Secondary | ICD-10-CM | POA: Diagnosis not present

## 2020-06-12 DIAGNOSIS — E785 Hyperlipidemia, unspecified: Secondary | ICD-10-CM

## 2020-06-12 DIAGNOSIS — Z79899 Other long term (current) drug therapy: Secondary | ICD-10-CM | POA: Diagnosis not present

## 2020-06-12 DIAGNOSIS — Z0000 Encounter for general adult medical examination without abnormal findings: Secondary | ICD-10-CM | POA: Diagnosis not present

## 2020-06-12 DIAGNOSIS — R739 Hyperglycemia, unspecified: Secondary | ICD-10-CM

## 2020-06-12 DIAGNOSIS — Z1389 Encounter for screening for other disorder: Secondary | ICD-10-CM

## 2020-06-12 MED ORDER — PANTOPRAZOLE SODIUM 40 MG PO TBEC: 40.0000 mg | DELAYED_RELEASE_TABLET | Freq: Two times a day (BID) | ORAL | 3 refills | Status: DC

## 2020-06-12 MED ORDER — DICYCLOMINE HCL 20 MG PO TABS: 20.0000 mg | ORAL_TABLET | Freq: Three times a day (TID) | ORAL | 1 refills | Status: DC

## 2020-06-12 MED ORDER — ATORVASTATIN CALCIUM 10 MG PO TABS: 10.0000 mg | ORAL_TABLET | Freq: Every day | ORAL | 1 refills | Status: DC

## 2020-06-12 NOTE — Progress Notes (Signed)
Established Patient Office Visit  Subjective:  Patient ID: Felicia Good, female    DOB: 1960/12/31  Age: 59 y.o. MRN: [id number]  CC:  Chief Complaint  Patient presents with   Anxiety    HPI Felicia Good presents for follow-up on mood management. Reports she is doing fine and has no acute concerns today. She did resume Celexa 40 mg. Reports taking 0.5 tablet wasn't helping improve her mood so she resumed full tablet, and can notice a difference. States she takes half tablet of Clonazepam when she has to drive, because she gets very anxious and panicky since she has been staying home for several months now due to the pandemic. As the weather gets cooler, she hopes to resume walking because it does help with her mood. Denies SI/HI.  Past Medical History:  Diagnosis Date   Anxiety    Depression    IBS (irritable bowel syndrome)    Migraine headache    Mitral valve prolapse    Panic attacks    Rectal polyp    RLS (restless legs syndrome)     Past Surgical History:  Procedure Laterality Date   rectal polyp removed  1987   Local anesthesia used   RECTAL POLYPECTOMY      Family History  Problem Relation Age of Onset   Other Father        esophageal problems   Heart failure Father    Cirrhosis Father        Non alcoholic    Stroke Father        Died in 2013-12-17    Hypertension Father    Hypertension Mother    Stroke Mother    Dementia Mother    Breast cancer Maternal Aunt    Colon cancer Neg Hx     Social History   Socioeconomic History   Marital status: Married    Spouse name: [name]   Number of children: 1   Years of education: Not on file   Highest education level: Not on file  Occupational History   Occupation: caregiver  Tobacco Use   Smoking status: Current Every Day Smoker    Packs/day: 0.25    Years: 40.00    Pack years: 10.00    Types: Cigarettes   Smokeless tobacco: Never Used   Tobacco comment: has cut back  Vaping Use    Vaping Use: Never used  Substance and Sexual Activity   Alcohol use: Yes    Alcohol/week: 0.0 standard drinks    Comment: occ   Drug use: No   Sexual activity: Not Currently    Birth control/protection: None  Other Topics Concern   Not on file  Social History Narrative   Not employed    Married    One child       She [place] to be at the farm, walking with dog, she likes to go to [employer].    Social Determinants of Health   Financial Resource Strain:    Difficulty of Paying Living Expenses:   Food Insecurity:    Worried About Charity fundraiser in the Last Year:    Arboriculturist in the Last Year:   Transportation Needs:    Film/video editor (Medical):    Lack of Transportation (Non-Medical):   Physical Activity:    Days of Exercise per Week:    Minutes of Exercise per Session:   Stress:    Feeling of Stress :  Connections:   °• Frequency of Communication with Friends and Family:   °• Frequency of Social Gatherings with Friends and Family:   °• Attends Religious Services:   °• Active Member of Clubs or Organizations:   °• Attends Club or Organization Meetings:   °• Marital Status:   °Intimate Partner Violence:   °• Fear of Current or Ex-Partner:   °• Emotionally Abused:   °• Physically Abused:   °• Sexually Abused:   ° ° °Outpatient Medications Prior to Visit  °Medication Sig Dispense Refill  °• ASPIRIN 81 PO Take by mouth.    °• Cholecalciferol (VITAMIN D3) 2000 units TABS Take 1 tablet by mouth daily.    °• citalopram (CELEXA) 40 MG tablet Take 1 tablet (40 mg total) by mouth daily. 90 tablet 0  °• clonazePAM (KLONOPIN) 0.5 MG tablet Take 1 tablet (0.5 mg total) by mouth at bedtime as needed for anxiety. 30 tablet 0  °• cyclobenzaprine (FLEXERIL) 10 MG tablet Take 1 tablet (10 mg total) by mouth 3 (three) times daily as needed for muscle spasms. 30 tablet 0  °• dicyclomine (BENTYL) 20 MG tablet TAKE 1 TABLET BY MOUTH 3 TIMES A DAY BEFORE MEALS 180  tablet 1  °• famotidine (PEPCID) 20 MG tablet Take 1 tablet (20 mg total) by mouth at bedtime.    °• fluticasone (FLONASE) 50 MCG/ACT nasal spray Place 1 spray into both nostrils 2 (two) times daily as needed for allergies or rhinitis.    °• pantoprazole (PROTONIX) 40 MG tablet Take 1 tablet (40 mg total) by mouth 2 (two) times daily before a meal. 60 tablet 3  °• Prenatal Vit-Fe Fumarate-FA (PRENATAL VITAMIN PO) Take 1 tablet by mouth daily.    °• TRIAMCINOLONE ACETONIDE EX 1 application Topical 2 times daily    °• atorvastatin (LIPITOR) 10 MG tablet Take 1 tablet (10 mg total) by mouth daily. **PATIENT NEEDS APT FOR FURTHER REFILLS** 60 tablet 0  ° °No facility-administered medications prior to visit.  ° ° °No Known Allergies ° °ROS °Review of Systems ° °A fourteen system review of systems was performed and found to be positive as per HPI. ° ° °Objective:  °  °Physical Exam °General:  Well Developed, well nourished, appropriate for stated age.  °Neuro:  Alert and oriented,  extra-ocular muscles intact  °HEENT:  Normocephalic, atraumatic, neck supple °Skin:  no gross rash, warm, pink. °Cardiac:  RRR, S1 S2 °Respiratory:  ECTA B/L and A/P, Not using accessory muscles, speaking in full sentences- unlabored. °Vascular:  Ext warm, no cyanosis apprec.; cap RF less 2 sec. °Psych:  No HI/SI, judgement and insight good, Euthymic mood. Full Affect. ° °BP 98/64    Pulse 77    Temp 97.8 °F (36.6 °C) (Oral)    Ht 5' 6.5" (1.689 m)    Wt 149 lb 3.2 oz (67.7 kg)    LMP 11/26/2013    SpO2 97%    BMI 23.72 kg/m²  °Wt Readings from Last 3 Encounters:  °06/12/20 149 lb 3.2 oz (67.7 kg)  °06/06/20 148 lb 4 oz (67.2 kg)  °03/11/20 148 lb 12.8 oz (67.5 kg)  ° ° ° °Health Maintenance Due  °Topic Date Due  °• HIV Screening  Never done  °• INFLUENZA VACCINE  05/26/2020  ° ° °There are no preventive care reminders to display for this patient. ° °Lab Results  °Component Value Date  ° TSH 2.180 09/07/2019  ° °Lab Results  °Component Value  Date  ° WBC   5.2 09/07/2019  ° HGB 13.1 09/07/2019  ° HCT 38.3 09/07/2019  ° MCV 94 09/07/2019  ° PLT 211 09/07/2019  ° °Lab Results  °Component Value Date  ° NA 141 09/07/2019  ° K 4.3 09/07/2019  ° CO2 23 09/07/2019  ° GLUCOSE 92 09/07/2019  ° BUN 16 09/07/2019  ° CREATININE 0.79 09/07/2019  ° BILITOT 0.4 09/07/2019  ° ALKPHOS 85 09/07/2019  ° AST 26 09/07/2019  ° ALT 29 09/07/2019  ° PROT 6.8 09/07/2019  ° ALBUMIN 4.5 09/07/2019  ° CALCIUM 9.6 09/07/2019  ° ANIONGAP 9 09/08/2018  ° GFR 96.10 11/08/2017  ° °Lab Results  °Component Value Date  ° CHOL 174 09/07/2019  ° °Lab Results  °Component Value Date  ° HDL 67 09/07/2019  ° °Lab Results  °Component Value Date  ° LDLCALC 94 09/07/2019  ° °Lab Results  °Component Value Date  ° TRIG 66 09/07/2019  ° °Lab Results  °Component Value Date  ° CHOLHDL 2.6 09/07/2019  ° °Lab Results  °Component Value Date  ° HGBA1C 5.6 09/07/2019  ° ° °  °Assessment & Plan:  ° °Problem List Items Addressed This Visit   °  ° Other  ° Depression  ° Healthcare maintenance  ° Relevant Orders  ° Comp Met (CMET)  ° Lipid Profile  ° CBC w/Diff  ° TSH  ° HgB A1c  ° Hyperlipidemia  ° Relevant Medications  ° atorvastatin (LIPITOR) 10 MG tablet  ° Other Relevant Orders  ° Comp Met (CMET)  ° Anxiety - Primary  °  °Other Visit Diagnoses   ° On statin therapy      ° Relevant Orders  ° Comp Met (CMET)  ° Blood glucose elevated      ° Relevant Orders  ° HgB A1c  °  ° °Depression, Anxiety: °-Stable, PHQ-9 score of 1 and GAD-7 score of 1 °-Continue current medication regimen. °-Encourage nonpharmacologic therapy such as exercise and mindfulness therapy. °-Will continue to monitor. ° °Healthcare Maintenance: °-Pt is fasting so will collect blood work for CPE. °-Continue to follow heart healthy diet. °-Stay as active as possible.  °-Follow up in 3 months for CPE ° °Meds ordered this encounter  °Medications  °• atorvastatin (LIPITOR) 10 MG tablet  °  Sig: Take 1 tablet (10 mg total) by mouth daily.  °   Dispense:  90 tablet  °  Refill:  1  °  Order Specific Question:   Supervising Provider  °  Answer:   METHENEY, CATHERINE D [[id number]]  ° ° °Follow-up: Return in about 3 months (around 09/12/2020) for CPE .  ° ° ° , PA-C °

## 2020-06-12 NOTE — Addendum Note (Signed)
Addended by: Mickel Crow on: 06/12/2020 09:28 AM   Modules accepted: Orders

## 2020-06-12 NOTE — Telephone Encounter (Signed)
Refills sent to pharmacy. 

## 2020-06-12 NOTE — Telephone Encounter (Signed)
Patient requesting derm referral for yearly skin checks. Please place referral in appropriate

## 2020-06-12 NOTE — Telephone Encounter (Signed)
Referral has been placed per Maritza. AS< CMA 

## 2020-06-12 NOTE — Patient Instructions (Signed)

## 2020-06-13 LAB — COMPREHENSIVE METABOLIC PANEL
ALT: 18 IU/L (ref 0–32)
AST: 18 IU/L (ref 0–40)
Albumin/Globulin Ratio: 2.2 (ref 1.2–2.2)
Albumin: 4.6 g/dL (ref 3.8–4.9)
Alkaline Phosphatase: 77 IU/L (ref 48–121)
BUN/Creatinine Ratio: 22 (ref 12–28)
BUN: 17 mg/dL (ref 8–27)
Bilirubin Total: 0.3 mg/dL (ref 0.0–1.2)
CO2: 24 mmol/L (ref 20–29)
Calcium: 9.7 mg/dL (ref 8.7–10.3)
Chloride: 104 mmol/L (ref 96–106)
Creatinine, Ser: 0.76 mg/dL (ref 0.57–1.00)
GFR calc Af Amer: 99 mL/min/{1.73_m2} (ref >59)
GFR calc non Af Amer: 86 mL/min/{1.73_m2} (ref >59)
Globulin, Total: 2.1 g/dL (ref 1.5–4.5)
Glucose: 84 mg/dL (ref 65–99)
Potassium: 4.1 mmol/L (ref 3.5–5.2)
Sodium: 141 mmol/L (ref 134–144)
Total Protein: 6.7 g/dL (ref 6.0–8.5)

## 2020-06-13 LAB — LIPID PANEL
Chol/HDL Ratio: 3.1 ratio (ref 0.0–4.4)
Cholesterol, Total: 171 mg/dL (ref 100–199)
HDL: 55 mg/dL (ref >39)
LDL Chol Calc (NIH): 100 mg/dL — ABNORMAL HIGH (ref 0–99)
Triglycerides: 84 mg/dL (ref 0–149)
VLDL Cholesterol Cal: 16 mg/dL (ref 5–40)

## 2020-06-13 LAB — CBC WITH DIFFERENTIAL/PLATELET
Basophils Absolute: 0 10*3/uL (ref 0.0–0.2)
Basos: 1 %
EOS (ABSOLUTE): 0.2 10*3/uL (ref 0.0–0.4)
Eos: 4 %
Hematocrit: 38.2 % (ref 34.0–46.6)
Hemoglobin: 12.5 g/dL (ref 11.1–15.9)
Immature Grans (Abs): 0 10*3/uL (ref 0.0–0.1)
Immature Granulocytes: 1 %
Lymphocytes Absolute: 1.9 10*3/uL (ref 0.7–3.1)
Lymphs: 33 %
MCH: 30.9 pg (ref 26.6–33.0)
MCHC: 32.7 g/dL (ref 31.5–35.7)
MCV: 95 fL (ref 79–97)
Monocytes Absolute: 0.5 10*3/uL (ref 0.1–0.9)
Monocytes: 8 %
Neutrophils Absolute: 3.2 10*3/uL (ref 1.4–7.0)
Neutrophils: 53 %
Platelets: 195 10*3/uL (ref 150–450)
RBC: 4.04 x10E6/uL (ref 3.77–5.28)
RDW: 13.1 % (ref 11.7–15.4)
WBC: 5.8 10*3/uL (ref 3.4–10.8)

## 2020-06-13 LAB — HEMOGLOBIN A1C
Est. average glucose Bld gHb Est-mCnc: 114 mg/dL
Hgb A1c MFr Bld: 5.6 % (ref 4.8–5.6)

## 2020-06-13 LAB — TSH: TSH: 3.3 u[IU]/mL (ref 0.450–4.500)

## 2020-06-17 NOTE — Progress Notes (Signed)
Reviewed and agree with management plans. ? ?Jatara Huettner L. Tameka Hoiland, MD, MPH  ?

## 2020-07-02 ENCOUNTER — Telehealth: Payer: Self-pay | Admitting: Physician Assistant

## 2020-07-02 MED ORDER — CITALOPRAM HYDROBROMIDE 40 MG PO TABS: 40.0000 mg | ORAL_TABLET | Freq: Every day | ORAL | 0 refills | Status: DC

## 2020-07-02 NOTE — Telephone Encounter (Signed)
Patient is requesting a refill of her Celexa, if approved please send to [facility]

## 2020-07-02 NOTE — Addendum Note (Signed)
Addended by: Mickel Crow on: 07/02/2020 11:03 AM   Modules accepted: Orders

## 2020-07-02 NOTE — Telephone Encounter (Signed)
Med sent to requested pharmacy. AS, CMA 

## 2020-07-08 ENCOUNTER — Telehealth: Payer: Self-pay | Admitting: Physician Assistant

## 2020-07-08 NOTE — Telephone Encounter (Signed)
Celexa refill sent to pharmacy on 07/02/20. Received receipt from pharmacy same day. AS, CMA

## 2020-07-08 NOTE — Telephone Encounter (Signed)
Patient is requesting a refill of her Celexa, if approved please send to [facility]

## 2020-08-23 ENCOUNTER — Other Ambulatory Visit: Payer: Self-pay

## 2020-08-23 ENCOUNTER — Encounter: Payer: Self-pay | Admitting: Physician Assistant

## 2020-08-23 ENCOUNTER — Ambulatory Visit (INDEPENDENT_AMBULATORY_CARE_PROVIDER_SITE_OTHER): Payer: BC Managed Care – PPO | Admitting: Physician Assistant

## 2020-08-23 VITALS — Ht 67.0 in | Wt 147.0 lb

## 2020-08-23 DIAGNOSIS — J069 Acute upper respiratory infection, unspecified: Secondary | ICD-10-CM | POA: Diagnosis not present

## 2020-08-23 MED ORDER — AMOXICILLIN-POT CLAVULANATE 875-125 MG PO TABS: 1.0000 | ORAL_TABLET | Freq: Two times a day (BID) | ORAL | 0 refills | Status: DC

## 2020-08-23 NOTE — Progress Notes (Signed)
Telehealth office visit note for Felicia Reid, PA-C- at [facility] Care at [facility]   I connected with current patient today by telephone and verified that I am speaking with the correct person    Location of the patient: Home  Location of the provider: Office - This visit type was conducted due to national recommendations for restrictions regarding the COVID-19 Pandemic (e.g. social distancing) in an effort to limit this patient's exposure and mitigate transmission in our community.    - No physical exam could be performed with this format, beyond that communicated to [place] by the patient/ family members as noted.   - Additionally my office staff/ schedulers were to discuss with the patient that there may be a monetary charge related to this service, depending on their medical insurance.  My understanding is that patient understood and consented to proceed.     _________________________________________________________________________________   History of Present Illness: Patient calls in today with upper respiratory symptoms that started at the beginning of the week. Initial symptoms were sneezing and mild cough, yesterday started having sore throat, dysphagia and also complains of nasal congestion, postnasal drainage with green sputum, right ear tinnitus and feels pressure on the right side.  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  Denies shortness of breath or rigors.  Has been taking cough drops, drinking honey tea and warm salt gargles.   GAD 7 : Generalized Anxiety Score 06/12/2020 06/14/2017  Nervous, Anxious, on Edge 1 1  Control/stop worrying 0 3  Worry too much - different things 0 2  Trouble relaxing 0 2  Restless 0 1  Easily annoyed or irritable 0 3  Afraid - awful might happen 0 1  Total GAD 7 Score 1 13  Anxiety Difficulty Not difficult at all Not difficult at all    Depression screen [facility] 2/9 06/12/2020 03/11/2020 09/11/2019  02/13/2019 09/08/2018  Decreased Interest 0 1 1 3 1   Down, Depressed, Hopeless 0 1 1 3 1   PHQ - 2 Score 0 2 2 6 2   Altered sleeping 0 0 1 3 1   Tired, decreased energy 1 1 1 3 1   Change in appetite 0 0 0 0 0  Feeling bad or failure about yourself  0 0 0 0 0  Trouble concentrating 0 0 0 0 0  Moving slowly or fidgety/restless 0 0 0 0 0  Suicidal thoughts 0 0 0 0 0  PHQ-9 Score 1 3 4 12 4   Difficult doing work/chores Not difficult at all Not difficult at all Not difficult at all Not difficult at all Not difficult at all  Some recent data might be hidden      Impression and Recommendations:     1. Upper respiratory tract infection, unspecified type     Upper respiratory tract infection, unspecified type: -Due to progression of symptoms, unilateral pressure, and severity will start Augmentin. -Recommend to continue with home supportive therapy with cough drops, throat lozenges, warm liquids/honey. -Take Tylenol as needed for pain or if a fever develops. -If symptoms fail to improve or worsen advised to let me know.   - As part of my medical decision making, I reviewed the following data within the [place] History obtained from pt /family, CMA notes reviewed and incorporated if applicable, Labs reviewed, Radiograph/ tests reviewed if applicable and OV notes from prior OV's with me, as well as any other specialists she/he has seen since  seeing me last, were all reviewed and used in my medical decision making process today.    - Additionally, when appropriate, discussion had with patient regarding our treatment plan, and their biases/concerns about that plan were used in my medical decision making today.    - The patient agreed with the plan and demonstrated an understanding of the instructions.   No barriers to understanding were identified.     - The patient was advised to call back or seek an in-person evaluation if the symptoms worsen or if the condition fails to  improve as anticipated.   Return if symptoms worsen or fail to improve.    No orders of the defined types were placed in this encounter.   Meds ordered this encounter  Medications   amoxicillin-clavulanate (AUGMENTIN) 875-125 MG tablet    Sig: Take 1 tablet by mouth 2 (two) times daily.    Dispense:  14 tablet    Refill:  0    Order Specific Question:   Supervising Provider    Answer:   Beatrice Lecher D [[id number]]    There are no discontinued medications.     Time spent on visit including pre-visit chart review and post-visit care was 10 minutes.      The [place] was signed into law in 2016 which includes the topic of electronic health records.  This provides immediate access to information in MyChart.  This includes consultation notes, operative notes, office notes, lab results and pathology reports.  If you have any questions about what you read please let us know at your next visit or call us at the office.  We are right here with you.  Note:  This note was prepared with assistance of Dragon voice recognition software. Occasional wrong-word or sound-a-like substitutions may have occurred due to the inherent limitations of voice recognition software.  __________________________________________________________________________________     Patient Care Team    Relationship Specialty Notifications Start End  Felicia Good, [place] PCP - General Physician Assistant  03/11/20   Sanjuana Kava, MD Referring Physician Obstetrics and Gynecology  06/14/17   Dorothyann Peng, NP Nurse Practitioner Family Medicine  09/19/19   Ob/Gyn, [facility]  Obstetrics and Gynecology  09/20/19      -Vitals obtained; medications/ allergies reconciled;  personal medical, social, Sx etc.histories were updated by CMA, reviewed by me and are reflected in chart   Patient Active Problem List   Diagnosis Date Noted   H/O ulcer disease 05/08/2019   Generalized abdominal pain  09/08/2018   Oral pharyngeal candidiasis 08/25/2017   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   atorvastatin (LIPITOR) 10 MG tablet Take 1 tablet (10 mg total) by mouth daily.   Cholecalciferol (VITAMIN D3) 2000 units TABS Take 1 tablet by mouth daily.   citalopram (CELEXA) 40 MG tablet Take 1 tablet (40 mg total) by mouth daily.   clonazePAM (KLONOPIN) 0.5 MG tablet Take 1 tablet (0.5 mg total) by mouth at bedtime as needed for anxiety.   cyclobenzaprine (FLEXERIL) 10 MG tablet Take 1 tablet (10 mg total) by mouth  3 (three) times daily as needed for muscle spasms.   dicyclomine (BENTYL) 20 MG tablet Take 1 tablet (20 mg total) by mouth 3 (three) times daily before meals.   famotidine (PEPCID) 20 MG tablet Take 1 tablet (20 mg total) by mouth at bedtime.   fluticasone (FLONASE) 50 MCG/ACT nasal spray Place 1 spray into both nostrils 2 (two) times daily as needed for allergies or rhinitis.   Prenatal Vit-Fe Fumarate-FA (PRENATAL VITAMIN PO) Take 1 tablet by mouth daily.   TRIAMCINOLONE ACETONIDE EX 1 application Topical 2 times daily     Allergies:  No Known Allergies   ROS:  See above HPI for pertinent positives and negatives   Objective:   Height 5\' 7"  (1.702 m), weight 147 lb (66.7 kg), last menstrual period 11/26/2013.  (if some vitals are omitted, this means that patient was UNABLE to obtain them even though they were asked to get them prior to OV today.  They were asked to call us at their earliest convenience with these once obtained. ) General: A & O * 3; sounds in  no acute distress; sounds congested  Respiratory: speaking in full sentences, no conversational dyspnea;  Psych: insight appears good, mood- appears full

## 2020-09-10 ENCOUNTER — Other Ambulatory Visit: Payer: Self-pay

## 2020-09-10 ENCOUNTER — Ambulatory Visit (INDEPENDENT_AMBULATORY_CARE_PROVIDER_SITE_OTHER): Payer: BC Managed Care – PPO

## 2020-09-10 DIAGNOSIS — Z23 Encounter for immunization: Secondary | ICD-10-CM | POA: Diagnosis not present

## 2020-09-10 NOTE — Progress Notes (Signed)
Pt here for influenza vaccine.  Screening questionnaire reviewed, VIS provided to patient, and any/all patient questions answered.  T. Canuto Kingston, CMA  

## 2020-09-25 ENCOUNTER — Ambulatory Visit (INDEPENDENT_AMBULATORY_CARE_PROVIDER_SITE_OTHER): Payer: BC Managed Care – PPO | Admitting: Physician Assistant

## 2020-09-25 ENCOUNTER — Encounter: Payer: Self-pay | Admitting: Physician Assistant

## 2020-09-25 ENCOUNTER — Other Ambulatory Visit: Payer: Self-pay

## 2020-09-25 VITALS — BP 79/50 | HR 76 | Ht 67.0 in | Wt 141.8 lb

## 2020-09-25 DIAGNOSIS — Z0000 Encounter for general adult medical examination without abnormal findings: Secondary | ICD-10-CM | POA: Diagnosis not present

## 2020-09-25 DIAGNOSIS — E785 Hyperlipidemia, unspecified: Secondary | ICD-10-CM

## 2020-09-25 NOTE — Patient Instructions (Signed)
Preventive Care 40-60 Years Old, Female °Preventive care refers to visits with your health care provider and lifestyle choices that can promote health and wellness. This includes: °· A yearly physical exam. This may also be called an annual well check. °· Regular dental visits and eye exams. °· Immunizations. °· Screening for certain conditions. °· Healthy lifestyle choices, such as eating a healthy diet, getting regular exercise, not using drugs or products that contain nicotine and tobacco, and limiting alcohol use. °What can I expect for my preventive care visit? °Physical exam °Your health care provider will check your: °· Height and weight. This may be used to calculate body mass index (BMI), which tells if you are at a healthy weight. °· Heart rate and blood pressure. °· Skin for abnormal spots. °Counseling °Your health care provider may ask you questions about your: °· Alcohol, tobacco, and drug use. °· Emotional well-being. °· Home and relationship well-being. °· Sexual activity. °· Eating habits. °· Work and work environment. °· Method of birth control. °· Menstrual cycle. °· Pregnancy history. °What immunizations do I need? ° °Influenza (flu) vaccine °· This is recommended every year. °Tetanus, diphtheria, and pertussis (Tdap) vaccine °· You may need a Td booster every 10 years. °Varicella (chickenpox) vaccine °· You may need this if you have not been vaccinated. °Zoster (shingles) vaccine °· You may need this after age 60. °Measles, mumps, and rubella (MMR) vaccine °· You may need at least one dose of MMR if you were born in 1957 or later. You may also need a second dose. °Pneumococcal conjugate (PCV13) vaccine °· You may need this if you have certain conditions and were not previously vaccinated. °Pneumococcal polysaccharide (PPSV23) vaccine °· You may need one or two doses if you smoke cigarettes or if you have certain conditions. °Meningococcal conjugate (MenACWY) vaccine °· You may need this if you  have certain conditions. °Hepatitis A vaccine °· You may need this if you have certain conditions or if you travel or work in places where you may be exposed to hepatitis A. °Hepatitis B vaccine °· You may need this if you have certain conditions or if you travel or work in places where you may be exposed to hepatitis B. °Haemophilus influenzae type b (Hib) vaccine °· You may need this if you have certain conditions. °Human papillomavirus (HPV) vaccine °· If recommended by your health care provider, you may need three doses over 6 months. °You may receive vaccines as individual doses or as more than one vaccine together in one shot (combination vaccines). Talk with your health care provider about the risks and benefits of combination vaccines. °What tests do I need? °Blood tests °· Lipid and cholesterol levels. These may be checked every 5 years, or more frequently if you are over 50 years old. °· Hepatitis C test. °· Hepatitis B test. °Screening °· Lung cancer screening. You may have this screening every year starting at age 55 if you have a 30-pack-year history of smoking and currently smoke or have quit within the past 15 years. °· Colorectal cancer screening. All adults should have this screening starting at age 50 and continuing until age 75. Your health care provider may recommend screening at age 45 if you are at increased risk. You will have tests every 1-10 years, depending on your results and the type of screening test. °· Diabetes screening. This is done by checking your blood sugar (glucose) after you have not eaten for a while (fasting). You may have this   done every 1-3 years.  Mammogram. This may be done every 1-2 years. Talk with your health care provider about when you should start having regular mammograms. This may depend on whether you have a family history of breast cancer.  BRCA-related cancer screening. This may be done if you have a family history of breast, ovarian, tubal, or peritoneal  cancers.  Pelvic exam and Pap test. This may be done every 3 years starting at age 76. Starting at age 89, this may be done every 5 years if you have a Pap test in combination with an HPV test. Other tests  Sexually transmitted disease (STD) testing.  Bone density scan. This is done to screen for osteoporosis. You may have this scan if you are at high risk for osteoporosis. Follow these instructions at home: Eating and drinking  Eat a diet that includes fresh fruits and vegetables, whole grains, lean protein, and low-fat dairy.  Take vitamin and mineral supplements as recommended by your health care provider.  Do not drink alcohol if: ? Your health care provider tells you not to drink. ? You are pregnant, may be pregnant, or are planning to become pregnant.  If you drink alcohol: ? Limit how much you have to 0-1 drink a day. ? Be aware of how much alcohol is in your drink. In the [place] one drink equals one 12 oz bottle of beer (355 mL), one 5 oz glass of wine (148 mL), or one 1 oz glass of hard liquor (44 mL). Lifestyle  Take daily care of your teeth and gums.  Stay active. Exercise for at least 30 minutes on 5 or more days each week.  Do not use any products that contain nicotine or tobacco, such as cigarettes, e-cigarettes, and chewing tobacco. If you need help quitting, ask your health care provider.  If you are sexually active, practice safe sex. Use a condom or other form of birth control (contraception) in order to prevent pregnancy and STIs (sexually transmitted infections).  If told by your health care provider, take low-dose aspirin daily starting at age 37. What's next?  Visit your health care provider once a year for a well check visit.  Ask your health care provider how often you should have your eyes and teeth checked.  Stay up to date on all vaccines. This information is not intended to replace advice given to you by your health care provider. Make sure you  discuss any questions you have with your health care provider. Document Revised: 06/23/2018 Document Reviewed: 06/23/2018 Elsevier Patient Education  2020 [employer].

## 2020-09-25 NOTE — Progress Notes (Signed)
Female Physical   Impression and Recommendations:    1. Healthcare maintenance   2. Hyperlipidemia, unspecified hyperlipidemia type      1) Anticipatory Guidance: Skin CA prevention- recommend to use sunscreen when outside along with skin surveillance; eating a balanced and modest diet; physical activity at least 25 minutes per day or minimum of 150 min/ week moderate to intense activity.  2) Immunizations / Screenings / Labs:   All immunizations are up-to-date per recommendations or will be updated today if pt allows.    - Patient understands with dental and vision screens they will schedule independently.  - UTD on blood work, Pap smear, colonoscopy, Tdap and influenza vaccine.  Has completed Shingrix series. - Will request most recent mammogram from [facility] hep C and HIV screenings.  3) Weight:  Continue to improve diet habits to improve overall feelings of well being and objective health data. Improve nutrient density of diet through increasing intake of fruits and vegetables and decreasing saturated fats, white flour products and refined sugars. -Patient has lost 6 pounds since last visit.  4) Healthcare maintenance: -Continue current medication regimen. -Follow a heart healthy diet and stay as active as possible.  Continue to stay well-hydrated. -Encourage to continue to work on reducing tobacco use. -Recommend dermatology referral for lesions underneath tongue. Patient has upcoming dental appointment next week and plans to inquire dentist about lesions. -Follow-up in 4 months for mood, HLD (repeat lipid panel and CMP)    No orders of the defined types were placed in this encounter.   No orders of the defined types were placed in this encounter.    Return in about 4 months (around 01/24/2021) for Mood, HLD (repeat lipid, cmp).      Gross side effects, risk and benefits, and alternatives of medications discussed with patient.  Patient is aware  that all medications have potential side effects and we are unable to predict every side effect or drug-drug interaction that may occur.  Expresses verbal understanding and consents to current therapy plan and treatment regimen.  F-up preventative CPE in 1 year-this is in addition to any chronic care visits.    Please see orders placed and AVS handed out to patient at the end of our visit for further patient instructions/ counseling done pertaining to today's office visit.  Note:  This note was prepared with assistance of Dragon voice recognition software. Occasional wrong-word or sound-a-like substitutions may have occurred due to the inherent limitations of voice recognition software.   Subjective:     CPE HPI: Felicia Good is a 59 y.o. female who presents to [place] [facility] today for a yearly health maintenance exam.   Health Maintenance Summary  - Reviewed and updated, unless pt declines services.  Last Cologuard or Colonoscopy: 03/06/2015- repeat in 10 years Tobacco History Reviewed:  Y, current smoker Alcohol and/or drug use:    No concerns; no use Dental Home:Y Eye exams:Y Female Health:  PAP Smear - last known results: 05/08/19- normal (followed by Ob-Gyn) STD concerns:   none, monogamous Menses regular: postmenopausal  Lumps or breast concerns:  none      Additional concerns beyond health maintenance issues: none    Immunization History  Administered Date(s) Administered   Influenza Split 09/02/2011, 07/07/2012   Influenza Whole 08/15/2007, 09/08/2010   Influenza,inj,Quad PF,6+ Mos 08/11/2013, 06/19/2014, 07/09/2015, 07/09/2016, 07/26/2017, 07/28/2018, 09/11/2019, 09/10/2020   Influenza-Unspecified 07/29/2016   PFIZER SARS-COV-2 Vaccination 01/18/2020, 02/12/2020  Td 10/26/2000   Tdap 10/06/2011   Zoster Recombinat (Shingrix) 08/19/2018, 10/20/2018     Health Maintenance  Topic Date Due   HIV Screening  Never done    MAMMOGRAM  05/07/2021   TETANUS/TDAP  10/05/2021   PAP SMEAR-Modifier  05/07/2022   COLONOSCOPY  03/05/2025   INFLUENZA VACCINE  Completed   COVID-19 Vaccine  Completed   Hepatitis C Screening  Completed     Wt Readings from Last 3 Encounters:  09/25/20 141 lb 12.8 oz (64.3 kg)  08/23/20 147 lb (66.7 kg)  06/12/20 149 lb 3.2 oz (67.7 kg)   BP Readings from Last 3 Encounters:  09/25/20 (!) 79/50  06/12/20 98/64  06/06/20 96/60   Pulse Readings from Last 3 Encounters:  09/25/20 76  06/12/20 77  06/06/20 96     Past Medical History:  Diagnosis Date   Anxiety    Depression    IBS (irritable bowel syndrome)    Migraine headache    Mitral valve prolapse    Panic attacks    Rectal polyp    RLS (restless legs syndrome)       Past Surgical History:  Procedure Laterality Date   rectal polyp removed  1987   Local anesthesia used   RECTAL POLYPECTOMY        Family History  Problem Relation Age of Onset   Other Father        esophageal problems   Heart failure Father    Cirrhosis Father        Non alcoholic    Stroke Father        Died in Jan 26, 2014    Hypertension Father    Hypertension Mother    Stroke Mother    Dementia Mother    Breast cancer Maternal Aunt    Colon cancer Neg Hx       Social History   Substance and Sexual Activity  Drug Use No  ,   Social History   Substance and Sexual Activity  Alcohol Use Yes   Alcohol/week: 0.0 standard drinks   Comment: occ  ,   Social History   Tobacco Use  Smoking Status Current Every Day Smoker   Packs/day: 0.25   Years: 40.00   Pack years: 10.00   Types: Cigarettes  Smokeless Tobacco Never Used  Tobacco Comment   has cut back  ,   Social History   Substance and Sexual Activity  Sexual Activity Not Currently   Birth control/protection: None    Current Outpatient Medications on File Prior to Visit  Medication Sig Dispense Refill   ASPIRIN 81 PO Take by  mouth.     atorvastatin (LIPITOR) 10 MG tablet Take 1 tablet (10 mg total) by mouth daily. 90 tablet 1   Cholecalciferol (VITAMIN D3) 2000 units TABS Take 1 tablet by mouth daily.     citalopram (CELEXA) 40 MG tablet Take 1 tablet (40 mg total) by mouth daily. 90 tablet 0   clonazePAM (KLONOPIN) 0.5 MG tablet Take 1 tablet (0.5 mg total) by mouth at bedtime as needed for anxiety. 30 tablet 0   cyclobenzaprine (FLEXERIL) 10 MG tablet Take 1 tablet (10 mg total) by mouth 3 (three) times daily as needed for muscle spasms. 30 tablet 0   dicyclomine (BENTYL) 20 MG tablet Take 1 tablet (20 mg total) by mouth 3 (three) times daily before meals. 180 tablet 1   famotidine (PEPCID) 20 MG tablet Take 1 tablet (20 mg total) by mouth at  bedtime.     fluticasone (FLONASE) 50 MCG/ACT nasal spray Place 1 spray into both nostrils 2 (two) times daily as needed for allergies or rhinitis.     Prenatal Vit-Fe Fumarate-FA (PRENATAL VITAMIN PO) Take 1 tablet by mouth daily.     TRIAMCINOLONE ACETONIDE EX 1 application Topical 2 times daily     pantoprazole (PROTONIX) 40 MG tablet Take 1 tablet (40 mg total) by mouth 2 (two) times daily before a meal. 60 tablet 3   No current facility-administered medications on file prior to visit.    Allergies: Patient has no known allergies.  Review of Systems: General:   Denies fever, chills, unexplained weight loss.  Optho/Auditory:   Denies visual changes, blurred vision/LOV Respiratory:   Denies SOB, DOE more than baseline levels.   Cardiovascular:   Denies chest pain, palpitations, new onset peripheral edema  Gastrointestinal:   Denies nausea, vomiting, diarrhea.  Genitourinary: Denies dysuria, freq/ urgency, flank pain Endocrine:     Denies hot or cold intolerance, polyuria, polydipsia. Musculoskeletal:   Denies unexplained myalgias, joint swelling, unexplained arthralgias, gait problems.  Skin:  Denies rash, + red lesions underneath tongue Neurological:      Denies dizziness, unexplained weakness, numbness  Psychiatric/Behavioral:   Denies mood changes, suicidal or homicidal ideations, hallucinations    Objective:    Blood pressure (!) 79/50, pulse 76, height 5\' 7"  (1.702 m), weight 141 lb 12.8 oz (64.3 kg), last menstrual period 11/26/2013, SpO2 98 %. Body mass index is 22.21 kg/m. General Appearance:    Alert and pleasant, cooperative, no distress, appears stated age  Head:    Normocephalic, without obvious abnormality, atraumatic  Eyes:    PERRL, conjunctiva/corneas clear, EOM's intact, both eyes  Ears:    Normal TM's and external ear canals, both ears  Nose:   Nares normal, septum midline, mucosa normal, no drainage    or sinus tenderness  Throat:   Lips w/o lesion, mucosa moist, and tongue normal; teeth and   gums normal; small raised spots noted near lingual frenulum  Neck:   Supple, symmetrical, trachea midline, no adenopathy;    thyroid:  no enlargement/tenderness/nodules; no carotid   bruit or JVD  Back:     Symmetric, no curvature, ROM normal, no CVA tenderness  Lungs:     Clear to auscultation bilaterally, respirations unlabored, no       Wh/ R/ R  Chest Wall:    No tenderness or gross deformity; normal excursion   Heart:    Regular rate and rhythm, S1 and S2 normal, no murmur, rub   or gallop  Breast Exam:   Deferred to Ob-Gyn  Abdomen:     Soft, non-tender, bowel sounds active all four quadrants, No   G/R/R, no masses, no organomegaly  Genitalia:   Deferred to Ob-Gyn  Rectal:   Deferred to Ob-Gyn  Extremities:   Extremities normal, atraumatic, no cyanosis or gross edema  Pulses:   2+ and symmetric all extremities  Skin:   Warm, dry, Skin color, texture, turgor normal, no obvious rashes or lesions Psych: No HI/SI, judgement and insight good, Euthymic mood. Full Affect.  Neurologic:   CNII-XII grossly intact, normal strength, sensation and reflexes throughout

## 2020-10-07 ENCOUNTER — Ambulatory Visit: Payer: BC Managed Care – PPO | Admitting: Dermatology

## 2020-10-07 ENCOUNTER — Other Ambulatory Visit: Payer: Self-pay

## 2020-10-07 DIAGNOSIS — L578 Other skin changes due to chronic exposure to nonionizing radiation: Secondary | ICD-10-CM

## 2020-10-07 DIAGNOSIS — L570 Actinic keratosis: Secondary | ICD-10-CM

## 2020-10-07 DIAGNOSIS — L821 Other seborrheic keratosis: Secondary | ICD-10-CM | POA: Diagnosis not present

## 2020-10-07 DIAGNOSIS — L820 Inflamed seborrheic keratosis: Secondary | ICD-10-CM

## 2020-10-07 NOTE — Patient Instructions (Signed)

## 2020-10-07 NOTE — Progress Notes (Signed)
   New Patient Visit  Subjective  Felicia Good is a 59 y.o. female who presents for the following: Other (Spots of face, arms and legs that get scaly.).  Accompanied by mother  The following portions of the chart were reviewed this encounter and updated as appropriate:   Tobacco  Allergies  Meds  Problems  Med Hx  Surg Hx  Fam Hx     Review of Systems:  No other skin or systemic complaints except as noted in HPI or Assessment and Plan.  Objective  Well appearing patient in no apparent distress; mood and affect are within normal limits.  A focused examination was performed including face, arms. Relevant physical exam findings are noted in the Assessment and Plan.  Objective  Left temple: Erythematous thin papules/macules with gritty scale.   Objective  Face, arms: Stuck-on, waxy, tan-brown papule or plaque --Discussed benign etiology and prognosis.   Objective  Right arm x 4, left hand x 1 (5): Erythematous keratotic or waxy stuck-on papule or plaque.    Assessment & Plan    Actinic Damage - chronic, secondary to cumulative UV radiation exposure/sun exposure over time - diffuse scaly erythematous macules with underlying dyspigmentation - Recommend daily broad spectrum sunscreen SPF 30+ to sun-exposed areas, reapply every 2 hours as needed.  - Call for new or changing lesions.  AK (actinic keratosis) Left temple  RTC if not resolved after 6 weeks.  Destruction of lesion - Left temple Complexity: simple   Destruction method: cryotherapy   Informed consent: discussed and consent obtained   Timeout:  patient name, date of birth, surgical site, and procedure verified Lesion destroyed using liquid nitrogen: Yes   Region frozen until ice ball extended beyond lesion: Yes   Outcome: patient tolerated procedure well with no complications   Post-procedure details: wound care instructions given    Seborrheic keratosis Face, arms  Discussed LN2. Advised patient fee  is $60 for first lesion and $15 each additional.  Inflamed seborrheic keratosis (5) Right arm x 4, left hand x 1  Destruction of lesion - Right arm x 4, left hand x 1 Complexity: simple   Destruction method: cryotherapy   Informed consent: discussed and consent obtained   Timeout:  patient name, date of birth, surgical site, and procedure verified Lesion destroyed using liquid nitrogen: Yes   Region frozen until ice ball extended beyond lesion: Yes   Outcome: patient tolerated procedure well with no complications   Post-procedure details: wound care instructions given    Return in about 1 year (around 10/07/2021).  I, Ashok Cordia, CMA, am acting as scribe for Sarina Ser, MD .  Documentation: I have reviewed the above documentation for accuracy and completeness, and I agree with the above.  Sarina Ser, MD

## 2020-10-10 ENCOUNTER — Encounter: Payer: Self-pay | Admitting: Dermatology

## 2020-12-09 ENCOUNTER — Telehealth: Payer: Self-pay | Admitting: Physician Assistant

## 2020-12-09 NOTE — Telephone Encounter (Signed)
Patient's daughter Nira Conn) is coming to pick up a urine sample cup for patient. Documentation only.

## 2020-12-11 ENCOUNTER — Other Ambulatory Visit: Payer: Self-pay | Admitting: Gastroenterology

## 2021-01-15 ENCOUNTER — Other Ambulatory Visit: Payer: Self-pay | Admitting: Gastroenterology

## 2021-01-24 ENCOUNTER — Ambulatory Visit: Payer: BC Managed Care – PPO | Admitting: Physician Assistant

## 2021-01-29 ENCOUNTER — Other Ambulatory Visit: Payer: Self-pay | Admitting: Physician Assistant

## 2021-01-29 DIAGNOSIS — E785 Hyperlipidemia, unspecified: Secondary | ICD-10-CM

## 2021-01-30 ENCOUNTER — Encounter: Payer: Self-pay | Admitting: Physician Assistant

## 2021-01-30 ENCOUNTER — Other Ambulatory Visit: Payer: Self-pay

## 2021-01-30 ENCOUNTER — Ambulatory Visit: Payer: BC Managed Care – PPO | Admitting: Physician Assistant

## 2021-01-30 VITALS — BP 92/63 | HR 93 | Temp 98.0°F | Ht 67.0 in | Wt 141.3 lb

## 2021-01-30 DIAGNOSIS — M545 Low back pain, unspecified: Secondary | ICD-10-CM

## 2021-01-30 DIAGNOSIS — G8929 Other chronic pain: Secondary | ICD-10-CM

## 2021-01-30 DIAGNOSIS — E785 Hyperlipidemia, unspecified: Secondary | ICD-10-CM | POA: Diagnosis not present

## 2021-01-30 DIAGNOSIS — F419 Anxiety disorder, unspecified: Secondary | ICD-10-CM | POA: Diagnosis not present

## 2021-01-30 DIAGNOSIS — F410 Panic disorder [episodic paroxysmal anxiety] without agoraphobia: Secondary | ICD-10-CM

## 2021-01-30 DIAGNOSIS — F32A Depression, unspecified: Secondary | ICD-10-CM | POA: Diagnosis not present

## 2021-01-30 MED ORDER — CLONAZEPAM 0.5 MG PO TABS
0.5000 mg | ORAL_TABLET | Freq: Every evening | ORAL | 0 refills | Status: DC | PRN
Start: 2021-01-30 — End: 2021-09-03

## 2021-01-30 MED ORDER — CYCLOBENZAPRINE HCL 10 MG PO TABS: 10.0000 mg | ORAL_TABLET | Freq: Three times a day (TID) | ORAL | 0 refills | Status: DC | PRN

## 2021-01-30 MED ORDER — CITALOPRAM HYDROBROMIDE 40 MG PO TABS
40.0000 mg | ORAL_TABLET | Freq: Every day | ORAL | 0 refills | Status: DC
Start: 2021-01-30 — End: 2021-06-02

## 2021-01-30 NOTE — Patient Instructions (Signed)

## 2021-01-30 NOTE — Progress Notes (Signed)
Established Patient Office Visit  Subjective:  Patient ID: Felicia Good, female    DOB: [birth year]  Age: 60 y.o. MRN: [id number]  CC:  Chief Complaint  Patient presents with  . Depression    HPI Felicia Good presents for follow up on mood management. Patient reports increased personal stress. Taking medication as directed without issues. Continues to walk with her dog which helps with mental health. When feeling anxious does deep  breathing exercises. Reports when feeling jittery and has upset stomach will take clonazepam to help calm her down.   Back pain: States occasionally will take muscle relaxer to help with back pain. Has tried physical therapy in the past.   HLD: Pt taking medication as directed without issues. Continues to watch her diet.     Past Medical History:  Diagnosis Date  . Anxiety   . Depression   . IBS (irritable bowel syndrome)   . Migraine headache   . Mitral valve prolapse   . Panic attacks   . Rectal polyp   . RLS (restless legs syndrome)     Past Surgical History:  Procedure Laterality Date  . rectal polyp removed  1987   Local anesthesia used  . RECTAL POLYPECTOMY      Family History  Problem Relation Age of Onset  . Other Father        esophageal problems  . Heart failure Father   . Cirrhosis Father        Non alcoholic   . Stroke Father        Died in 41   . Hypertension Father   . Hypertension Mother   . Stroke Mother   . Dementia Mother   . Breast cancer Maternal Aunt   . Colon cancer Neg Hx     Social History   Socioeconomic History  . Marital status: Married    Spouse name: [name]  . Number of children: 1  . Years of education: Not on file  . Highest education level: Not on file  Occupational History  . Occupation: caregiver  Tobacco Use  . Smoking status: Current Every Day Smoker    Packs/day: 0.25    Years: 40.00    Pack years: 10.00    Types: Cigarettes  . Smokeless tobacco: Never Used  . Tobacco comment:  has cut back  Vaping Use  . Vaping Use: Never used  Substance and Sexual Activity  . Alcohol use: Yes    Alcohol/week: 0.0 standard drinks    Comment: occ  . Drug use: No  . Sexual activity: Not Currently    Birth control/protection: None  Other Topics Concern  . Not on file  Social History Narrative   Not employed    Married    One child       [place], walking with dog, she likes to go to [employer].    Social Determinants of Health   Financial Resource Strain: Not on file  Food Insecurity: Not on file  Transportation Needs: Not on file  Physical Activity: Not on file  Stress: Not on file  Social Connections: Not on file  Intimate Partner Violence: Not on file    Outpatient Medications Prior to Visit  Medication Sig Dispense Refill  . ASPIRIN 81 PO Take by mouth.    Marland Kitchen atorvastatin (LIPITOR) 10 MG tablet TAKE 1 TABLET (10 MG TOTAL) BY MOUTH DAILY. 90 tablet 1  . Cholecalciferol (VITAMIN D3) 2000 units  TABS Take 1 tablet by mouth daily.    Marland Kitchen dicyclomine (BENTYL) 20 MG tablet TAKE 1 TABLET BY MOUTH 3 TIMES DAILY BEFORE MEALS. 180 tablet 1  . famotidine (PEPCID) 20 MG tablet Take 1 tablet (20 mg total) by mouth at bedtime.    . fluticasone (FLONASE) 50 MCG/ACT nasal spray Place 1 spray into both nostrils 2 (two) times daily as needed for allergies or rhinitis.    . pantoprazole (PROTONIX) 40 MG tablet TAKE 1 TABLET BY MOUTH 2 TIMES DAILY BEFORE A MEAL. 60 tablet 3  . Prenatal Vit-Fe Fumarate-FA (PRENATAL VITAMIN PO) Take 1 tablet by mouth daily.    . TRIAMCINOLONE ACETONIDE EX 1 application Topical 2 times daily    . citalopram (CELEXA) 40 MG tablet Take 1 tablet (40 mg total) by mouth daily. 90 tablet 0  . clonazePAM (KLONOPIN) 0.5 MG tablet Take 1 tablet (0.5 mg total) by mouth at bedtime as needed for anxiety. 30 tablet 0  . cyclobenzaprine (FLEXERIL) 10 MG tablet Take 1 tablet (10 mg total) by mouth 3 (three) times daily as needed for muscle spasms.  30 tablet 0   No facility-administered medications prior to visit.    No Known Allergies  ROS Review of Systems Review of Systems:  A fourteen system review of systems was performed and found to be positive as per HPI.   Objective:    Physical Exam General:  Well Developed, well nourished, in no acute distress  Neuro:  Alert and oriented,  extra-ocular muscles intact  HEENT:  Normocephalic, atraumatic, neck supple Skin:  no gross rash, warm, pink. Cardiac:  RRR, S1 S2 wnl's, no murmur  Respiratory:  ECTA B/L and A/P, Not using accessory muscles, speaking in full sentences- unlabored. Vascular:  Ext warm, no cyanosis apprec.; cap RF less 2 sec. Psych:  No HI/SI, judgement and insight good, Euthymic mood. Full Affect.   BP 92/63   Pulse 93   Temp 98 F (36.7 C)   Ht 5\' 7"  (1.702 m)   Wt 141 lb 4.8 oz (64.1 kg)   LMP 11/26/2013   SpO2 97%   BMI 22.13 kg/m  Wt Readings from Last 3 Encounters:  01/30/21 141 lb 4.8 oz (64.1 kg)  09/25/20 141 lb 12.8 oz (64.3 kg)  08/23/20 147 lb (66.7 kg)     Health Maintenance Due  Topic Date Due  . HIV Screening  Never done  . COVID-19 Vaccine (3 - Pfizer risk 4-dose series) 03/11/2020    There are no preventive care reminders to display for this patient.  Lab Results  Component Value Date   TSH 3.300 06/12/2020   Lab Results  Component Value Date   WBC 5.8 06/12/2020   HGB 12.5 06/12/2020   HCT 38.2 06/12/2020   MCV 95 06/12/2020   PLT 195 06/12/2020   Lab Results  Component Value Date   NA 141 06/12/2020   K 4.1 06/12/2020   CO2 24 06/12/2020   GLUCOSE 84 06/12/2020   BUN 17 06/12/2020   CREATININE 0.76 06/12/2020   BILITOT 0.3 06/12/2020   ALKPHOS 77 06/12/2020   AST 18 06/12/2020   ALT 18 06/12/2020   PROT 6.7 06/12/2020   ALBUMIN 4.6 06/12/2020   CALCIUM 9.7 06/12/2020   ANIONGAP 9 09/08/2018   GFR 96.10 11/08/2017   Lab Results  Component Value Date   CHOL 171 06/12/2020   Lab Results  Component  Value Date   HDL 55 06/12/2020   Lab Results  Component Value Date   LDLCALC 100 (H) 06/12/2020   Lab Results  Component Value Date   TRIG 84 06/12/2020   Lab Results  Component Value Date   CHOLHDL 3.1 06/12/2020   Lab Results  Component Value Date   HGBA1C 5.6 06/12/2020      Assessment & Plan:   Problem List Items Addressed This Visit      Other   Depression   Relevant Medications   citalopram (CELEXA) 40 MG tablet   Back pain   Relevant Medications   cyclobenzaprine (FLEXERIL) 10 MG tablet   PANIC DISORDER,NO AGORAPHOBIA   Relevant Medications   citalopram (CELEXA) 40 MG tablet   clonazePAM (KLONOPIN) 0.5 MG tablet   Hyperlipidemia - Primary    -Last lipid panel essentially wnl with mild elevation of LDL at 100. -Continue current medication regimen. Last hepatic function normal. -Will continue to monitor.      Anxiety   Relevant Medications   citalopram (CELEXA) 40 MG tablet     Depression, Anxiety, Panic disorder: -PHQ-9 score of 1. Denies SI/HI. -PDMP reviewed, clonazepam last filled 03/11/2020 for Q: 30. Provided refill. -Continue Citalopram 40 mg. -Continue mindfulness therapy and daily walks. -Will continue to monitor.  Back pain: -Stable. -Continue to use flexeril as needed for back pain. Provided refill. -Recommend to continue with stretches and exercises.     Meds ordered this encounter  Medications  . citalopram (CELEXA) 40 MG tablet    Sig: Take 1 tablet (40 mg total) by mouth daily.    Dispense:  90 tablet    Refill:  0    Order Specific Question:   Supervising Provider    Answer:   Beatrice Lecher D [[id number]]  . clonazePAM (KLONOPIN) 0.5 MG tablet    Sig: Take 1 tablet (0.5 mg total) by mouth at bedtime as needed for anxiety.    Dispense:  30 tablet    Refill:  0    Order Specific Question:   Supervising Provider    Answer:   Beatrice Lecher D [[id number]]  . cyclobenzaprine (FLEXERIL) 10 MG tablet    Sig: Take 1 tablet (10  mg total) by mouth 3 (three) times daily as needed for muscle spasms.    Dispense:  30 tablet    Refill:  0    Order Specific Question:   Supervising Provider    Answer:   Beatrice Lecher D [[id number]]    Follow-up: Return in about 4 months (around 06/01/2021) for HLD, mood.    Lorrene Reid, PA-C

## 2021-01-30 NOTE — Assessment & Plan Note (Signed)
-  Last lipid panel essentially wnl with mild elevation of LDL at 100. -Continue current medication regimen. Last hepatic function normal. -Will continue to monitor.

## 2021-05-15 ENCOUNTER — Other Ambulatory Visit: Payer: Self-pay | Admitting: Physician Assistant

## 2021-05-15 NOTE — Telephone Encounter (Signed)
Need office visit prior to additional refills.

## 2021-05-15 NOTE — Telephone Encounter (Signed)
Per Dr. Tarri Glenn, pt will require an appt for any future refill requests

## 2021-06-02 ENCOUNTER — Ambulatory Visit (INDEPENDENT_AMBULATORY_CARE_PROVIDER_SITE_OTHER): Payer: BC Managed Care – PPO | Admitting: Physician Assistant

## 2021-06-02 ENCOUNTER — Telehealth: Payer: Self-pay | Admitting: Physician Assistant

## 2021-06-02 ENCOUNTER — Other Ambulatory Visit: Payer: Self-pay

## 2021-06-02 ENCOUNTER — Encounter: Payer: Self-pay | Admitting: Physician Assistant

## 2021-06-02 VITALS — BP 97/63 | HR 78 | Temp 97.5°F | Ht 66.0 in | Wt 139.2 lb

## 2021-06-02 DIAGNOSIS — Z716 Tobacco abuse counseling: Secondary | ICD-10-CM | POA: Diagnosis not present

## 2021-06-02 DIAGNOSIS — E785 Hyperlipidemia, unspecified: Secondary | ICD-10-CM

## 2021-06-02 DIAGNOSIS — F419 Anxiety disorder, unspecified: Secondary | ICD-10-CM | POA: Diagnosis not present

## 2021-06-02 DIAGNOSIS — M545 Low back pain, unspecified: Secondary | ICD-10-CM

## 2021-06-02 DIAGNOSIS — G8929 Other chronic pain: Secondary | ICD-10-CM

## 2021-06-02 DIAGNOSIS — F32A Depression, unspecified: Secondary | ICD-10-CM | POA: Diagnosis not present

## 2021-06-02 MED ORDER — ATORVASTATIN CALCIUM 10 MG PO TABS: 10.0000 mg | ORAL_TABLET | Freq: Every day | ORAL | 0 refills | Status: DC

## 2021-06-02 MED ORDER — CITALOPRAM HYDROBROMIDE 40 MG PO TABS: 40.0000 mg | ORAL_TABLET | Freq: Every day | ORAL | 0 refills | Status: DC

## 2021-06-02 MED ORDER — BUPROPION HCL ER (SR) 150 MG PO TB12: 150.0000 mg | ORAL_TABLET | Freq: Two times a day (BID) | ORAL | 0 refills | Status: DC

## 2021-06-02 NOTE — Patient Instructions (Signed)

## 2021-06-02 NOTE — Progress Notes (Signed)
Established Patient Office Visit  Subjective:  Patient ID: Felicia Good, female    DOB: 1961-01-27  Age: 60 y.o. MRN: [id number]  CC:  Chief Complaint  Patient presents with   Hyperlipidemia   Depression   Anxiety    HPI Felicia Good presents for follow up on hyperlipidemia and mood management.  Patient reports would like to quit smoking.  Has smoked on and off and has been able to quit temporarily in the past.  Patient verbalizes interest for starting Wellbutrin.  Smoking is a stress coping mechanism.  Mood: Denies mood changes or SI/HI. Taking medication as directed without issues. Driving to certain places/distances causes severe anxiety which is usually when she takes clonazepam.  HLD: Pt taking medication as directed without issues. Denies side effects including myalgias or muscle weakness.   Chronic low back pain: Patient repots takes flexeril when her back pain is severe and debilitating. Tries to manage with stretches and rests.  Past Medical History:  Diagnosis Date   Anxiety    Depression    IBS (irritable bowel syndrome)    Migraine headache    Mitral valve prolapse    Panic attacks    Rectal polyp    RLS (restless legs syndrome)     Past Surgical History:  Procedure Laterality Date   rectal polyp removed  1987   Local anesthesia used   RECTAL POLYPECTOMY      Family History  Problem Relation Age of Onset   Other Father        esophageal problems   Heart failure Father    Cirrhosis Father        Non alcoholic    Stroke Father        Died in 01-17-14    Hypertension Father    Hypertension Mother    Stroke Mother    Dementia Mother    Breast cancer Maternal Aunt    Colon cancer Neg Hx     Social History   Socioeconomic History   Marital status: Married    Spouse name: [name]   Number of children: 1   Years of education: Not on file   Highest education level: Not on file  Occupational History   Occupation: caregiver  Tobacco Use   Smoking  status: Every Day    Packs/day: 0.25    Years: 40.00    Pack years: 10.00    Types: Cigarettes   Smokeless tobacco: Never   Tobacco comments:    has cut back  Vaping Use   Vaping Use: Never used  Substance and Sexual Activity   Alcohol use: Yes    Alcohol/week: 0.0 standard drinks    Comment: occ   Drug use: No   Sexual activity: Not Currently    Birth control/protection: None  Other Topics Concern   Not on file  Social History Narrative   Not employed    Married    One child       She [place] to be [place] the farm, walking with dog, she likes to go to [employer].    Social Determinants of Health   Financial Resource Strain: Not on file  Food Insecurity: Not on file  Transportation Needs: Not on file  Physical Activity: Not on file  Stress: Not on file  Social Connections: Not on file  Intimate Partner Violence: Not on file    Outpatient Medications Prior to Visit  Medication Sig Dispense Refill   ASPIRIN 81 PO Take by  mouth.     Cholecalciferol (VITAMIN D3) 2000 units TABS Take 1 tablet by mouth daily.     clonazePAM (KLONOPIN) 0.5 MG tablet Take 1 tablet (0.5 mg total) by mouth at bedtime as needed for anxiety. 30 tablet 0   cyclobenzaprine (FLEXERIL) 10 MG tablet Take 1 tablet (10 mg total) by mouth 3 (three) times daily as needed for muscle spasms. 30 tablet 0   dicyclomine (BENTYL) 20 MG tablet TAKE 1 TABLET BY MOUTH 3 TIMES DAILY BEFORE MEALS. 180 tablet 1   famotidine (PEPCID) 20 MG tablet Take 1 tablet (20 mg total) by mouth at bedtime.     fluticasone (FLONASE) 50 MCG/ACT nasal spray Place 1 spray into both nostrils 2 (two) times daily as needed for allergies or rhinitis.     pantoprazole (PROTONIX) 40 MG tablet TAKE 1 TABLET BY MOUTH 2 TIMES DAILY BEFORE A MEAL. 60 tablet 3   Prenatal Vit-Fe Fumarate-FA (PRENATAL VITAMIN PO) Take 1 tablet by mouth daily.     TRIAMCINOLONE ACETONIDE EX 1 application Topical 2 times daily     atorvastatin (LIPITOR) 10 MG tablet  TAKE 1 TABLET (10 MG TOTAL) BY MOUTH DAILY. 90 tablet 1   citalopram (CELEXA) 40 MG tablet Take 1 tablet (40 mg total) by mouth daily. 90 tablet 0   No facility-administered medications prior to visit.    No Known Allergies  ROS Review of Systems A fourteen system review of systems was performed and found to be positive as per HPI.    Objective:    Physical Exam General:  Pleasant and cooperative, in no acute distress  Neuro:  Alert and oriented,  extra-ocular muscles intact  HEENT:  Normocephalic, atraumatic, neck supple Skin:  no gross rash, warm, pink. Cardiac:  RRR, S1 S2 Respiratory:  CTA B/L, Not using accessory muscles, speaking in full sentences- unlabored. Vascular:  Ext warm, no cyanosis apprec.; cap RF less 2 sec. Psych:  No HI/SI, judgement and insight good, Euthymic mood. Full Affect.  BP 97/63   Pulse 78   Temp (!) 97.5 F (36.4 C)   Ht '5\' 6"'$  (1.676 m)   Wt 139 lb 3.2 oz (63.1 kg)   LMP 11/26/2013   SpO2 99%   BMI 22.47 kg/m  Wt Readings from Last 3 Encounters:  06/02/21 139 lb 3.2 oz (63.1 kg)  01/30/21 141 lb 4.8 oz (64.1 kg)  09/25/20 141 lb 12.8 oz (64.3 kg)     Health Maintenance Due  Topic Date Due   Pneumococcal Vaccine 70-73 Years old (1 - PCV) Never done   HIV Screening  Never done   COVID-19 Vaccine (3 - Pfizer risk series) 03/11/2020   MAMMOGRAM  05/07/2021   INFLUENZA VACCINE  05/26/2021    There are no preventive care reminders to display for this patient.  Lab Results  Component Value Date   TSH 3.300 06/12/2020   Lab Results  Component Value Date   WBC 5.8 06/12/2020   HGB 12.5 06/12/2020   HCT 38.2 06/12/2020   MCV 95 06/12/2020   PLT 195 06/12/2020   Lab Results  Component Value Date   NA 141 06/12/2020   K 4.1 06/12/2020   CO2 24 06/12/2020   GLUCOSE 84 06/12/2020   BUN 17 06/12/2020   CREATININE 0.76 06/12/2020   BILITOT 0.3 06/12/2020   ALKPHOS 77 06/12/2020   AST 18 06/12/2020   ALT 18 06/12/2020   PROT 6.7  06/12/2020   ALBUMIN 4.6 06/12/2020   CALCIUM  9.7 06/12/2020   ANIONGAP 9 09/08/2018   GFR 96.10 11/08/2017   Lab Results  Component Value Date   CHOL 171 06/12/2020   Lab Results  Component Value Date   HDL 55 06/12/2020   Lab Results  Component Value Date   LDLCALC 100 (H) 06/12/2020   Lab Results  Component Value Date   TRIG 84 06/12/2020   Lab Results  Component Value Date   CHOLHDL 3.1 06/12/2020   Lab Results  Component Value Date   HGBA1C 5.6 06/12/2020      Assessment & Plan:   Problem List Items Addressed This Visit       Other   Depression   Relevant Medications   citalopram (CELEXA) 40 MG tablet   buPROPion (WELLBUTRIN SR) 150 MG 12 hr tablet   Back pain   Hyperlipidemia - Primary   Relevant Medications   atorvastatin (LIPITOR) 10 MG tablet   Anxiety   Relevant Medications   citalopram (CELEXA) 40 MG tablet   buPROPion (WELLBUTRIN SR) 150 MG 12 hr tablet   Other Visit Diagnoses     Encounter for smoking cessation counseling       Relevant Medications   buPROPion (WELLBUTRIN SR) 150 MG 12 hr tablet      Depression, Anxiety: -PHQ-9 score of 1, GAD-7 score 3, stable. -Continue current medication regimen. -Will continue to monitor.  Hyperlipidemia: -Last lipid panel: Total cholesterol 171, triglycerides 84, HDL 55, LDL 100. -Continue current medication regimen. Advised to schedule lab visit for fasting blood work. -Will continue to monitor.  Encounter for smoking cessation counseling: -The patient was counseled on the dangers of tobacco use, and was advised to quit.  Reviewed strategies to maximize success, including removing cigarettes and smoking materials from environment, stress management, substitution of other forms of reinforcement, and pharmacotherapy (Wellbutrin).  Will start Wellbutrin and advised patient to let me know if unable to tolerate medication.  Chronic low back pain: -Stable. -Continue conservative therapy.  Meds  ordered this encounter  Medications   atorvastatin (LIPITOR) 10 MG tablet    Sig: Take 1 tablet (10 mg total) by mouth daily.    Dispense:  90 tablet    Refill:  0   citalopram (CELEXA) 40 MG tablet    Sig: Take 1 tablet (40 mg total) by mouth daily.    Dispense:  90 tablet    Refill:  0   buPROPion (WELLBUTRIN SR) 150 MG 12 hr tablet    Sig: Take 1 tablet (150 mg total) by mouth 2 (two) times daily.    Dispense:  180 tablet    Refill:  0    Follow-up: Return in about 3 months (around 09/02/2021) for Mood, HLD, tobacco use; FBW this week .   Note:  This note was prepared with assistance of Dragon voice recognition software. Occasional wrong-word or sound-a-like substitutions may have occurred due to the inherent limitations of voice recognition software.  Lorrene Reid, PA-C

## 2021-06-03 ENCOUNTER — Other Ambulatory Visit: Payer: Self-pay | Admitting: Physician Assistant

## 2021-06-03 ENCOUNTER — Other Ambulatory Visit: Payer: BC Managed Care – PPO

## 2021-06-03 DIAGNOSIS — E785 Hyperlipidemia, unspecified: Secondary | ICD-10-CM

## 2021-06-03 DIAGNOSIS — Z79899 Other long term (current) drug therapy: Secondary | ICD-10-CM

## 2021-06-03 DIAGNOSIS — Z0000 Encounter for general adult medical examination without abnormal findings: Secondary | ICD-10-CM

## 2021-06-03 DIAGNOSIS — R7989 Other specified abnormal findings of blood chemistry: Secondary | ICD-10-CM

## 2021-06-04 ENCOUNTER — Other Ambulatory Visit: Payer: BC Managed Care – PPO

## 2021-06-04 ENCOUNTER — Other Ambulatory Visit: Payer: Self-pay

## 2021-06-04 DIAGNOSIS — R7989 Other specified abnormal findings of blood chemistry: Secondary | ICD-10-CM

## 2021-06-04 DIAGNOSIS — E785 Hyperlipidemia, unspecified: Secondary | ICD-10-CM

## 2021-06-04 DIAGNOSIS — Z79899 Other long term (current) drug therapy: Secondary | ICD-10-CM

## 2021-06-04 DIAGNOSIS — Z0000 Encounter for general adult medical examination without abnormal findings: Secondary | ICD-10-CM | POA: Diagnosis not present

## 2021-06-05 LAB — LIPID PANEL
Chol/HDL Ratio: 2.7 ratio (ref 0.0–4.4)
Cholesterol, Total: 163 mg/dL (ref 100–199)
HDL: 60 mg/dL (ref >39)
LDL Chol Calc (NIH): 87 mg/dL (ref 0–99)
Triglycerides: 85 mg/dL (ref 0–149)
VLDL Cholesterol Cal: 16 mg/dL (ref 5–40)

## 2021-06-05 LAB — COMPREHENSIVE METABOLIC PANEL
ALT: 24 IU/L (ref 0–32)
AST: 24 IU/L (ref 0–40)
Albumin/Globulin Ratio: 2.4 — ABNORMAL HIGH (ref 1.2–2.2)
Albumin: 4.5 g/dL (ref 3.8–4.8)
Alkaline Phosphatase: 74 IU/L (ref 44–121)
BUN/Creatinine Ratio: 22 (ref 12–28)
BUN: 16 mg/dL (ref 8–27)
Bilirubin Total: 0.3 mg/dL (ref 0.0–1.2)
CO2: 25 mmol/L (ref 20–29)
Calcium: 9.3 mg/dL (ref 8.7–10.3)
Chloride: 104 mmol/L (ref 96–106)
Creatinine, Ser: 0.74 mg/dL (ref 0.57–1.00)
Globulin, Total: 1.9 g/dL (ref 1.5–4.5)
Glucose: 90 mg/dL (ref 65–99)
Potassium: 4.2 mmol/L (ref 3.5–5.2)
Sodium: 141 mmol/L (ref 134–144)
Total Protein: 6.4 g/dL (ref 6.0–8.5)
eGFR: 92 mL/min/{1.73_m2} (ref >59)

## 2021-06-05 LAB — CBC
Hematocrit: 36.8 % (ref 34.0–46.6)
Hemoglobin: 12.4 g/dL (ref 11.1–15.9)
MCH: 31.6 pg (ref 26.6–33.0)
MCHC: 33.7 g/dL (ref 31.5–35.7)
MCV: 94 fL (ref 79–97)
Platelets: 199 10*3/uL (ref 150–450)
RBC: 3.93 x10E6/uL (ref 3.77–5.28)
RDW: 12.8 % (ref 11.7–15.4)
WBC: 5.9 10*3/uL (ref 3.4–10.8)

## 2021-06-05 LAB — HEMOGLOBIN A1C
Est. average glucose Bld gHb Est-mCnc: 120 mg/dL
Hgb A1c MFr Bld: 5.8 % — ABNORMAL HIGH (ref 4.8–5.6)

## 2021-06-05 LAB — TSH: TSH: 3.49 u[IU]/mL (ref 0.450–4.500)

## 2021-06-09 ENCOUNTER — Other Ambulatory Visit: Payer: Self-pay | Admitting: Gastroenterology

## 2021-06-11 ENCOUNTER — Telehealth: Payer: Self-pay | Admitting: Nurse Practitioner

## 2021-06-11 MED ORDER — DICYCLOMINE HCL 20 MG PO TABS: ORAL_TABLET | ORAL | 0 refills | Status: DC

## 2021-06-11 NOTE — Telephone Encounter (Signed)
Pt is requesting rf for Dyciclomine sent to [facility]. She just made an appt to see APP on 07/14/21.

## 2021-06-11 NOTE — Telephone Encounter (Signed)
One refill sent 

## 2021-07-14 ENCOUNTER — Ambulatory Visit: Payer: BC Managed Care – PPO | Admitting: Physician Assistant

## 2021-07-25 DIAGNOSIS — I341 Nonrheumatic mitral (valve) prolapse: Secondary | ICD-10-CM | POA: Insufficient documentation

## 2021-07-25 DIAGNOSIS — Z01419 Encounter for gynecological examination (general) (routine) without abnormal findings: Secondary | ICD-10-CM | POA: Diagnosis not present

## 2021-07-25 DIAGNOSIS — N393 Stress incontinence (female) (male): Secondary | ICD-10-CM | POA: Diagnosis not present

## 2021-07-25 DIAGNOSIS — N905 Atrophy of vulva: Secondary | ICD-10-CM | POA: Diagnosis not present

## 2021-07-25 DIAGNOSIS — Z1231 Encounter for screening mammogram for malignant neoplasm of breast: Secondary | ICD-10-CM | POA: Diagnosis not present

## 2021-07-25 DIAGNOSIS — Z1239 Encounter for other screening for malignant neoplasm of breast: Secondary | ICD-10-CM | POA: Diagnosis not present

## 2021-07-25 DIAGNOSIS — Z124 Encounter for screening for malignant neoplasm of cervix: Secondary | ICD-10-CM | POA: Diagnosis not present

## 2021-07-25 LAB — HM MAMMOGRAPHY

## 2021-07-25 LAB — HM PAP SMEAR: HM Pap smear: NEGATIVE

## 2021-08-07 ENCOUNTER — Encounter: Payer: Self-pay | Admitting: Gastroenterology

## 2021-08-07 ENCOUNTER — Ambulatory Visit: Payer: BC Managed Care – PPO | Admitting: Gastroenterology

## 2021-08-07 VITALS — BP 100/70 | HR 76 | Ht 66.5 in | Wt 141.1 lb

## 2021-08-07 DIAGNOSIS — K219 Gastro-esophageal reflux disease without esophagitis: Secondary | ICD-10-CM

## 2021-08-07 DIAGNOSIS — K589 Irritable bowel syndrome without diarrhea: Secondary | ICD-10-CM | POA: Diagnosis not present

## 2021-08-07 MED ORDER — DICYCLOMINE HCL 20 MG PO TABS: 20.0000 mg | ORAL_TABLET | Freq: Three times a day (TID) | ORAL | 3 refills | Status: DC

## 2021-08-07 MED ORDER — PANTOPRAZOLE SODIUM 40 MG PO TBEC: 40.0000 mg | DELAYED_RELEASE_TABLET | Freq: Two times a day (BID) | ORAL | 3 refills | Status: DC

## 2021-08-07 NOTE — Patient Instructions (Signed)
We have sent the following medications to your pharmacy for you to pick up at your convenience: Pantoprazole 40 mg twice daily 30-60 minutes before breakfast and dinner. Dicyclomine 20 mg three times daily before meals.   If you are age 61 or older, your body mass index should be between 23-30. Your Body mass index is 22.44 kg/m. If this is out of the aforementioned range listed, please consider follow up with your Primary Care Provider.  If you are age 66 or younger, your body mass index should be between 19-25. Your Body mass index is 22.44 kg/m. If this is out of the aformentioned range listed, please consider follow up with your Primary Care Provider.   __________________________________________________________  The [facility] providers would like to encourage you to use [facility] to communicate with providers for non-urgent requests or questions.  Due to long hold times on the telephone, sending your provider a message by [facility] may be a faster and more efficient way to get a response.  Please allow 48 business hours for a response.  Please remember that this is for non-urgent requests.

## 2021-08-07 NOTE — Progress Notes (Signed)
Symptoms are well controlled. Recommended lowest dose of PPI necessary to control her symptoms. Would not stop the medication abruptly but consider more of a taper at that time. Agree with plans for annual follow-up while on PPI therapy.

## 2021-08-07 NOTE — Progress Notes (Signed)
08/07/2021 Felicia Good [id number] [birth year]   HISTORY OF PRESENT ILLNESS: This is a 60 year old female who is a patient of Dr. Tarri Glenn.  She has past medical history of anxiety, depression, migraine headaches, MVP, liver cyst/hemangioma.  She was last seen here on 06/06/2020 by one of our NP's, [facility].  She is here today to obtain medication refills.  She is on pantoprazole 40 mg twice daily and says that her acid reflux issues are very well controlled.  On occasion if she eats something too late at night she will require taking a Pepcid 20 mg at bedtime.  She also takes dicyclomine 20 mg twice daily when she eats.  She says that she does not have any issues and just needs both of those medications refilled.  She is moving her bowels well.  No rectal bleeding.  No dysphagia.   EGD 11/01/2018 Dr. Tarri Glenn:  - Normal esophagus. - Non-bleeding erosive gastropathy. Biopsied. - Erythematous duodenopathy. Biopsied. - The examination was otherwise normal. 1. Surgical [P], distal duodenum - ULCERATIVE DUODENITIS. - THERE IS NO EVIDENCE OF VILLOUS ATROPHY, DYSPLASIA OR MALIGNANCY. 2. Surgical [P], random sites gastric - CHRONIC INACTIVE GASTRITIS. - THERE IS NO EVIDENCE OF HELICOBACTER PYLORI, DYSPLASIA OR MALIGNANCY. - SEE COMMENT.   Colonoscopy 03/06/2015: Normal   Past Medical History:  Diagnosis Date   Anxiety    Depression    IBS (irritable bowel syndrome)    Migraine headache    Mitral valve prolapse    Panic attacks    Rectal polyp    RLS (restless legs syndrome)    Past Surgical History:  Procedure Laterality Date   rectal polyp removed  1987   Local anesthesia used   RECTAL POLYPECTOMY      reports that she has been smoking cigarettes. She has a 10.00 pack-year smoking history. She has never used smokeless tobacco. She reports current alcohol use. She reports that she does not use drugs. family history includes Breast cancer in her maternal aunt;  Cirrhosis in her father; Dementia in her mother; Heart failure in her father; Hypertension in her father and mother; Other in her father; Stroke in her father and mother. No Known Allergies    Outpatient Encounter Medications as of 08/07/2021  Medication Sig   ASPIRIN 81 PO Take by mouth.   atorvastatin (LIPITOR) 10 MG tablet Take 1 tablet (10 mg total) by mouth daily.   buPROPion (WELLBUTRIN SR) 150 MG 12 hr tablet Take 1 tablet (150 mg total) by mouth 2 (two) times daily.   Cholecalciferol (VITAMIN D3) 2000 units TABS Take 1 tablet by mouth daily.   citalopram (CELEXA) 40 MG tablet Take 1 tablet (40 mg total) by mouth daily.   clonazePAM (KLONOPIN) 0.5 MG tablet Take 1 tablet (0.5 mg total) by mouth at bedtime as needed for anxiety.   cyclobenzaprine (FLEXERIL) 10 MG tablet Take 1 tablet (10 mg total) by mouth 3 (three) times daily as needed for muscle spasms.   dicyclomine (BENTYL) 20 MG tablet TAKE 1 TABLET BY MOUTH 3 TIMES DAILY BEFORE MEALS.   famotidine (PEPCID) 20 MG tablet Take 1 tablet (20 mg total) by mouth at bedtime.   fluticasone (FLONASE) 50 MCG/ACT nasal spray Place 1 spray into both nostrils 2 (two) times daily as needed for allergies or rhinitis.   pantoprazole (PROTONIX) 40 MG tablet TAKE 1 TABLET BY MOUTH 2 TIMES DAILY BEFORE A MEAL.   Prenatal Vit-Fe Fumarate-FA (PRENATAL VITAMIN PO) Take 1 tablet by  mouth daily.   TRIAMCINOLONE ACETONIDE EX 1 application Topical 2 times daily   No facility-administered encounter medications on file as of 08/07/2021.     REVIEW OF SYSTEMS  : All other systems reviewed and negative except where noted in the History of Present Illness.   PHYSICAL EXAM: BP 100/70 (BP Location: Left Arm, Patient Position: Sitting, Cuff Size: Normal)   Pulse 76   Ht 5' 6.5" (1.689 m)   Wt 141 lb 2 oz (64 kg)   LMP 11/26/2013   BMI 22.44 kg/m  General: Well developed white female in no acute distress Head: Normocephalic and atraumatic Eyes:   Sclerae anicteric, conjunctiva pink. Ears: Normal auditory acuity Lungs: Clear throughout to auscultation; no W/R/R. Heart: Regular rate and rhythm; no M/R/G. Abdomen: Soft, non-distended.  BS present.  Non-tender. Musculoskeletal: Symmetrical with no gross deformities  Skin: No lesions on visible extremities Extremities: No edema  Neurological: Alert oriented x 4, grossly non-focal Psychological:  Alert and cooperative. Normal mood and affect  ASSESSMENT AND PLAN: *GERD: Well-controlled on pantoprazole 40 mg twice daily.  Requires Pepcid very occasionally at bedtime.  We will send new prescription for pantoprazole to her pharmacy with enough refills for a year. *IBS: Takes dicyclomine 20 mg twice daily.  Needs refills on this as well.  It is prescribed for 3 times a day, but she usually only uses it twice daily.  We will send a new prescription to her pharmacy with enough refills for a year.  **Patient reports that she has a high co-pay for specialists at $150.  She was unhappy about having to come in for medication refills today.  I suggested that if her symptoms seem to be well controlled that maybe she see if her PCP would be willing to take over her prescriptions and return here only if she is having issues/problems.  Otherwise she would need to be seen here at least every 12 to 18 months to obtain prescription renewals from our office.   CC:  Lorrene Reid, PA-C

## 2021-08-08 ENCOUNTER — Ambulatory Visit (INDEPENDENT_AMBULATORY_CARE_PROVIDER_SITE_OTHER): Payer: BC Managed Care – PPO | Admitting: Nurse Practitioner

## 2021-08-08 ENCOUNTER — Other Ambulatory Visit: Payer: Self-pay

## 2021-08-08 VITALS — BP 92/57 | HR 80 | Temp 97.6°F | Wt 141.0 lb

## 2021-08-08 DIAGNOSIS — Z23 Encounter for immunization: Secondary | ICD-10-CM

## 2021-08-08 NOTE — Progress Notes (Signed)
HPI - Patient is here for her annual influenza vaccine. Denies any allergy to latex or eggs. Denies having an adverse reaction to a flu vaccine in the past.    Assessment and Plan - Patient tolerated injection well.

## 2021-09-03 ENCOUNTER — Other Ambulatory Visit: Payer: Self-pay

## 2021-09-03 ENCOUNTER — Ambulatory Visit: Payer: BC Managed Care – PPO | Admitting: Physician Assistant

## 2021-09-03 ENCOUNTER — Encounter: Payer: Self-pay | Admitting: Physician Assistant

## 2021-09-03 VITALS — BP 89/59 | HR 82 | Temp 98.3°F | Ht 67.0 in | Wt 140.0 lb

## 2021-09-03 DIAGNOSIS — F32A Depression, unspecified: Secondary | ICD-10-CM

## 2021-09-03 DIAGNOSIS — K589 Irritable bowel syndrome without diarrhea: Secondary | ICD-10-CM

## 2021-09-03 DIAGNOSIS — F410 Panic disorder [episodic paroxysmal anxiety] without agoraphobia: Secondary | ICD-10-CM | POA: Diagnosis not present

## 2021-09-03 DIAGNOSIS — E785 Hyperlipidemia, unspecified: Secondary | ICD-10-CM

## 2021-09-03 DIAGNOSIS — F419 Anxiety disorder, unspecified: Secondary | ICD-10-CM | POA: Diagnosis not present

## 2021-09-03 DIAGNOSIS — R7301 Impaired fasting glucose: Secondary | ICD-10-CM | POA: Diagnosis not present

## 2021-09-03 DIAGNOSIS — Z0000 Encounter for general adult medical examination without abnormal findings: Secondary | ICD-10-CM | POA: Diagnosis not present

## 2021-09-03 DIAGNOSIS — K219 Gastro-esophageal reflux disease without esophagitis: Secondary | ICD-10-CM

## 2021-09-03 MED ORDER — CLONAZEPAM 0.5 MG PO TABS: 0.5000 mg | ORAL_TABLET | Freq: Every evening | ORAL | 0 refills | Status: DC | PRN

## 2021-09-03 NOTE — Progress Notes (Signed)
Established Patient Office Visit  Subjective:  Patient ID: Felicia Good, female    DOB: 1961-08-19  Age: 60 y.o. MRN: [id number]  CC:  Chief Complaint  Patient presents with   Follow-up    Mood   Hyperlipidemia    HPI Felicia Good presents for follow up on mood and hyperlipidemia.  Mood: Reports medication compliance. Feels more motivated to do things around the house. 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 Reports Wellbutrin has helped some with smoking less. Does not need to take a cigarette when going for walks. Smoking is coping mechanism for stress.    HLD: Pt taking medication as directed without issues. Goes for walks with her dog. Reports is fasting for blood work. Tries to have a balanced diet.   Past Medical History:  Diagnosis Date   Anxiety    Depression    IBS (irritable bowel syndrome)    Migraine headache    Mitral valve prolapse    Panic attacks    Rectal polyp    RLS (restless legs syndrome)     Past Surgical History:  Procedure Laterality Date   rectal polyp removed  1987   Local anesthesia used   RECTAL POLYPECTOMY      Family History  Problem Relation Age of Onset   Other Father        esophageal problems   Heart failure Father    Cirrhosis Father        Non alcoholic    Stroke Father        Died in 2013/12/06    Hypertension Father    Hypertension Mother    Stroke Mother    Dementia Mother    Breast cancer Maternal Aunt    Colon cancer Neg Hx     Social History   Socioeconomic History   Marital status: Married    Spouse name: [name]   Number of children: 1   Years of education: Not on file   Highest education level: Not on file  Occupational History   Occupation: caregiver  Tobacco Use   Smoking status: Every Day    Packs/day: 0.25    Years: 40.00    Pack years: 10.00    Types: Cigarettes    Smokeless tobacco: Never   Tobacco comments:    has cut back  Vaping Use   Vaping Use: Never used  Substance and Sexual Activity   Alcohol use: Yes    Alcohol/week: 0.0 standard drinks    Comment: occ   Drug use: No   Sexual activity: Not Currently    Birth control/protection: None  Other Topics Concern   Not on file  Social History Narrative   Not employed    Married    One child       [place], walking with dog, she likes to go to [employer].    Social Determinants of Health   Financial Resource Strain: Not on file  Food Insecurity: Not on file  Transportation Needs: Not on file  Physical Activity: Not on file  Stress: Not on file  Social Connections: Not on file  Intimate Partner Violence: Not on file    Outpatient Medications Prior to Visit  Medication Sig Dispense Refill   ASPIRIN 81 PO Take by mouth.     atorvastatin (LIPITOR)  10 MG tablet Take 1 tablet (10 mg total) by mouth daily. 90 tablet 0   buPROPion (WELLBUTRIN SR) 150 MG 12 hr tablet Take 1 tablet (150 mg total) by mouth 2 (two) times daily. 180 tablet 0   Cholecalciferol (VITAMIN D3) 2000 units TABS Take 1 tablet by mouth daily.     citalopram (CELEXA) 40 MG tablet Take 1 tablet (40 mg total) by mouth daily. 90 tablet 0   cyclobenzaprine (FLEXERIL) 10 MG tablet Take 1 tablet (10 mg total) by mouth 3 (three) times daily as needed for muscle spasms. 30 tablet 0   dicyclomine (BENTYL) 20 MG tablet Take 1 tablet (20 mg total) by mouth 3 (three) times daily before meals. 270 tablet 3   famotidine (PEPCID) 20 MG tablet Take 1 tablet (20 mg total) by mouth at bedtime.     fluticasone (FLONASE) 50 MCG/ACT nasal spray Place 1 spray into both nostrils 2 (two) times daily as needed for allergies or rhinitis.     pantoprazole (PROTONIX) 40 MG tablet Take 1 tablet (40 mg total) by mouth 2 (two) times daily before a meal. 180 tablet 3   TRIAMCINOLONE ACETONIDE EX 1 application Topical 2 times daily      clonazePAM (KLONOPIN) 0.5 MG tablet Take 1 tablet (0.5 mg total) by mouth at bedtime as needed for anxiety. 30 tablet 0   Prenatal Vit-Fe Fumarate-FA (PRENATAL VITAMIN PO) Take 1 tablet by mouth daily.     No facility-administered medications prior to visit.    No Known Allergies  ROS Review of Systems Review of Systems:  A fourteen system review of systems was performed and found to be positive as per HPI.   Objective:    Physical Exam General:  Pleasant and cooperative, in no acute distress  Neuro:  Alert and oriented,  extra-ocular muscles intact  HEENT:  Normocephalic, atraumatic, neck supple Skin:  no gross rash, warm, pink. Cardiac:  RRR, S1 S2 Respiratory: CTA B/L, Not using accessory muscles, speaking in full sentences- unlabored. Vascular:  Ext warm, no cyanosis apprec.; cap RF less 2 sec. Psych:  No HI/SI, judgement and insight good, Euthymic mood. Full Affect.  BP (!) 89/59   Pulse 82   Temp 98.3 F (36.8 C)   Ht _0  (1.702 m)   Wt 140 lb (63.5 kg)   LMP 11/26/2013   SpO2 97%   BMI 21.93 kg/m  Wt Readings from Last 3 Encounters:  09/03/21 140 lb (63.5 kg)  08/08/21 141 lb (64 kg)  08/07/21 141 lb 2 oz (64 kg)     Health Maintenance Due  Topic Date Due   Pneumococcal Vaccine 64-9 Years old (1 - PCV) Never done   HIV Screening  Never done   COVID-19 Vaccine (3 - Pfizer risk series) 03/11/2020   MAMMOGRAM  05/07/2021    There are no preventive care reminders to display for this patient.  Lab Results  Component Value Date   TSH 3.490 06/04/2021   Lab Results  Component Value Date   WBC 6.1 09/03/2021   HGB 13.0 09/03/2021   HCT 38.7 09/03/2021   MCV 94 09/03/2021   PLT 220 09/03/2021   Lab Results  Component Value Date   NA 142 09/03/2021   K 4.4 09/03/2021   CO2 25 09/03/2021   GLUCOSE 89 09/03/2021   BUN 18 09/03/2021   CREATININE 0.74 09/03/2021   BILITOT 0.3 09/03/2021   ALKPHOS 75 09/03/2021   AST 24 09/03/2021   ALT  27  09/03/2021   PROT 6.9 09/03/2021   ALBUMIN 5.0 (H) 09/03/2021   CALCIUM 9.6 09/03/2021   ANIONGAP 9 09/08/2018   EGFR 92 09/03/2021   GFR 96.10 11/08/2017   Lab Results  Component Value Date   CHOL 194 09/03/2021   Lab Results  Component Value Date   HDL 70 09/03/2021   Lab Results  Component Value Date   LDLCALC 112 (H) 09/03/2021   Lab Results  Component Value Date   TRIG 68 09/03/2021   Lab Results  Component Value Date   CHOLHDL 2.8 09/03/2021   Lab Results  Component Value Date   HGBA1C 5.6 09/03/2021   Depression screen [facility] 2/9 09/03/2021 06/02/2021 01/30/2021 09/25/2020 06/12/2020  Decreased Interest 0 0 0 0 0  Down, Depressed, Hopeless 1 0 0 0 0  PHQ - 2 Score 1 0 0 0 0  Altered sleeping 0 0 0 0 0  Tired, decreased energy _0 0 1  Change in appetite 0 0 0 0 0  Feeling bad or failure about yourself  0 0 0 0 0  Trouble concentrating 0 0 0 0 0  Moving slowly or fidgety/restless 0 0 0 0 0  Suicidal thoughts 0 0 0 0 0  PHQ-9 Score _1 0 1  Difficult doing work/chores Not difficult at all Not difficult at all - - Not difficult at all  Some recent data might be hidden   GAD 7 : Generalized Anxiety Score 09/03/2021 06/02/2021 06/12/2020 06/14/2017  Nervous, Anxious, on Edge _2 Control/stop worrying 0 0 0 3  Worry too much - different things 0 0 0 2  Trouble relaxing 0 1 0 2  Restless 0 0 0 1  Easily annoyed or irritable 1 1 0 3  Afraid - awful might happen 0 0 0 1  Total GAD 7 Score _3 Anxiety Difficulty Not difficult at all Not difficult at all Not difficult at all Not difficult at all       Assessment & Plan:   Problem List Items Addressed This Visit       Digestive   Gastroesophageal reflux disease   Irritable bowel syndrome     Other   Depression   PANIC DISORDER,NO AGORAPHOBIA   Relevant Medications   clonazePAM (KLONOPIN) 0.5 MG tablet   Healthcare maintenance   Relevant Orders   CBC with Differential/Platelet (Completed)    Comprehensive metabolic panel (Completed)   Lipid panel (Completed)   Hyperlipidemia   Relevant Orders   Lipid panel (Completed)   Anxiety - Primary   Other Visit Diagnoses     Impaired fasting glucose       Relevant Orders   CBC with Differential/Platelet (Completed)   Comprehensive metabolic panel (Completed)   Lipid panel (Completed)   HgB A1c (Completed)      Depression, anxiety: -Stable. -Continue current medication regimen. -Will continue to monitor.  Panic disorder: -Stable. -PDMP reviewed, no aberrancies noted. -Controlled substance contract updated. -Will continue to monitor.  Hyperlipidemia: -Last lipid panel wnl's, LDL 87. -Continue current medication regimen. -Recommend to follow a low fat diet. -Will repeat lipid panel and hepatic function.  GERD, IBS: -Patient on dicyclomine 20 mg TID, pantoprazole 40 mg and famotidine 20 mg at bedtime when needed. Symptoms are well controlled. Due to high specialist co-pay will take over medication management and recommend to follow up with gastroenterology if symptoms worsen or new issues develop. Patient verbalized  understanding.   Impaired fasting glucose: -Last A1c 5.8, will repeat A1c today. -Recommend to follow a low carbohydrate and glucose diet. -Will continue to monitor.   Meds ordered this encounter  Medications   clonazePAM (KLONOPIN) 0.5 MG tablet    Sig: Take 1 tablet (0.5 mg total) by mouth at bedtime as needed for anxiety.    Dispense:  30 tablet    Refill:  0     Follow-up: Return in about 2 months (around 11/03/2021) for CPE .    Lorrene Reid, PA-C

## 2021-09-04 ENCOUNTER — Encounter: Payer: Self-pay | Admitting: Physician Assistant

## 2021-09-04 LAB — HEMOGLOBIN A1C
Est. average glucose Bld gHb Est-mCnc: 114 mg/dL
Hgb A1c MFr Bld: 5.6 % (ref 4.8–5.6)

## 2021-09-04 LAB — COMPREHENSIVE METABOLIC PANEL
ALT: 27 IU/L (ref 0–32)
AST: 24 IU/L (ref 0–40)
Albumin/Globulin Ratio: 2.6 — ABNORMAL HIGH (ref 1.2–2.2)
Albumin: 5 g/dL — ABNORMAL HIGH (ref 3.8–4.8)
Alkaline Phosphatase: 75 IU/L (ref 44–121)
BUN/Creatinine Ratio: 24 (ref 12–28)
BUN: 18 mg/dL (ref 8–27)
Bilirubin Total: 0.3 mg/dL (ref 0.0–1.2)
CO2: 25 mmol/L (ref 20–29)
Calcium: 9.6 mg/dL (ref 8.7–10.3)
Chloride: 104 mmol/L (ref 96–106)
Creatinine, Ser: 0.74 mg/dL (ref 0.57–1.00)
Globulin, Total: 1.9 g/dL (ref 1.5–4.5)
Glucose: 89 mg/dL (ref 70–99)
Potassium: 4.4 mmol/L (ref 3.5–5.2)
Sodium: 142 mmol/L (ref 134–144)
Total Protein: 6.9 g/dL (ref 6.0–8.5)
eGFR: 92 mL/min/{1.73_m2} (ref >59)

## 2021-09-04 LAB — CBC WITH DIFFERENTIAL/PLATELET
Basophils Absolute: 0 10*3/uL (ref 0.0–0.2)
Basos: 1 %
EOS (ABSOLUTE): 0.2 10*3/uL (ref 0.0–0.4)
Eos: 3 %
Hematocrit: 38.7 % (ref 34.0–46.6)
Hemoglobin: 13 g/dL (ref 11.1–15.9)
Immature Grans (Abs): 0 10*3/uL (ref 0.0–0.1)
Immature Granulocytes: 0 %
Lymphocytes Absolute: 1.8 10*3/uL (ref 0.7–3.1)
Lymphs: 30 %
MCH: 31.6 pg (ref 26.6–33.0)
MCHC: 33.6 g/dL (ref 31.5–35.7)
MCV: 94 fL (ref 79–97)
Monocytes Absolute: 0.4 10*3/uL (ref 0.1–0.9)
Monocytes: 7 %
Neutrophils Absolute: 3.6 10*3/uL (ref 1.4–7.0)
Neutrophils: 59 %
Platelets: 220 10*3/uL (ref 150–450)
RBC: 4.12 x10E6/uL (ref 3.77–5.28)
RDW: 13.1 % (ref 11.7–15.4)
WBC: 6.1 10*3/uL (ref 3.4–10.8)

## 2021-09-04 LAB — LIPID PANEL
Chol/HDL Ratio: 2.8 ratio (ref 0.0–4.4)
Cholesterol, Total: 194 mg/dL (ref 100–199)
HDL: 70 mg/dL (ref >39)
LDL Chol Calc (NIH): 112 mg/dL — ABNORMAL HIGH (ref 0–99)
Triglycerides: 68 mg/dL (ref 0–149)
VLDL Cholesterol Cal: 12 mg/dL (ref 5–40)

## 2021-10-06 ENCOUNTER — Ambulatory Visit: Payer: BC Managed Care – PPO | Admitting: Dermatology

## 2021-11-01 DIAGNOSIS — H40033 Anatomical narrow angle, bilateral: Secondary | ICD-10-CM | POA: Diagnosis not present

## 2021-11-01 DIAGNOSIS — H2513 Age-related nuclear cataract, bilateral: Secondary | ICD-10-CM | POA: Diagnosis not present

## 2021-11-03 ENCOUNTER — Encounter: Payer: Self-pay | Admitting: Physician Assistant

## 2021-11-03 ENCOUNTER — Ambulatory Visit (INDEPENDENT_AMBULATORY_CARE_PROVIDER_SITE_OTHER): Payer: BC Managed Care – PPO | Admitting: Physician Assistant

## 2021-11-03 ENCOUNTER — Other Ambulatory Visit: Payer: Self-pay

## 2021-11-03 VITALS — BP 91/63 | HR 77 | Temp 98.0°F | Ht 67.0 in | Wt 141.0 lb

## 2021-11-03 DIAGNOSIS — Z23 Encounter for immunization: Secondary | ICD-10-CM | POA: Diagnosis not present

## 2021-11-03 DIAGNOSIS — F32A Depression, unspecified: Secondary | ICD-10-CM

## 2021-11-03 DIAGNOSIS — Z1321 Encounter for screening for nutritional disorder: Secondary | ICD-10-CM

## 2021-11-03 DIAGNOSIS — Z13228 Encounter for screening for other metabolic disorders: Secondary | ICD-10-CM | POA: Diagnosis not present

## 2021-11-03 DIAGNOSIS — E785 Hyperlipidemia, unspecified: Secondary | ICD-10-CM

## 2021-11-03 DIAGNOSIS — Z130 Encounter for screening for diseases of the blood and blood-forming organs and certain disorders involving the immune mechanism: Secondary | ICD-10-CM

## 2021-11-03 DIAGNOSIS — Z0000 Encounter for general adult medical examination without abnormal findings: Secondary | ICD-10-CM

## 2021-11-03 DIAGNOSIS — Z1329 Encounter for screening for other suspected endocrine disorder: Secondary | ICD-10-CM

## 2021-11-03 DIAGNOSIS — F419 Anxiety disorder, unspecified: Secondary | ICD-10-CM

## 2021-11-03 MED ORDER — CITALOPRAM HYDROBROMIDE 40 MG PO TABS: 40.0000 mg | ORAL_TABLET | Freq: Every day | ORAL | 1 refills | Status: DC

## 2021-11-03 NOTE — Progress Notes (Signed)
Subjective:     Felicia Good is a 61 y.o. female and is here for a comprehensive physical exam. The patient reports no problems.  Social History   Socioeconomic History   Marital status: Married    Spouse name: [name]   Number of children: 1   Years of education: Not on file   Highest education level: Not on file  Occupational History   Occupation: caregiver  Tobacco Use   Smoking status: Every Day    Packs/day: 0.25    Years: 40.00    Pack years: 10.00    Types: Cigarettes   Smokeless tobacco: Never   Tobacco comments:    has cut back  Vaping Use   Vaping Use: Never used  Substance and Sexual Activity   Alcohol use: Yes    Alcohol/week: 0.0 standard drinks    Comment: occ   Drug use: No   Sexual activity: Not Currently    Birth control/protection: None  Other Topics Concern   Not on file  Social History Narrative   Not employed    Married    One child       She [place] to be at the farm, walking with dog, she likes to go to [employer].    Social Determinants of Health   Financial Resource Strain: Not on file  Food Insecurity: Not on file  Transportation Needs: Not on file  Physical Activity: Not on file  Stress: Not on file  Social Connections: Not on file  Intimate Partner Violence: Not on file   Health Maintenance  Topic Date Due   Pneumococcal Vaccine 33-44 Years old (1 - PCV) Never done   HIV Screening  Never done   COVID-19 Vaccine (3 - Pfizer risk series) 03/11/2020   TETANUS/TDAP  10/05/2021   MAMMOGRAM  07/26/2023   PAP SMEAR-Modifier  07/25/2024   COLONOSCOPY (Pts 45-21yrs Insurance coverage will need to be confirmed)  03/05/2025   INFLUENZA VACCINE  Completed   Hepatitis C Screening  Completed   Zoster Vaccines- Shingrix  Completed   HPV VACCINES  Aged Out    The following portions of the patient's history were reviewed and updated as appropriate: allergies, current medications, past family history, past medical history, past social  history, past surgical history, and problem list.  Review of Systems Pertinent items noted in HPI and remainder of comprehensive ROS otherwise negative.   Objective:    BP 91/63    Pulse 77    Temp 98 F (36.7 C)    Ht 5\' 7"  (1.702 m)    Wt 141 lb (64 kg)    LMP 11/26/2013    SpO2 96%    BMI 22.08 kg/m  General appearance: alert, cooperative, and no distress Head: Normocephalic, without obvious abnormality, atraumatic Eyes: conjunctivae/corneas clear. PERRL, EOM's intact. Fundi benign. Ears: normal TM's and external ear canals both ears Nose: Nares normal. Septum midline. Mucosa normal. No drainage or sinus tenderness. Throat: lips, mucosa, and tongue normal; teeth and gums normal Neck: no adenopathy, no JVD, supple, symmetrical, trachea midline, and thyroid: normal to inspection and palpation Back: symmetric, no curvature. ROM normal. No CVA tenderness. Lungs: clear to auscultation bilaterally Heart: regular rate and rhythm, S1, S2 normal, no murmur, click, rub or gallop Abdomen: soft, non-tender; bowel sounds normal; no masses,  no organomegaly Extremities: extremities normal, atraumatic, no cyanosis or edema Pulses: 2+ and symmetric Skin: mobility and turgor normal and no edema or nevi - scattered Lymph nodes: Cervical adenopathy:  normal and Supraclavicular adenopathy: normal Neurologic: Grossly normal    Assessment:    Healthy female exam.    Plan:  -Will obtain routine fasting labs.  -Agreeable to Tdap and Prevnar 20. Deferred HIV screening. -UTD mammogram, pap, colonoscopy.  -Recommend to follow a heart healthy diet low in fat and carbohydrates. -Stay well hydrated. -Follow up in 4 months for mood, HLD, GERD, IBS   See After Visit Summary for Counseling Recommendations

## 2021-11-03 NOTE — Patient Instructions (Signed)

## 2021-11-04 LAB — CBC WITH DIFFERENTIAL/PLATELET
Basophils Absolute: 0.1 10*3/uL (ref 0.0–0.2)
Basos: 1 %
EOS (ABSOLUTE): 0.2 10*3/uL (ref 0.0–0.4)
Eos: 3 %
Hematocrit: 38.7 % (ref 34.0–46.6)
Hemoglobin: 13.2 g/dL (ref 11.1–15.9)
Immature Grans (Abs): 0 10*3/uL (ref 0.0–0.1)
Immature Granulocytes: 0 %
Lymphocytes Absolute: 1.7 10*3/uL (ref 0.7–3.1)
Lymphs: 27 %
MCH: 32 pg (ref 26.6–33.0)
MCHC: 34.1 g/dL (ref 31.5–35.7)
MCV: 94 fL (ref 79–97)
Monocytes Absolute: 0.4 10*3/uL (ref 0.1–0.9)
Monocytes: 6 %
Neutrophils Absolute: 4.1 10*3/uL (ref 1.4–7.0)
Neutrophils: 63 %
Platelets: 212 10*3/uL (ref 150–450)
RBC: 4.13 x10E6/uL (ref 3.77–5.28)
RDW: 12.8 % (ref 11.7–15.4)
WBC: 6.4 10*3/uL (ref 3.4–10.8)

## 2021-11-04 LAB — LIPID PANEL
Chol/HDL Ratio: 3 ratio (ref 0.0–4.4)
Cholesterol, Total: 184 mg/dL (ref 100–199)
HDL: 62 mg/dL (ref >39)
LDL Chol Calc (NIH): 102 mg/dL — ABNORMAL HIGH (ref 0–99)
Triglycerides: 115 mg/dL (ref 0–149)
VLDL Cholesterol Cal: 20 mg/dL (ref 5–40)

## 2021-11-04 LAB — COMPREHENSIVE METABOLIC PANEL
ALT: 26 IU/L (ref 0–32)
AST: 28 IU/L (ref 0–40)
Albumin/Globulin Ratio: 2 (ref 1.2–2.2)
Albumin: 4.7 g/dL (ref 3.8–4.8)
Alkaline Phosphatase: 77 IU/L (ref 44–121)
BUN/Creatinine Ratio: 22 (ref 12–28)
BUN: 18 mg/dL (ref 8–27)
Bilirubin Total: 0.3 mg/dL (ref 0.0–1.2)
CO2: 26 mmol/L (ref 20–29)
Calcium: 9.6 mg/dL (ref 8.7–10.3)
Chloride: 102 mmol/L (ref 96–106)
Creatinine, Ser: 0.81 mg/dL (ref 0.57–1.00)
Globulin, Total: 2.3 g/dL (ref 1.5–4.5)
Glucose: 89 mg/dL (ref 70–99)
Potassium: 4.2 mmol/L (ref 3.5–5.2)
Sodium: 140 mmol/L (ref 134–144)
Total Protein: 7 g/dL (ref 6.0–8.5)
eGFR: 83 mL/min/{1.73_m2} (ref >59)

## 2021-11-04 LAB — HEMOGLOBIN A1C
Est. average glucose Bld gHb Est-mCnc: 111 mg/dL
Hgb A1c MFr Bld: 5.5 % (ref 4.8–5.6)

## 2021-11-04 LAB — TSH: TSH: 2.41 u[IU]/mL (ref 0.450–4.500)

## 2021-11-13 ENCOUNTER — Other Ambulatory Visit: Payer: Self-pay | Admitting: Physician Assistant

## 2021-11-13 DIAGNOSIS — E785 Hyperlipidemia, unspecified: Secondary | ICD-10-CM

## 2021-12-18 ENCOUNTER — Ambulatory Visit: Payer: BC Managed Care – PPO | Admitting: Dermatology

## 2022-01-21 ENCOUNTER — Ambulatory Visit: Payer: BC Managed Care – PPO | Admitting: Dermatology

## 2022-03-03 ENCOUNTER — Encounter: Payer: Self-pay | Admitting: Physician Assistant

## 2022-03-03 ENCOUNTER — Ambulatory Visit (INDEPENDENT_AMBULATORY_CARE_PROVIDER_SITE_OTHER): Payer: BC Managed Care – PPO | Admitting: Physician Assistant

## 2022-03-03 VITALS — BP 84/55 | HR 77 | Temp 97.7°F | Ht 67.0 in | Wt 137.0 lb

## 2022-03-03 DIAGNOSIS — K589 Irritable bowel syndrome without diarrhea: Secondary | ICD-10-CM | POA: Diagnosis not present

## 2022-03-03 DIAGNOSIS — F419 Anxiety disorder, unspecified: Secondary | ICD-10-CM | POA: Diagnosis not present

## 2022-03-03 DIAGNOSIS — E785 Hyperlipidemia, unspecified: Secondary | ICD-10-CM

## 2022-03-03 DIAGNOSIS — F32A Depression, unspecified: Secondary | ICD-10-CM | POA: Diagnosis not present

## 2022-03-03 DIAGNOSIS — K219 Gastro-esophageal reflux disease without esophagitis: Secondary | ICD-10-CM

## 2022-03-03 NOTE — Progress Notes (Signed)
?Established patient visit ? ? ?Patient: Felicia Good   DOB: 1960-12-21   61 y.o. Female  MRN: [id number] ?Visit Date: 03/03/2022 ? ?Chief Complaint  ?Patient presents with  ? Hyperlipidemia  ? Gastroesophageal Reflux  ? ?Subjective  ?  ?HPI  ?Patient presents for follow up on mood, hyperlipidemia, GERD and IBS. ? ?Mood: Reports did not notice a difference with Wellbutrin regarding smoking so stopped medication a few days ago. Reports continues with Celexa daily and clonazepam as needed for severe anxiety. Denies labile mood or SI/HI.  ? ?HLD: Pt taking medication as directed without issues. Denies missing any doses. Reports has worked on eating smaller meals. Working on having a balanced diet.  ? ?IBS/GERD: Patient reports takes famotidine daily which keeps her symptoms be stable. Will have a flare-up if eats something that she shouldn't. Continue with bentyl. ? ? ? ? ?  03/03/2022  ?  9:57 AM 11/03/2021  ?  9:42 AM 09/03/2021  ? 10:22 AM 06/02/2021  ?  3:49 PM 01/30/2021  ?  1:18 PM  ?Depression screen PHQ 2/9  ?Decreased Interest 0 1 0 0 0  ?Down, Depressed, Hopeless 0 1 1 0 0  ?PHQ - 2 Score 0 2 1 0 0  ?Altered sleeping 0 0 0 0 0  ?Tired, decreased energy _0 ?Change in appetite 0 0 0 0 0  ?Feeling bad or failure about yourself  0 0 0 0 0  ?Trouble concentrating 0 0 0 0 0  ?Moving slowly or fidgety/restless 0 0 0 0 0  ?Suicidal thoughts 0 0 0 0 0  ?PHQ-9 Score _1 ?Difficult doing work/chores Not difficult at all Not difficult at all Not difficult at all Not difficult at all   ? ? ?  03/03/2022  ?  9:58 AM 11/03/2021  ?  9:42 AM 09/03/2021  ? 10:22 AM 06/02/2021  ?  3:49 PM  ?GAD 7 : Generalized Anxiety Score  ?Nervous, Anxious, on Edge _2 ?Control/stop worrying 0 0 0 0  ?Worry too much - different things 0 0 0 0  ?Trouble relaxing 1 1 0 1  ?Restless 0 0 0 0  ?Easily annoyed or irritable _3 ?Afraid - awful might happen 0 0 0 0  ?Total GAD 7 Score _4 ?Anxiety Difficulty Not difficult at all Not  difficult at all Not difficult at all Not difficult at all  ? ? ?  ? ? ?Medications: ?Outpatient Medications Prior to Visit  ?Medication Sig  ? ASPIRIN 81 PO Take by mouth.  ? atorvastatin (LIPITOR) 10 MG tablet TAKE 1 TABLET BY MOUTH DAILY.  ? buPROPion (WELLBUTRIN SR) 150 MG 12 hr tablet Take 1 tablet (150 mg total) by mouth 2 (two) times daily.  ? Cholecalciferol (VITAMIN D3) 2000 units TABS Take 1 tablet by mouth daily.  ? citalopram (CELEXA) 40 MG tablet Take 1 tablet (40 mg total) by mouth daily.  ? clonazePAM (KLONOPIN) 0.5 MG tablet Take 1 tablet (0.5 mg total) by mouth at bedtime as needed for anxiety.  ? cyclobenzaprine (FLEXERIL) 10 MG tablet Take 1 tablet (10 mg total) by mouth 3 (three) times daily as needed for muscle spasms.  ? dicyclomine (BENTYL) 20 MG tablet Take 1 tablet (20 mg total) by mouth 3 (three) times daily before meals.  ? famotidine (PEPCID) 20 MG tablet Take 1 tablet (20 mg total) by  mouth at bedtime.  ? fluticasone (FLONASE) 50 MCG/ACT nasal spray Place 1 spray into both nostrils 2 (two) times daily as needed for allergies or rhinitis.  ? pantoprazole (PROTONIX) 40 MG tablet Take 1 tablet (40 mg total) by mouth 2 (two) times daily before a meal.  ? Prenatal Vit-Fe Fumarate-FA (PRENATAL VITAMIN PO) Take 1 tablet by mouth daily.  ? TRIAMCINOLONE ACETONIDE EX 1 application Topical 2 times daily  ? ?No facility-administered medications prior to visit.  ? ? ?Review of Systems ?Review of Systems:  ?A fourteen system review of systems was performed and found to be positive as per HPI. ? ?Last CBC ?Lab Results  ?Component Value Date  ? WBC 6.4 11/03/2021  ? HGB 13.2 11/03/2021  ? HCT 38.7 11/03/2021  ? MCV 94 11/03/2021  ? MCH 32.0 11/03/2021  ? RDW 12.8 11/03/2021  ? PLT 212 11/03/2021  ? ?Last metabolic panel ?Lab Results  ?Component Value Date  ? GLUCOSE 89 11/03/2021  ? NA 140 11/03/2021  ? K 4.2 11/03/2021  ? CL 102 11/03/2021  ? CO2 26 11/03/2021  ? BUN 18 11/03/2021  ? CREATININE 0.81  11/03/2021  ? EGFR 83 11/03/2021  ? CALCIUM 9.6 11/03/2021  ? PROT 7.0 11/03/2021  ? ALBUMIN 4.7 11/03/2021  ? LABGLOB 2.3 11/03/2021  ? AGRATIO 2.0 11/03/2021  ? BILITOT 0.3 11/03/2021  ? ALKPHOS 77 11/03/2021  ? AST 28 11/03/2021  ? ALT 26 11/03/2021  ? ANIONGAP 9 09/08/2018  ? ?Last lipids ?Lab Results  ?Component Value Date  ? CHOL 184 11/03/2021  ? HDL 62 11/03/2021  ? LDLCALC 102 (H) 11/03/2021  ? LDLDIRECT 128.3 11/15/2012  ? TRIG 115 11/03/2021  ? CHOLHDL 3.0 11/03/2021  ? ?Last hemoglobin A1c ?Lab Results  ?Component Value Date  ? HGBA1C 5.5 11/03/2021  ? ?Last thyroid functions ?Lab Results  ?Component Value Date  ? TSH 2.410 11/03/2021  ? ?Last vitamin D ?Lab Results  ?Component Value Date  ? VD25OH 28.3 (L) 06/14/2017  ? ?  Objective  ?  ?BP (!) 84/55   Pulse 77   Temp 97.7 ?F (36.5 ?C)   Ht _0  (1.702 m)   Wt 137 lb (62.1 kg)   LMP 11/26/2013   SpO2 98%   BMI 21.46 kg/m?  ?BP Readings from Last 3 Encounters:  ?03/03/22 (!) 84/55  ?11/03/21 91/63  ?09/03/21 (!) 89/59  ? ?Wt Readings from Last 3 Encounters:  ?03/03/22 137 lb (62.1 kg)  ?11/03/21 141 lb (64 kg)  ?09/03/21 140 lb (63.5 kg)  ? ? ?Physical Exam  ?General:  Pleasant and cooperative, appropriate for stated age.  ?Neuro:  Alert and oriented,  extra-ocular muscles intact  ?HEENT:  Normocephalic, atraumatic, neck supple  ?Skin:  no gross rash, warm, pink. ?Cardiac:  RRR ?Respiratory: Speaking in full sentences, unlabored. ?Vascular:  Ext warm, no cyanosis apprec.; cap RF less 2 sec. ?Psych:  No HI/SI, judgement and insight good, Euthymic mood. Full Affect. ? ? ?No results found for any visits on 03/03/22. ? Assessment & Plan  ?  ? ? ?Problem List Items Addressed This Visit   ? ?  ? Digestive  ? Gastroesophageal reflux disease  ?  -Stable. Recommend to avoid provocative foods. Continue current medication regimen. Will continue to monitor. ? ?  ?  ? Irritable bowel syndrome  ?  -Stable. Continue current medication regimen. Will continue to  monitor. ? ?  ?  ?  ? Other  ? Depression  ?  -  PHQ-9 score of 1. Stable. ?-Continue current medication regimen. ?-Will continue to monitor. ? ?  ?  ? Hyperlipidemia - Primary  ?  -Last lipid panel: HDL 62, LDL 102 ?-Will repeat lipid panel and hepatic function today. ?-Continue current medication regimen. Pending lab results, if LDL remains elevated will consider medication adjustments. Recommend to continue with diet changes. ? ?  ?  ? Relevant Orders  ? Comp Met (CMET)  ? Lipid Profile  ? Anxiety  ?  -GAD-7 score of 3, patient's baseline. ?-Continue current medication regimen. Not due for clonazepam refill yet, last filled Nov 03 2021. ?-Will continue to monitor. ? ?  ?  ? ? ?Return in about 6 months (around 09/03/2022) for CPE.  ?   ? ? ? ?Lorrene Reid, PA-C  ?[facility] ?[phone] (phone) ?[phone] (fax) ? ?[facility] ?

## 2022-03-03 NOTE — Assessment & Plan Note (Signed)
-  Last lipid panel: HDL 62, LDL 102 ?-Will repeat lipid panel and hepatic function today. ?-Continue current medication regimen. Pending lab results, if LDL remains elevated will consider medication adjustments. Recommend to continue with diet changes. ?

## 2022-03-03 NOTE — Assessment & Plan Note (Signed)
-  Stable. Recommend to avoid provocative foods. Continue current medication regimen. Will continue to monitor. ?

## 2022-03-03 NOTE — Patient Instructions (Signed)
Allergies, Adult An allergy means that your body reacts to something that bothers it (allergen). This can happen from something that you eat, breathe in, or touch. Allergies often affect the nose, eyes, skin, and stomach. They can be mild, moderate, or very bad (severe). An allergy cannot spread from person to person. They can happen at any age. Sometimes, people outgrow them. What are the causes? Outdoor things, such as pollen, car fumes, and mold. Indoor things, such as dust, smoke, mold, and pets. Foods. Medicines. Things that bother your skin, such as perfume and bug bites. What increases the risk? Having family members with allergies or asthma. What are the signs or symptoms? Symptoms depend on how bad your allergy is. Mild to moderate symptoms Runny nose, stuffy nose, or sneezing. Itchy mouth, ears, or throat. A feeling of mucus dripping down the back of your throat. Sore throat. Eyes that are itchy, red, watery, or puffy. A skin rash, or red, swollen areas of skin (hives). Stomach cramps or bloating. Severe symptoms Very bad allergies to food, medicine, or bug bites may cause a very bad allergy reaction (anaphylaxis). This can be life-threatening. Symptoms include: A red face. Wheezing or coughing. Swollen lips, tongue, or mouth. Tight or swollen throat. Chest pain or tightness, or a fast heartbeat. Trouble breathing or shortness of breath. Pain in your belly (abdomen), vomiting, or watery poop (diarrhea). Feeling dizzy or fainting. How is this treated?     Treatment for this condition depends on your symptoms. Treatment may include: Cold, wet cloths for itching and swelling. Eye drops, nose sprays, or skin creams. Washing out your nose each day. A humidifier. Medicines. A change to the foods you eat. Being exposed again and again to tiny amounts of allergens. This helps your body get used to them. You might have: Allergy shots. Very small amounts of allergen put  under your tongue. An emergency shot (auto-injector pen) if you have a very bad allergy reaction. This is a medicine with a needle. You can put it into your skin by yourself. Your doctor will teach you how to use it. Follow these instructions at home: Medicines  Take or apply over-the-counter and prescription medicines only as told by your doctor. If you are at risk for a very bad allergy reaction, keep an auto-injector pen with you all the time. Eating and drinking Follow instructions from your doctor about what to eat and drink. Drink enough fluid to keep your pee (urine) pale yellow. General instructions If you have ever had a very bad allergy reaction, wear a medical alert bracelet or necklace. Stay away from things that you are allergic to. Keep all follow-up visits as told by your doctor. This is important. Contact a doctor if: Your symptoms do not get better with treatment. Get help right away if: You have symptoms of a very bad allergy reaction. These include: A swollen mouth, tongue, or throat. Pain or tightness in your chest. Trouble breathing. Being short of breath. Dizziness. Fainting. Very bad pain in your belly. Vomiting. Watery poop. These symptoms may be an emergency. Do not wait to see if the symptoms will go away. Get medical help right away. Call your local emergency services (911 in the [place] Do not drive yourself to the hospital. Summary Take or apply over-the-counter and prescription medicines only as told by your doctor. Stay away from things you are allergic to. If you are at risk for a very bad allergy reaction, carry an auto-injector pen all the time.   Wear a medical alert bracelet or necklace. Very bad allergy reactions can be life-threatening. Get help right away. This information is not intended to replace advice given to you by your health care provider. Make sure you discuss any questions you have with your health care provider. Document Revised:  08/23/2019 Document Reviewed: 08/23/2019 Elsevier Patient Education  2023 [employer]  

## 2022-03-03 NOTE — Assessment & Plan Note (Signed)
-  Stable. -Continue current medication regimen.  -Will continue to monitor. 

## 2022-03-03 NOTE — Assessment & Plan Note (Signed)
-  GAD-7 score of 3, patient's baseline. ?-Continue current medication regimen. Not due for clonazepam refill yet, last filled Nov 03 2021. ?-Will continue to monitor. ?

## 2022-03-03 NOTE — Assessment & Plan Note (Signed)
-  PHQ-9 score of 1. Stable. ?-Continue current medication regimen. ?-Will continue to monitor. ?

## 2022-03-04 LAB — LIPID PANEL
Chol/HDL Ratio: 2.9 ratio (ref 0.0–4.4)
Cholesterol, Total: 180 mg/dL (ref 100–199)
HDL: 62 mg/dL (ref >39)
LDL Chol Calc (NIH): 102 mg/dL — ABNORMAL HIGH (ref 0–99)
Triglycerides: 85 mg/dL (ref 0–149)
VLDL Cholesterol Cal: 16 mg/dL (ref 5–40)

## 2022-03-04 LAB — COMPREHENSIVE METABOLIC PANEL
ALT: 28 IU/L (ref 0–32)
AST: 24 IU/L (ref 0–40)
Albumin/Globulin Ratio: 1.9 (ref 1.2–2.2)
Albumin: 4.7 g/dL (ref 3.8–4.8)
Alkaline Phosphatase: 73 IU/L (ref 44–121)
BUN/Creatinine Ratio: 26 (ref 12–28)
BUN: 22 mg/dL (ref 8–27)
Bilirubin Total: 0.2 mg/dL (ref 0.0–1.2)
CO2: 26 mmol/L (ref 20–29)
Calcium: 10.1 mg/dL (ref 8.7–10.3)
Chloride: 105 mmol/L (ref 96–106)
Creatinine, Ser: 0.84 mg/dL (ref 0.57–1.00)
Globulin, Total: 2.5 g/dL (ref 1.5–4.5)
Glucose: 88 mg/dL (ref 70–99)
Potassium: 4.6 mmol/L (ref 3.5–5.2)
Sodium: 142 mmol/L (ref 134–144)
Total Protein: 7.2 g/dL (ref 6.0–8.5)
eGFR: 79 mL/min/{1.73_m2} (ref >59)

## 2022-04-06 ENCOUNTER — Ambulatory Visit: Payer: BC Managed Care – PPO | Admitting: Dermatology

## 2022-04-06 ENCOUNTER — Encounter: Payer: Self-pay | Admitting: Dermatology

## 2022-04-06 DIAGNOSIS — L814 Other melanin hyperpigmentation: Secondary | ICD-10-CM | POA: Diagnosis not present

## 2022-04-06 DIAGNOSIS — D1800 Hemangioma unspecified site: Secondary | ICD-10-CM

## 2022-04-06 DIAGNOSIS — L821 Other seborrheic keratosis: Secondary | ICD-10-CM

## 2022-04-06 DIAGNOSIS — D229 Melanocytic nevi, unspecified: Secondary | ICD-10-CM | POA: Diagnosis not present

## 2022-04-06 DIAGNOSIS — Z1283 Encounter for screening for malignant neoplasm of skin: Secondary | ICD-10-CM

## 2022-04-06 DIAGNOSIS — L578 Other skin changes due to chronic exposure to nonionizing radiation: Secondary | ICD-10-CM

## 2022-04-06 NOTE — Progress Notes (Signed)
   Follow-Up Visit   Subjective  Felicia Good is a 61 y.o. female who presents for the following: Annual Exam (Skin cancer screening. Full body. No hx of skin cancer or DN. Several areas of concern). The patient presents for Total-Body Skin Exam (TBSE) for skin cancer screening and mole check.  The patient has spots, moles and lesions to be evaluated, some may be new or changing and the patient has concerns that these could be cancer.  The following portions of the chart were reviewed this encounter and updated as appropriate:  Tobacco  Allergies  Meds  Problems  Med Hx  Surg Hx  Fam Hx     Review of Systems: No other skin or systemic complaints except as noted in HPI or Assessment and Plan.  Objective  Well appearing patient in no apparent distress; mood and affect are within normal limits.  A full examination was performed including scalp, head, eyes, ears, nose, lips, neck, chest, axillae, abdomen, back, buttocks, bilateral upper extremities, bilateral lower extremities, hands, feet, fingers, toes, fingernails, and toenails. All findings within normal limits unless otherwise noted below.   Assessment & Plan   Lentigines - Scattered tan macules - Due to sun exposure - Benign-appearing, observe - Recommend daily broad spectrum sunscreen SPF 30+ to sun-exposed areas, reapply every 2 hours as needed. - Call for any changes  Seborrheic Keratoses - Stuck-on, waxy, tan-brown papules and/or plaques  - Benign-appearing - Discussed benign etiology and prognosis. - Observe - Call for any changes  Melanocytic Nevi - Tan-brown and/or pink-flesh-colored symmetric macules and papules - Benign appearing on exam today - Observation - Call clinic for new or changing moles - Recommend daily use of broad spectrum spf 30+ sunscreen to sun-exposed areas.   Hemangiomas - Red papules - Discussed benign nature - Observe - Call for any changes  Actinic Damage - Chronic condition,  secondary to cumulative UV/sun exposure - diffuse scaly erythematous macules with underlying dyspigmentation - Recommend daily broad spectrum sunscreen SPF 30+ to sun-exposed areas, reapply every 2 hours as needed.  - Staying in the shade or wearing long sleeves, sun glasses (UVA+UVB protection) and wide brim hats (4-inch brim around the entire circumference of the hat) are also recommended for sun protection.  - Call for new or changing lesions.  Skin cancer screening performed today.  Skin cancer screening   Return in about 1 year (around 04/07/2023) for TBSE.  I, Emelia Salisbury, CMA, am acting as scribe for Sarina Ser, MD. Documentation: I have reviewed the above documentation for accuracy and completeness, and I agree with the above.  Sarina Ser, MD

## 2022-04-06 NOTE — Patient Instructions (Addendum)
Recommend daily broad spectrum sunscreen SPF 30+ to sun-exposed areas, reapply every 2 hours as needed. Call for new or changing lesions.  Staying in the shade or wearing long sleeves, sun glasses (UVA+UVB protection) and wide brim hats (4-inch brim around the entire circumference of the hat) are also recommended for sun protection.    Seborrheic Keratosis  What causes seborrheic keratoses? Seborrheic keratoses are harmless, common skin growths that first appear during adult life.  As time goes by, more growths appear.  Some people may develop a large number of them.  Seborrheic keratoses appear on both covered and uncovered body parts.  They are not caused by sunlight.  The tendency to develop seborrheic keratoses can be inherited.  They vary in color from skin-colored to gray, brown, or even black.  They can be either smooth or have a rough, warty surface.   Seborrheic keratoses are superficial and look as if they were stuck on the skin.  Under the microscope this type of keratosis looks like layers upon layers of skin.  That is why at times the top layer may seem to fall off, but the rest of the growth remains and re-grows.    Treatment Seborrheic keratoses do not need to be treated, but can easily be removed in the office.  Seborrheic keratoses often cause symptoms when they rub on clothing or jewelry.  Lesions can be in the way of shaving.  If they become inflamed, they can cause itching, soreness, or burning.  Removal of a seborrheic keratosis can be accomplished by freezing, burning, or surgery. If any spot bleeds, scabs, or grows rapidly, please return to have it checked, as these can be an indication of a skin cancer.  Melanoma ABCDEs  Melanoma is the most dangerous type of skin cancer, and is the leading cause of death from skin disease.  You are more likely to develop melanoma if you: Have light-colored skin, light-colored eyes, or red or blond hair Spend a lot of time in the sun Tan  regularly, either outdoors or in a tanning bed Have had blistering sunburns, especially during childhood Have a close family member who has had a melanoma Have atypical moles or large birthmarks  Early detection of melanoma is key since treatment is typically straightforward and cure rates are extremely high if we catch it early.   The first sign of melanoma is often a change in a mole or a new dark spot.  The ABCDE system is a way of remembering the signs of melanoma.  A for asymmetry:  The two halves do not match. B for border:  The edges of the growth are irregular. C for color:  A mixture of colors are present instead of an even brown color. D for diameter:  Melanomas are usually (but not always) greater than 6mm - the size of a pencil eraser. E for evolution:  The spot keeps changing in size, shape, and color.  Please check your skin once per month between visits. You can use a small mirror in front and a large mirror behind you to keep an eye on the back side or your body.   If you see any new or changing lesions before your next follow-up, please call to schedule a visit.  Please continue daily skin protection including broad spectrum sunscreen SPF 30+ to sun-exposed areas, reapplying every 2 hours as needed when you're outdoors.   Staying in the shade or wearing long sleeves, sun glasses (UVA+UVB protection) and wide brim   hats (4-inch brim around the entire circumference of the hat) are also recommended for sun protection.     Due to recent changes in healthcare laws, you may see results of your pathology and/or laboratory studies on MyChart before the doctors have had a chance to review them. We understand that in some cases there may be results that are confusing or concerning to you. Please understand that not all results are received at the same time and often the doctors may need to interpret multiple results in order to provide you with the best plan of care or course of  treatment. Therefore, we ask that you please give us 2 business days to thoroughly review all your results before contacting the office for clarification. Should we see a critical lab result, you will be contacted sooner.   If You Need Anything After Your Visit  If you have any questions or concerns for your doctor, please call our main line at [phone] and press option 4 to reach your doctor's medical assistant. If no one answers, please leave a voicemail as directed and we will return your call as soon as possible. Messages left after 4 pm will be answered the following business day.   You may also send us a message via MyChart. We typically respond to MyChart messages within 1-2 business days.  For prescription refills, please ask your pharmacy to contact our office. Our fax number is [phone].  If you have an urgent issue when the clinic is closed that cannot wait until the next business day, you can page your doctor at the number below.    Please note that while we do our best to be available for urgent issues outside of office hours, we are not available 24/7.   If you have an urgent issue and are unable to reach us, you may choose to seek medical care at your doctor's office, retail clinic, urgent care center, or emergency room.  If you have a medical emergency, please immediately call 911 or go to the emergency department.  Pager Numbers  - Dr. Kowalski: [phone]  - Dr. Moye: [phone]  - Dr. Stewart: [phone]  In the event of inclement weather, please call our main line at [phone] for an update on the status of any delays or closures.  Dermatology Medication Tips: Please keep the boxes that topical medications come in in order to help keep track of the instructions about where and how to use these. Pharmacies typically print the medication instructions only on the boxes and not directly on the medication tubes.   If your medication is too expensive,  please contact our office at [phone] option 4 or send us a message through MyChart.   We are unable to tell what your co-pay for medications will be in advance as this is different depending on your insurance coverage. However, we may be able to find a substitute medication at lower cost or fill out paperwork to get insurance to cover a needed medication.   If a prior authorization is required to get your medication covered by your insurance company, please allow us 1-2 business days to complete this process.  Drug prices often vary depending on where the prescription is filled and some pharmacies may offer cheaper prices.  The website www.goodrx.com contains coupons for medications through different pharmacies. The prices here do not account for what the cost may be with help from insurance (it may be cheaper with your insurance), but the website can give you   the price if you did not use any insurance.  - You can print the associated coupon and take it with your prescription to the pharmacy.  - You may also stop by our office during regular business hours and pick up a GoodRx coupon card.  - If you need your prescription sent electronically to a different pharmacy, notify our office through [facility] MyChart or by phone at [phone] option 4.     Si Usted Necesita Algo Despus de Su Visita  Tambin puede enviarnos un mensaje a travs de MyChart. Por lo general respondemos a los mensajes de MyChart en el transcurso de 1 a 2 das hbiles.  Para renovar recetas, por favor pida a su farmacia que se ponga en contacto con nuestra oficina. Nuestro nmero de fax es el [phone].  Si tiene un asunto urgente cuando la clnica est cerrada y que no puede esperar hasta el siguiente da hbil, puede llamar/localizar a su doctor(a) al nmero que aparece a continuacin.   Por favor, tenga en cuenta que aunque hacemos todo lo posible para estar disponibles para asuntos urgentes fuera del  horario de oficina, no estamos disponibles las 24 horas del da, los 7 das de la semana.   Si tiene un problema urgente y no puede comunicarse con nosotros, puede optar por buscar atencin mdica  en el consultorio de su doctor(a), en una clnica privada, en un centro de atencin urgente o en una sala de emergencias.  Si tiene una emergencia mdica, por favor llame inmediatamente al 911 o vaya a la sala de emergencias.  Nmeros de bper  - Dr. Kowalski: [phone]  - Dra. Moye: [phone]  - Dra. Stewart: [phone]  En caso de inclemencias del tiempo, por favor llame a nuestra lnea principal al [phone] para una actualizacin sobre el estado de cualquier retraso o cierre.  Consejos para la medicacin en dermatologa: Por favor, guarde las cajas en las que vienen los medicamentos de uso tpico para ayudarle a seguir las instrucciones sobre dnde y cmo usarlos. Las farmacias generalmente imprimen las instrucciones del medicamento slo en las cajas y no directamente en los tubos del medicamento.   Si su medicamento es muy caro, por favor, pngase en contacto con nuestra oficina llamando al [phone] y presione la opcin 4 o envenos un mensaje a travs de MyChart.   No podemos decirle cul ser su copago por los medicamentos por adelantado ya que esto es diferente dependiendo de la cobertura de su seguro. Sin embargo, es posible que podamos encontrar un medicamento sustituto a menor costo o llenar un formulario para que el seguro cubra el medicamento que se considera necesario.   Si se requiere una autorizacin previa para que su compaa de seguros cubra su medicamento, por favor permtanos de 1 a 2 das hbiles para completar este proceso.  Los precios de los medicamentos varan con frecuencia dependiendo del lugar de dnde se surte la receta y alguna farmacias pueden ofrecer precios ms baratos.  El sitio web www.goodrx.com tiene cupones para medicamentos de diferentes  farmacias. Los precios aqu no tienen en cuenta lo que podra costar con la ayuda del seguro (puede ser ms barato con su seguro), pero el sitio web puede darle el precio si no utiliz ningn seguro.  - Puede imprimir el cupn correspondiente y llevarlo con su receta a la farmacia.  - Tambin puede pasar por nuestra oficina durante el horario de atencin regular y recoger una tarjeta de cupones de GoodRx.  - Si necesita   Si necesita que su receta se enve electrnicamente a una farmacia diferente, informe a nuestra oficina a travs de [facility] o por telfono llamando al [phone] y presione la opcin 4.  

## 2022-04-07 ENCOUNTER — Telehealth: Payer: Self-pay | Admitting: Physician Assistant

## 2022-04-07 DIAGNOSIS — E785 Hyperlipidemia, unspecified: Secondary | ICD-10-CM

## 2022-04-07 MED ORDER — ATORVASTATIN CALCIUM 10 MG PO TABS: 10.0000 mg | ORAL_TABLET | Freq: Every day | ORAL | 0 refills | Status: DC

## 2022-04-07 NOTE — Telephone Encounter (Signed)
Patient request refill of Atorvastatin. Please advise.

## 2022-04-07 NOTE — Telephone Encounter (Signed)
Rx refill sent to pharmacy. AS, CMA 

## 2022-04-10 ENCOUNTER — Other Ambulatory Visit: Payer: Self-pay | Admitting: Physician Assistant

## 2022-04-10 DIAGNOSIS — E785 Hyperlipidemia, unspecified: Secondary | ICD-10-CM

## 2022-04-10 DIAGNOSIS — Z0000 Encounter for general adult medical examination without abnormal findings: Secondary | ICD-10-CM

## 2022-04-13 ENCOUNTER — Other Ambulatory Visit: Payer: BC Managed Care – PPO

## 2022-04-13 DIAGNOSIS — Z0000 Encounter for general adult medical examination without abnormal findings: Secondary | ICD-10-CM

## 2022-04-13 DIAGNOSIS — E785 Hyperlipidemia, unspecified: Secondary | ICD-10-CM

## 2022-04-14 LAB — LIPID PANEL
Chol/HDL Ratio: 2.6 ratio (ref 0.0–4.4)
Cholesterol, Total: 157 mg/dL (ref 100–199)
HDL: 61 mg/dL (ref >39)
LDL Chol Calc (NIH): 77 mg/dL (ref 0–99)
Triglycerides: 104 mg/dL (ref 0–149)
VLDL Cholesterol Cal: 19 mg/dL (ref 5–40)

## 2022-04-14 LAB — HEPATIC FUNCTION PANEL
ALT: 29 IU/L (ref 0–32)
AST: 27 IU/L (ref 0–40)
Albumin: 4.4 g/dL (ref 3.8–4.8)
Alkaline Phosphatase: 70 IU/L (ref 44–121)
Bilirubin Total: 0.3 mg/dL (ref 0.0–1.2)
Bilirubin, Direct: <0.1 mg/dL (ref 0.00–0.40)
Total Protein: 6.8 g/dL (ref 6.0–8.5)

## 2022-04-23 ENCOUNTER — Other Ambulatory Visit: Payer: Self-pay | Admitting: Physician Assistant

## 2022-04-23 DIAGNOSIS — F419 Anxiety disorder, unspecified: Secondary | ICD-10-CM

## 2022-04-23 DIAGNOSIS — F32A Depression, unspecified: Secondary | ICD-10-CM

## 2022-05-25 ENCOUNTER — Other Ambulatory Visit: Payer: Self-pay | Admitting: Physician Assistant

## 2022-05-25 DIAGNOSIS — E785 Hyperlipidemia, unspecified: Secondary | ICD-10-CM

## 2022-07-13 ENCOUNTER — Telehealth: Payer: Self-pay | Admitting: Physician Assistant

## 2022-07-13 DIAGNOSIS — E785 Hyperlipidemia, unspecified: Secondary | ICD-10-CM

## 2022-07-13 MED ORDER — ATORVASTATIN CALCIUM 20 MG PO TABS: 20.0000 mg | ORAL_TABLET | Freq: Every day | ORAL | 1 refills | Status: DC

## 2022-07-13 NOTE — Telephone Encounter (Signed)
Patient aware.

## 2022-07-13 NOTE — Telephone Encounter (Signed)
Patient needs new script of Atorvastatin with the new dose of 2 tablets daily before pharmacy will fill. They are saying it's too early because they have the old prescription on file. Please advise.

## 2022-07-23 ENCOUNTER — Telehealth: Payer: Self-pay

## 2022-07-30 DIAGNOSIS — Z01419 Encounter for gynecological examination (general) (routine) without abnormal findings: Secondary | ICD-10-CM | POA: Diagnosis not present

## 2022-07-30 DIAGNOSIS — Z1231 Encounter for screening mammogram for malignant neoplasm of breast: Secondary | ICD-10-CM | POA: Diagnosis not present

## 2022-08-05 NOTE — Telephone Encounter (Signed)
Note not needed 

## 2022-08-24 ENCOUNTER — Telehealth: Payer: Self-pay

## 2022-08-24 ENCOUNTER — Other Ambulatory Visit: Payer: Self-pay

## 2022-08-24 MED ORDER — PANTOPRAZOLE SODIUM 40 MG PO TBEC: 40.0000 mg | DELAYED_RELEASE_TABLET | Freq: Two times a day (BID) | ORAL | 3 refills | Status: DC

## 2022-08-24 NOTE — Telephone Encounter (Signed)
Pt is requesting a refill on: pantoprazole (PROTONIX) 40 MG tablet  Pharmacy: [facility], [place] 03/03/22 ROV 09/07/22

## 2022-08-24 NOTE — Telephone Encounter (Signed)
Rx was sent to pharmacy. 

## 2022-09-07 ENCOUNTER — Encounter: Payer: Self-pay | Admitting: Physician Assistant

## 2022-09-07 ENCOUNTER — Ambulatory Visit (INDEPENDENT_AMBULATORY_CARE_PROVIDER_SITE_OTHER): Payer: Commercial Managed Care - HMO | Admitting: Physician Assistant

## 2022-09-07 VITALS — BP 101/67 | HR 91 | Resp 18 | Ht 67.0 in | Wt 139.0 lb

## 2022-09-07 DIAGNOSIS — Z23 Encounter for immunization: Secondary | ICD-10-CM

## 2022-09-07 DIAGNOSIS — Z0000 Encounter for general adult medical examination without abnormal findings: Secondary | ICD-10-CM | POA: Diagnosis not present

## 2022-09-07 DIAGNOSIS — F410 Panic disorder [episodic paroxysmal anxiety] without agoraphobia: Secondary | ICD-10-CM | POA: Diagnosis not present

## 2022-09-07 MED ORDER — CLONAZEPAM 0.5 MG PO TABS: 0.5000 mg | ORAL_TABLET | Freq: Every evening | ORAL | 0 refills | Status: DC | PRN

## 2022-09-07 NOTE — Patient Instructions (Signed)
Preventive Care 40-62 Years Old, Female Preventive care refers to lifestyle choices and visits with your health care provider that can promote health and wellness. Preventive care visits are also called wellness exams. What can I expect for my preventive care visit? Counseling Your health care provider may ask you questions about your: Medical history, including: Past medical problems. Family medical history. Pregnancy history. Current health, including: Menstrual cycle. Method of birth control. Emotional well-being. Home life and relationship well-being. Sexual activity and sexual health. Lifestyle, including: Alcohol, nicotine or tobacco, and drug use. Access to firearms. Diet, exercise, and sleep habits. Work and work environment. Sunscreen use. Safety issues such as seatbelt and bike helmet use. Physical exam Your health care provider will check your: Height and weight. These may be used to calculate your BMI (body mass index). BMI is a measurement that tells if you are at a healthy weight. Waist circumference. This measures the distance around your waistline. This measurement also tells if you are at a healthy weight and may help predict your risk of certain diseases, such as type 2 diabetes and high blood pressure. Heart rate and blood pressure. Body temperature. Skin for abnormal spots. What immunizations do I need?  Vaccines are usually given at various ages, according to a schedule. Your health care provider will recommend vaccines for you based on your age, medical history, and lifestyle or other factors, such as travel or where you work. What tests do I need? Screening Your health care provider may recommend screening tests for certain conditions. This may include: Lipid and cholesterol levels. Diabetes screening. This is done by checking your blood sugar (glucose) after you have not eaten for a while (fasting). Pelvic exam and Pap test. Hepatitis B test. Hepatitis C  test. HIV (human immunodeficiency virus) test. STI (sexually transmitted infection) testing, if you are at risk. Lung cancer screening. Colorectal cancer screening. Mammogram. Talk with your health care provider about when you should start having regular mammograms. This may depend on whether you have a family history of breast cancer. BRCA-related cancer screening. This may be done if you have a family history of breast, ovarian, tubal, or peritoneal cancers. Bone density scan. This is done to screen for osteoporosis. Talk with your health care provider about your test results, treatment options, and if necessary, the need for more tests. Follow these instructions at home: Eating and drinking  Eat a diet that includes fresh fruits and vegetables, whole grains, lean protein, and low-fat dairy products. Take vitamin and mineral supplements as recommended by your health care provider. Do not drink alcohol if: Your health care provider tells you not to drink. You are pregnant, may be pregnant, or are planning to become pregnant. If you drink alcohol: Limit how much you have to 0-1 drink a day. Know how much alcohol is in your drink. In the [place] one drink equals one 12 oz bottle of beer (355 mL), one 5 oz glass of wine (148 mL), or one 1 oz glass of hard liquor (44 mL). Lifestyle Brush your teeth every morning and night with fluoride toothpaste. Floss one time each day. Exercise for at least 30 minutes 5 or more days each week. Do not use any products that contain nicotine or tobacco. These products include cigarettes, chewing tobacco, and vaping devices, such as e-cigarettes. If you need help quitting, ask your health care provider. Do not use drugs. If you are sexually active, practice safe sex. Use a condom or other form of protection to   prevent STIs. If you do not wish to become pregnant, use a form of birth control. If you plan to become pregnant, see your health care provider for a  prepregnancy visit. Take aspirin only as told by your health care provider. Make sure that you understand how much to take and what form to take. Work with your health care provider to find out whether it is safe and beneficial for you to take aspirin daily. Find healthy ways to manage stress, such as: Meditation, yoga, or listening to music. Journaling. Talking to a trusted person. Spending time with friends and family. Minimize exposure to UV radiation to reduce your risk of skin cancer. Safety Always wear your seat belt while driving or riding in a vehicle. Do not drive: If you have been drinking alcohol. Do not ride with someone who has been drinking. When you are tired or distracted. While texting. If you have been using any mind-altering substances or drugs. Wear a helmet and other protective equipment during sports activities. If you have firearms in your house, make sure you follow all gun safety procedures. Seek help if you have been physically or sexually abused. What's next? Visit your health care provider once a year for an annual wellness visit. Ask your health care provider how often you should have your eyes and teeth checked. Stay up to date on all vaccines. This information is not intended to replace advice given to you by your health care provider. Make sure you discuss any questions you have with your health care provider. Document Revised: 04/09/2021 Document Reviewed: 04/09/2021 Elsevier Patient Education  [employer].

## 2022-09-07 NOTE — Progress Notes (Signed)
Complete physical exam   Patient: Felicia Good   DOB: 04/17/1961   61 y.o. Female  MRN: [id number] Visit Date: 09/07/2022   Chief Complaint  Patient presents with   Annual Exam    Non fasting   Subjective    Felicia Good is a 61 y.o. female who presents today for a complete physical exam.  She reports consuming a general diet.  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  She generally feels fairly well. She does have additional problems to discuss today. Dry cough for 5 days and chest feels heavy. No fever, wheezing, chills, night sweats, or sore throat. Has taken Mucinex which has helped with headache and loosening up mucus.     Past Medical History:  Diagnosis Date   Anxiety    Depression    IBS (irritable bowel syndrome)    Migraine headache    Mitral valve prolapse    Panic attacks    Rectal polyp    RLS (restless legs syndrome)    Past Surgical History:  Procedure Laterality Date   rectal polyp removed  1987   Local anesthesia used   RECTAL POLYPECTOMY     Social History   Socioeconomic History   Marital status: Married    Spouse name: [name]   Number of children: 1   Years of education: Not on file   Highest education level: Not on file  Occupational History   Occupation: caregiver  Tobacco Use   Smoking status: Every Day    Packs/day: 0.25    Years: 40.00    Total pack years: 10.00    Types: Cigarettes   Smokeless tobacco: Never   Tobacco comments:    has cut back  Vaping Use   Vaping Use: Never used  Substance and Sexual Activity   Alcohol use: Yes    Alcohol/week: 0.0 standard drinks of alcohol    Comment: occ   Drug use: No   Sexual activity: Not Currently    Birth control/protection: None  Other Topics Concern   Not on file  Social History Narrative   Not employed    Married    One child       [place], walking with dog, she likes to go to [employer].    Social  Determinants of Health   Financial Resource Strain: Not on file  Food Insecurity: Not on file  Transportation Needs: Not on file  Physical Activity: Not on file  Stress: Not on file  Social Connections: Not on file  Intimate Partner Violence: Not on file     Medications: Outpatient Medications Prior to Visit  Medication Sig Note   ASPIRIN 81 PO Take by mouth.    atorvastatin (LIPITOR) 20 MG tablet Take 1 tablet (20 mg total) by mouth daily.    Cholecalciferol (VITAMIN D3) 2000 units TABS Take 1 tablet by mouth daily.    citalopram (CELEXA) 40 MG tablet TAKE 1 TABLET (40 MG TOTAL) BY MOUTH DAILY.    cyclobenzaprine (FLEXERIL) 10 MG tablet Take 1 tablet (10 mg total) by mouth 3 (three) times daily as needed for muscle spasms.    dicyclomine (BENTYL) 20 MG tablet Take 1 tablet (20 mg total) by mouth 3 (three) times daily before meals.    famotidine (PEPCID) 20 MG tablet Take 1 tablet (20 mg total) by mouth at bedtime.    fluticasone (FLONASE) 50 MCG/ACT nasal spray  Place 1 spray into both nostrils 2 (two) times daily as needed for allergies or rhinitis.    pantoprazole (PROTONIX) 40 MG tablet Take 1 tablet (40 mg total) by mouth 2 (two) times daily before a meal.    Prenatal Vit-Fe Fumarate-FA (PRENATAL VITAMIN PO) Take 1 tablet by mouth daily.    TRIAMCINOLONE ACETONIDE EX 1 application Topical 2 times daily    [DISCONTINUED] buPROPion (WELLBUTRIN SR) 150 MG 12 hr tablet Take 1 tablet (150 mg total) by mouth 2 (two) times daily. 09/07/2022: self-discontinued   [DISCONTINUED] clonazePAM (KLONOPIN) 0.5 MG tablet Take 1 tablet (0.5 mg total) by mouth at bedtime as needed for anxiety.    No facility-administered medications prior to visit.    Review of Systems Review of Systems:  A fourteen system review of systems was performed and found to be positive as per HPI.  Last CBC Lab Results  Component Value Date   WBC 6.4 11/03/2021   HGB 13.2 11/03/2021   HCT 38.7 11/03/2021   MCV 94  11/03/2021   MCH 32.0 11/03/2021   RDW 12.8 11/03/2021   PLT 212 76/28/3151   Last metabolic panel Lab Results  Component Value Date   GLUCOSE 88 03/03/2022   NA 142 03/03/2022   K 4.6 03/03/2022   CL 105 03/03/2022   CO2 26 03/03/2022   BUN 22 03/03/2022   CREATININE 0.84 03/03/2022   EGFR 79 03/03/2022   CALCIUM 10.1 03/03/2022   PROT 6.8 04/13/2022   ALBUMIN 4.4 04/13/2022   LABGLOB 2.5 03/03/2022   AGRATIO 1.9 03/03/2022   BILITOT 0.3 04/13/2022   ALKPHOS 70 04/13/2022   AST 27 04/13/2022   ALT 29 04/13/2022   ANIONGAP 9 09/08/2018   Last lipids Lab Results  Component Value Date   CHOL 157 04/13/2022   HDL 61 04/13/2022   LDLCALC 77 04/13/2022   LDLDIRECT 128.3 11/15/2012   TRIG 104 04/13/2022   CHOLHDL 2.6 04/13/2022   Last hemoglobin A1c Lab Results  Component Value Date   HGBA1C 5.5 11/03/2021   Last thyroid functions Lab Results  Component Value Date   TSH 2.410 11/03/2021   Last vitamin D Lab Results  Component Value Date   VD25OH 28.3 (L) 06/14/2017    Objective    BP 101/67 (BP Location: Right Arm, Patient Position: Sitting, Cuff Size: Normal)   Pulse 91   Resp 18   Ht _0  (1.702 m)   Wt 139 lb (63 kg)   LMP 11/26/2013   SpO2 95%   BMI 21.77 kg/m  BP Readings from Last 3 Encounters:  09/07/22 101/67  03/03/22 (!) 84/55  11/03/21 91/63   Wt Readings from Last 3 Encounters:  09/07/22 139 lb (63 kg)  03/03/22 137 lb (62.1 kg)  11/03/21 141 lb (64 kg)    Physical Exam   General Appearance:    Well developed, well nourished female. Alert, cooperative, in no acute distress, appears stated age   Head:    Normocephalic, without obvious abnormality, atraumatic  Eyes:    PERRL, conjunctiva/corneas clear, EOM's intact, fundi    benign, both eyes  Ears:    Normal TM's and external ear canals, both ears  Nose:   Nares normal, septum midline, mucosa normal, no drainage    or sinus tenderness  Throat:   Lips, mucosa, and tongue normal;  teeth and gums normal  Neck:   Supple, symmetrical, trachea midline, no adenopathy;    thyroid:  no enlargement/tenderness/nodules; no JVD  Back:     Symmetric, no curvature, ROM normal, no CVA tenderness  Lungs:     Clear to auscultation bilaterally, respirations unlabored  Chest Wall:    No tenderness or deformity   Heart:    Normal heart rate. Normal rhythm. No murmur.   Breast Exam:    deferred  Abdomen:     Soft, non-tender, bowel sounds active all four quadrants,    no masses, no organomegaly  Pelvic:    deferred  Extremities:   All extremities are intact. No cyanosis or edema  Pulses:   2+ and symmetric all extremities  Skin:   Skin color, texture, turgor normal, no rashes or suspicous lesions  Lymph nodes:   Cervical, supraclavicular nodes normal  Neurologic:   CNII-XII grossly intact.     Last depression screening scores    09/07/2022    1:48 PM 03/03/2022    9:57 AM 11/03/2021    9:42 AM  PHQ 2/9 Scores  PHQ - 2 Score 2 0 2  PHQ- 9 Score _0 Last fall risk screening    09/07/2022    1:49 PM  Roy in the past year? 1  Number falls in past yr: 0  Injury with Fall? 0     No results found for any visits on 09/07/22.  Assessment & Plan    Routine Health Maintenance and Physical Exam  Exercise Activities and Dietary recommendations -A heart healthy diet low in fat and carbohydrates. Recommend moderate exercise.  Immunization History  Administered Date(s) Administered   Influenza Split 09/02/2011, 07/07/2012   Influenza Whole 08/15/2007, 09/08/2010   Influenza,inj,Quad PF,6+ Mos 08/11/2013, 06/19/2014, 07/09/2015, 07/09/2016, 07/26/2017, 07/28/2018, 09/11/2019, 09/10/2020, 08/08/2021, 09/07/2022   Influenza-Unspecified 07/29/2016   PFIZER(Purple Top)SARS-COV-2 Vaccination 01/18/2020, 02/12/2020   PNEUMOCOCCAL CONJUGATE-20 11/03/2021   Td 10/26/2000   Tdap 10/06/2011, 11/03/2021   Zoster Recombinat (Shingrix) 08/19/2018, 10/20/2018    Health  Maintenance  Topic Date Due   HIV Screening  Never done   COVID-19 Vaccine (3 - Pfizer risk series) 03/11/2020   MAMMOGRAM  07/26/2023   PAP SMEAR-Modifier  07/25/2024   COLONOSCOPY (Pts 45-45yr Insurance coverage will need to be confirmed)  03/05/2025   TETANUS/TDAP  11/04/2031   INFLUENZA VACCINE  Completed   Hepatitis C Screening  Completed   Zoster Vaccines- Shingrix  Completed   HPV VACCINES  Aged Out    Discussed health benefits of physical activity, and encouraged her to engage in regular exercise appropriate for her age and condition.  Problem List Items Addressed This Visit       Other   PANIC DISORDER,NO AGORAPHOBIA   Relevant Medications   clonazePAM (KLONOPIN) 0.5 MG tablet   Healthcare maintenance - Primary   Other Visit Diagnoses     Need for influenza vaccination       Relevant Orders   Flu Vaccine QUAD 6+ mos PF IM (Fluarix Quad PF) (Completed)      Pt agreeable to flu vaccine. Recommend updating routine fasting labs at follow-up visit. UTD pap, mammogram, colonoscopy, Tdap.  Discussed with patient can get refills from PCP for pantoprazole and dicyclomine 20 mg TID. Patient will trial taking pantoprazole 20 mg twice daily instead of 40 mg twice daily. Discussed if IBS or GERD becomes uncontrolled then recommend to follow-up with GI, pt verbalized understanding. Follow-up in 4 months for reg OV- Mood, IBS, HLD and FBW  Return in about 4 months (around 01/06/2023) for Mood, IBS, HLD  and FBW.       Lorrene Reid, PA-C  [facility] [phone] (phone) [phone] (fax)  [place]

## 2022-10-06 ENCOUNTER — Other Ambulatory Visit: Payer: Self-pay | Admitting: Nurse Practitioner

## 2022-10-06 ENCOUNTER — Telehealth: Payer: Self-pay | Admitting: *Deleted

## 2022-10-06 DIAGNOSIS — K589 Irritable bowel syndrome without diarrhea: Secondary | ICD-10-CM

## 2022-10-06 MED ORDER — DICYCLOMINE HCL 20 MG PO TABS: 20.0000 mg | ORAL_TABLET | Freq: Three times a day (TID) | ORAL | 1 refills | Status: DC

## 2022-10-06 NOTE — Telephone Encounter (Signed)
Left detailed voice mail message informing patient that refill has been sent to pharmacy. Requested a return call with any questions or concerns.

## 2022-10-06 NOTE — Telephone Encounter (Signed)
Pt calling to request refill on below. She said that her and Herb Grays had talked about filling this medication through her as long as patient is doing well.  She said she is doing really well.  She said the medication cost is $150.00 to fill if sent in by GI.     Medication:dicyclomine (BENTYL) 20 MG tablet  Pharmacy: [facility]   LOV: 09/07/2022 ROV: 01/06/2023

## 2022-10-06 NOTE — Telephone Encounter (Signed)
Please let her know that is sent new prescription to [facility] for her. I'm not sure that there will be a difference in price because it came from me, but worth a shot.  Thanks  -HB

## 2022-11-30 ENCOUNTER — Other Ambulatory Visit: Payer: Self-pay | Admitting: Nurse Practitioner

## 2022-11-30 DIAGNOSIS — F32A Depression, unspecified: Secondary | ICD-10-CM

## 2022-11-30 DIAGNOSIS — F419 Anxiety disorder, unspecified: Secondary | ICD-10-CM

## 2022-11-30 NOTE — Telephone Encounter (Signed)
L.O.V: 09/07/22  N.O.V: 01/06/23  L.R.F: 04/23/22 90 tab 0 refill   30 day sent

## 2023-01-06 ENCOUNTER — Encounter: Payer: Self-pay | Admitting: Family Medicine

## 2023-01-06 ENCOUNTER — Ambulatory Visit (INDEPENDENT_AMBULATORY_CARE_PROVIDER_SITE_OTHER): Payer: Commercial Managed Care - HMO | Admitting: Family Medicine

## 2023-01-06 VITALS — BP 101/68 | HR 76 | Resp 18 | Ht 67.0 in | Wt 139.0 lb

## 2023-01-06 DIAGNOSIS — F419 Anxiety disorder, unspecified: Secondary | ICD-10-CM

## 2023-01-06 DIAGNOSIS — F410 Panic disorder [episodic paroxysmal anxiety] without agoraphobia: Secondary | ICD-10-CM

## 2023-01-06 DIAGNOSIS — F32A Depression, unspecified: Secondary | ICD-10-CM | POA: Diagnosis not present

## 2023-01-06 DIAGNOSIS — M545 Low back pain, unspecified: Secondary | ICD-10-CM

## 2023-01-06 DIAGNOSIS — G8929 Other chronic pain: Secondary | ICD-10-CM

## 2023-01-06 DIAGNOSIS — E785 Hyperlipidemia, unspecified: Secondary | ICD-10-CM | POA: Diagnosis not present

## 2023-01-06 DIAGNOSIS — K589 Irritable bowel syndrome without diarrhea: Secondary | ICD-10-CM

## 2023-01-06 DIAGNOSIS — K219 Gastro-esophageal reflux disease without esophagitis: Secondary | ICD-10-CM

## 2023-01-06 MED ORDER — CYCLOBENZAPRINE HCL 10 MG PO TABS
10.0000 mg | ORAL_TABLET | Freq: Three times a day (TID) | ORAL | 0 refills | Status: AC | PRN
Start: 2023-01-06 — End: ?

## 2023-01-06 MED ORDER — PANTOPRAZOLE SODIUM 40 MG PO TBEC: 40.0000 mg | DELAYED_RELEASE_TABLET | Freq: Two times a day (BID) | ORAL | 3 refills | Status: DC

## 2023-01-06 MED ORDER — CITALOPRAM HYDROBROMIDE 40 MG PO TABS: 40.0000 mg | ORAL_TABLET | Freq: Every day | ORAL | 2 refills | Status: DC

## 2023-01-06 MED ORDER — CLONAZEPAM 0.5 MG PO TABS: 0.5000 mg | ORAL_TABLET | Freq: Every evening | ORAL | 0 refills | Status: DC | PRN

## 2023-01-06 MED ORDER — ATORVASTATIN CALCIUM 20 MG PO TABS: 20.0000 mg | ORAL_TABLET | Freq: Every day | ORAL | 1 refills | Status: DC

## 2023-01-06 MED ORDER — DICYCLOMINE HCL 20 MG PO TABS: 20.0000 mg | ORAL_TABLET | Freq: Three times a day (TID) | ORAL | 1 refills | Status: DC

## 2023-01-06 MED ORDER — FAMOTIDINE 20 MG PO TABS: 20.0000 mg | ORAL_TABLET | Freq: Every day | ORAL | Status: DC

## 2023-01-06 NOTE — Assessment & Plan Note (Addendum)
Last lipid panel: HDL 61, LDL 77.  Repeating lipid panel and hepatic function, patient not fasting today so we will place orders and she will come back another morning when she is fasting.  Continue atorvastatin 20 mg daily, will adjust if lab results indicate this is necessary.  Encouraged to continue with low-fat diet.

## 2023-01-06 NOTE — Assessment & Plan Note (Addendum)
GAD-7 score of 8, slight increase correlates with increased stress that she has been feeling.  Stable.  Continue Celexa 40 mg daily.  Clonazepam last filled 09/07/2022, will send refill today.  We discussed that we can keep things as they are now, but if she does ever feel that we need to change the current management to help her cope with any changing circumstances, she can make an appointment to do that.  Patient verbalized understanding and is agreeable to this plan.  Will continue to monitor.

## 2023-01-06 NOTE — Progress Notes (Signed)
Established Patient Office Visit  Subjective   Patient ID: Felicia Good, female    DOB: 28-Jan-1961  Age: 62 y.o. MRN: [id number]  Chief Complaint  Patient presents with   Anxiety   Medical Management of Chronic Issues   Depression   Hyperlipidemia   Irritable Bowel Syndrome    HPI Felicia Good is a 62 y.o. female presenting today for follow up of mood, hyperlipidemia, IBS. Mood: Patient is here to follow up for anxiety and depression. she is currently managing with Celexa. Taking medication without side effects, reports excellent compliance with treatment. Denies mood changes or SI/HI. she feels mood is stable since last visit.  She still has a a lot of stress in her life with her mom being in hospice care, she sleeps whenever she can and is doing her best to manage.  She knows that she will want to join a grief support group when the time comes.  She is able to manage with the daily Celexa and half a Klonopin if she does ever need to leave the house. Denies chest pain, difficulty concentrating, dizziness, fatigue, insomnia, irritability, palpitations, panic attacks, racing thoughts, SOB, sweating. Denies anhedonia, depressed mood, difficulty concentrating, fatigue, feelings of worthlessness/guilt, hopelessness, hypersomnia, impaired memory, insomnia, psychomotor agitation, psychomotor retardation, recurrent thoughts of death, weight changes.     18-Jan-2023    8:44 AM 09/07/2022    1:48 PM 03/03/2022    9:57 AM  Depression screen PHQ 2/9  Decreased Interest 0 1 0  Down, Depressed, Hopeless 0 1 0  PHQ - 2 Score 0 2 0  Altered sleeping 2 1 0  Tired, decreased energy 0 1 1  Change in appetite 0 1 0  Feeling bad or failure about yourself  0 0 0  Trouble concentrating 0 0 0  Moving slowly or fidgety/restless 0 0 0  Suicidal thoughts 0 0 0  PHQ-9 Score '2 5 1  '$ Difficult doing work/chores Not difficult at all Somewhat difficult Not difficult at all       01-18-2023    8:44 AM  09/07/2022    1:49 PM 03/03/2022    9:58 AM 11/03/2021    9:42 AM  GAD 7 : Generalized Anxiety Score  Nervous, Anxious, on Edge '2 1 1 1  '$ Control/stop worrying 1 1 0 0  Worry too much - different things 1 1 0 0  Trouble relaxing '1 1 1 1  '$ Restless 1 1 0 0  Easily annoyed or irritable '1 1 1 1  '$ Afraid - awful might happen 1 0 0 0  Total GAD 7 Score '8 6 3 3  '$ Anxiety Difficulty Somewhat difficult Somewhat difficult Not difficult at all Not difficult at all   Hyperlipidemia: tolerating atorvastatin well with no myalgias or significant side effects.  The 10-year ASCVD risk score (Arnett DK, et al., 2019) is: 4.5% IBS: Currently managing with dicyclomine 20 mg.  Keeping her mood stable with the Celexa also helps.  ROS Negative unless otherwise noted in HPI   Objective:     BP 101/68 (BP Location: Left Arm, Patient Position: Sitting, Cuff Size: Normal)   Pulse 76   Resp 18   Ht '5\' 7"'$  (1.702 m)   Wt 139 lb (63 kg)   LMP 11/26/2013   SpO2 100%   BMI 21.77 kg/m   Physical Exam Constitutional:      General: She is not in acute distress.    Appearance: Normal appearance.  HENT:  Head: Normocephalic and atraumatic.  Cardiovascular:     Rate and Rhythm: Normal rate and regular rhythm.     Pulses: Normal pulses.     Heart sounds: No murmur heard.    No friction rub. No gallop.  Pulmonary:     Effort: Pulmonary effort is normal. No respiratory distress.     Breath sounds: No wheezing, rhonchi or rales.  Skin:    General: Skin is warm and dry.  Neurological:     Mental Status: She is alert and oriented to person, place, and time.     Assessment & Plan:  Irritable bowel syndrome, unspecified type Assessment & Plan: Stable.  Continue dicyclomine 20 mg up to 4 times a day.  Will continue to monitor.  Orders: -     Dicyclomine HCl; Take 1 tablet (20 mg total) by mouth 3 (three) times daily before meals.  Dispense: 270 tablet; Refill: 1 -     Famotidine; Take 1 tablet (20 mg  total) by mouth at bedtime. -     Pantoprazole Sodium; Take 1 tablet (40 mg total) by mouth 2 (two) times daily before a meal.  Dispense: 180 tablet; Refill: 3  Hyperlipidemia, unspecified hyperlipidemia type Assessment & Plan: Last lipid panel: HDL 61, LDL 77.  Repeating lipid panel and hepatic function, patient not fasting today so we will place orders and she will come back another morning when she is fasting.  Continue atorvastatin 20 mg daily, will adjust if lab results indicate this is necessary.  Encouraged to continue with low-fat diet.  Orders: -     Lipid panel; Future -     Comprehensive metabolic panel; Future -     Atorvastatin Calcium; Take 1 tablet (20 mg total) by mouth daily.  Dispense: 90 tablet; Refill: 1  Anxiety Assessment & Plan: GAD-7 score of 8, slight increase correlates with increased stress that she has been feeling.  Stable.  Continue Celexa 40 mg daily.  Clonazepam last filled 09/07/2022, will send refill today.  We discussed that we can keep things as they are now, but if she does ever feel that we need to change the current management to help her cope with any changing circumstances, she can make an appointment to do that.  Patient verbalized understanding and is agreeable to this plan.  Will continue to monitor.  Orders: -     Citalopram Hydrobromide; Take 1 tablet (40 mg total) by mouth daily.  Dispense: 30 tablet; Refill: 2  Depression, unspecified depression type Assessment & Plan: PHQ-9 score of 2.  Stable.  Continue Celexa 40 mg daily.  Will continue to monitor.  Orders: -     Citalopram Hydrobromide; Take 1 tablet (40 mg total) by mouth daily.  Dispense: 30 tablet; Refill: 2  PANIC DISORDER,NO AGORAPHOBIA -     clonazePAM; Take 1 tablet (0.5 mg total) by mouth at bedtime as needed for anxiety.  Dispense: 30 tablet; Refill: 0  Chronic bilateral low back pain, unspecified whether sciatica present -     Cyclobenzaprine HCl; Take 1 tablet (10 mg total)  by mouth 3 (three) times daily as needed for muscle spasms.  Dispense: 30 tablet; Refill: 0  Gastroesophageal reflux disease, unspecified whether esophagitis present -     Famotidine; Take 1 tablet (20 mg total) by mouth at bedtime. -     Pantoprazole Sodium; Take 1 tablet (40 mg total) by mouth 2 (two) times daily before a meal.  Dispense: 180 tablet; Refill: 3  Return in about 4 months (around 05/08/2023) for follow-up.    Velva Harman, PA

## 2023-01-06 NOTE — Assessment & Plan Note (Addendum)
PHQ-9 score of 2.  Stable.  Continue Celexa 40 mg daily.  Will continue to monitor.

## 2023-01-06 NOTE — Patient Instructions (Signed)
Your labs have been ordered and will be waiting for you, so come in whenever is convenient for you!

## 2023-01-06 NOTE — Assessment & Plan Note (Signed)
Stable.  Continue dicyclomine 20 mg up to 4 times a day.  Will continue to monitor.

## 2023-01-13 ENCOUNTER — Other Ambulatory Visit: Payer: Commercial Managed Care - HMO

## 2023-01-13 DIAGNOSIS — E785 Hyperlipidemia, unspecified: Secondary | ICD-10-CM

## 2023-01-14 LAB — LIPID PANEL
Chol/HDL Ratio: 2.7 ratio (ref 0.0–4.4)
Cholesterol, Total: 174 mg/dL (ref 100–199)
HDL: 65 mg/dL (ref >39)
LDL Chol Calc (NIH): 91 mg/dL (ref 0–99)
Triglycerides: 100 mg/dL (ref 0–149)
VLDL Cholesterol Cal: 18 mg/dL (ref 5–40)

## 2023-01-14 LAB — COMPREHENSIVE METABOLIC PANEL
ALT: 37 IU/L — ABNORMAL HIGH (ref 0–32)
AST: 28 IU/L (ref 0–40)
Albumin/Globulin Ratio: 1.7 (ref 1.2–2.2)
Albumin: 4.5 g/dL (ref 3.9–4.9)
Alkaline Phosphatase: 85 IU/L (ref 44–121)
BUN/Creatinine Ratio: 22 (ref 12–28)
BUN: 17 mg/dL (ref 8–27)
Bilirubin Total: 0.5 mg/dL (ref 0.0–1.2)
CO2: 24 mmol/L (ref 20–29)
Calcium: 9.8 mg/dL (ref 8.7–10.3)
Chloride: 103 mmol/L (ref 96–106)
Creatinine, Ser: 0.79 mg/dL (ref 0.57–1.00)
Globulin, Total: 2.6 g/dL (ref 1.5–4.5)
Glucose: 92 mg/dL (ref 70–99)
Potassium: 4.2 mmol/L (ref 3.5–5.2)
Sodium: 142 mmol/L (ref 134–144)
Total Protein: 7.1 g/dL (ref 6.0–8.5)
eGFR: 84 mL/min/{1.73_m2} (ref >59)

## 2023-04-08 ENCOUNTER — Encounter: Payer: BC Managed Care – PPO | Admitting: Dermatology

## 2023-05-10 ENCOUNTER — Encounter: Payer: Self-pay | Admitting: Family Medicine

## 2023-05-10 ENCOUNTER — Ambulatory Visit (INDEPENDENT_AMBULATORY_CARE_PROVIDER_SITE_OTHER): Payer: Commercial Managed Care - HMO | Admitting: Family Medicine

## 2023-05-10 DIAGNOSIS — E785 Hyperlipidemia, unspecified: Secondary | ICD-10-CM

## 2023-05-10 DIAGNOSIS — F410 Panic disorder [episodic paroxysmal anxiety] without agoraphobia: Secondary | ICD-10-CM

## 2023-05-10 DIAGNOSIS — F419 Anxiety disorder, unspecified: Secondary | ICD-10-CM | POA: Diagnosis not present

## 2023-05-10 DIAGNOSIS — K589 Irritable bowel syndrome without diarrhea: Secondary | ICD-10-CM

## 2023-05-10 DIAGNOSIS — F32A Depression, unspecified: Secondary | ICD-10-CM | POA: Diagnosis not present

## 2023-05-10 MED ORDER — CITALOPRAM HYDROBROMIDE 40 MG PO TABS
40.0000 mg | ORAL_TABLET | Freq: Every day | ORAL | 1 refills | Status: DC
Start: 2023-05-10 — End: 2023-11-15

## 2023-05-10 MED ORDER — ATORVASTATIN CALCIUM 20 MG PO TABS
20.0000 mg | ORAL_TABLET | Freq: Every day | ORAL | 1 refills | Status: DC
Start: 2023-05-10 — End: 2023-11-15

## 2023-05-10 MED ORDER — BUSPIRONE HCL 10 MG PO TABS
10.0000 mg | ORAL_TABLET | Freq: Two times a day (BID) | ORAL | 1 refills | Status: DC
Start: 2023-05-10 — End: 2023-11-15

## 2023-05-10 MED ORDER — CLONAZEPAM 0.5 MG PO TABS
0.5000 mg | ORAL_TABLET | Freq: Every evening | ORAL | 0 refills | Status: DC | PRN
Start: 2023-05-10 — End: 2023-06-11

## 2023-05-10 MED ORDER — DICYCLOMINE HCL 20 MG PO TABS
20.0000 mg | ORAL_TABLET | Freq: Three times a day (TID) | ORAL | 1 refills | Status: DC
Start: 2023-05-10 — End: 2023-11-15

## 2023-05-10 NOTE — Assessment & Plan Note (Signed)
PHQ-9 score of 3, fairly stable.  Continue Celexa 40 mg daily.  Will continue to monitor.

## 2023-05-10 NOTE — Assessment & Plan Note (Signed)
Stable.  Continue dicyclomine 20 mg up to 4 times a day.  Will continue to monitor.

## 2023-05-10 NOTE — Assessment & Plan Note (Signed)
PDMP reviewed, no aberrancies.  Refill of clonazepam 0.5 mg tablets sent in today.

## 2023-05-10 NOTE — Progress Notes (Signed)
Established Patient Office Visit  Subjective   Patient ID: Felicia Good, female    DOB: 08-19-1961  Age: 62 y.o. MRN: [id number]  Chief Complaint  Patient presents with   Irritable Bowel Syndrome   Anxiety    HPI Felicia Good is a 62 y.o. female presenting today for follow up of hyperlipidemia, IBS, mood. Hyperlipidemia: tolerating atorvastatin well with no myalgias or significant side effects.  The 10-year ASCVD risk score (Arnett DK, et al., 2019) is: 7% IBS: Currently managing with dicyclomine 20 mg.  Keeping her mood stable with the Celexa also helps.  Mood: Patient is here to follow up for anxiety and depression, currently managing with Celexa daily, she also takes half of Klonopin if she ever leaves the house. Taking medication without side effects, reports excellent compliance with treatment. Denies mood changes or SI/HI. She feels mood is worse since last visit due to stressors with family life.  Her mom passed away in 03-Mar-2023, and she has been going to a grief group a couple of times a week which she has found helpful.  Her biggest stressors now also involve a restraining order against her son-in-law and needing to support her daughter and granddaughter.     05/10/2023    8:52 AM 01/06/2023    8:44 AM 09/07/2022    1:48 PM  Depression screen PHQ 2/9  Decreased Interest 1 0 1  Down, Depressed, Hopeless 0 0 1  PHQ - 2 Score 1 0 2  Altered sleeping 1 2 1   Tired, decreased energy 1 0 1  Change in appetite 0 0 1  Feeling bad or failure about yourself  0 0 0  Trouble concentrating 0 0 0  Moving slowly or fidgety/restless 0 0 0  Suicidal thoughts 0 0 0  PHQ-9 Score 3 2 5   Difficult doing work/chores Not difficult at all Not difficult at all Somewhat difficult       05/10/2023    8:53 AM 01/06/2023    8:44 AM 09/07/2022    1:49 PM 03/03/2022    9:58 AM  GAD 7 : Generalized Anxiety Score  Nervous, Anxious, on Edge 2 2 1 1   Control/stop worrying 1 1 1  0  Worry too much -  different things 1 1 1  0  Trouble relaxing 1 1 1 1   Restless 0 1 1 0  Easily annoyed or irritable 2 1 1 1   Afraid - awful might happen 3 1 0 0  Total GAD 7 Score 10 8 6 3   Anxiety Difficulty Somewhat difficult Somewhat difficult Somewhat difficult Not difficult at all   ROS Negative unless otherwise noted in HPI   Objective:     BP 125/86 (BP Location: Left Arm, Patient Position: Sitting, Cuff Size: Normal)   Pulse 92   Resp 18   Ht 5\' 7"  (1.702 m)   Wt 146 lb (66.2 kg)   LMP 11/26/2013   SpO2 98%   BMI 22.87 kg/m   Physical Exam Constitutional:      General: She is not in acute distress.    Appearance: Normal appearance.  HENT:     Head: Normocephalic and atraumatic.  Pulmonary:     Effort: Pulmonary effort is normal. No respiratory distress.  Musculoskeletal:     Cervical back: Normal range of motion.  Neurological:     General: No focal deficit present.     Mental Status: She is alert and oriented to person, place, and time. Mental status is at  baseline.  Psychiatric:        Mood and Affect: Mood normal.        Thought Content: Thought content normal.        Judgment: Judgment normal.     Assessment & Plan:  Anxiety Assessment & Plan: GAD-7 score of 10, slight increase correlated with increased stress.  Continue Celexa 40 mg daily and clonazepam as needed for breakthrough anxiety.  We also discussed a trial of buspirone 10 mg twice daily hoping to decrease her anxiety.  She is open to a trial of this.  Sent in prescription for buspirone 10 mg twice daily, follow-up in about 2 months to assess efficacy.  Orders: -     Citalopram Hydrobromide; Take 1 tablet (40 mg total) by mouth daily.  Dispense: 90 tablet; Refill: 1 -     busPIRone HCl; Take 1 tablet (10 mg total) by mouth 2 (two) times daily.  Dispense: 180 tablet; Refill: 1  Depression, unspecified depression type Assessment & Plan: PHQ-9 score of 3, fairly stable.  Continue Celexa 40 mg daily.  Will  continue to monitor.  Orders: -     Citalopram Hydrobromide; Take 1 tablet (40 mg total) by mouth daily.  Dispense: 90 tablet; Refill: 1  Hyperlipidemia, unspecified hyperlipidemia type Assessment & Plan: Last lipid panel: LDL 91, HDL 65, triglycerides 100.  Continue atorvastatin 20 mg daily, she is also going to get back on track with what she is eating.  Will continue to monitor.  Orders: -     Atorvastatin Calcium; Take 1 tablet (20 mg total) by mouth daily.  Dispense: 90 tablet; Refill: 1  Irritable bowel syndrome, unspecified type Assessment & Plan: Stable.  Continue dicyclomine 20 mg up to 4 times a day.  Will continue to monitor.  Orders: -     Dicyclomine HCl; Take 1 tablet (20 mg total) by mouth 3 (three) times daily before meals.  Dispense: 270 tablet; Refill: 1  PANIC DISORDER,NO AGORAPHOBIA Assessment & Plan: PDMP reviewed, no aberrancies.  Refill of clonazepam 0.5 mg tablets sent in today.  Orders: -     clonazePAM; Take 1 tablet (0.5 mg total) by mouth at bedtime as needed for anxiety.  Dispense: 30 tablet; Refill: 0    Return in about 8 weeks (around 07/05/2023) for follow-up for anxiety, starting buspirone (in person or video).    Melida Quitter, PA

## 2023-05-10 NOTE — Assessment & Plan Note (Signed)
GAD-7 score of 10, slight increase correlated with increased stress.  Continue Celexa 40 mg daily and clonazepam as needed for breakthrough anxiety.  We also discussed a trial of buspirone 10 mg twice daily hoping to decrease her anxiety.  She is open to a trial of this.  Sent in prescription for buspirone 10 mg twice daily, follow-up in about 2 months to assess efficacy.

## 2023-05-10 NOTE — Patient Instructions (Signed)
Start by taking buspirone 1 tablet daily in the morning to see how you tolerate it.  After a few days if you are tolerating it well, you can increase to 1 tablet daily in the morning and 1 tablet daily in the evening.

## 2023-05-10 NOTE — Assessment & Plan Note (Signed)
Last lipid panel: LDL 91, HDL 65, triglycerides 100.  Continue atorvastatin 20 mg daily, she is also going to get back on track with what she is eating.  Will continue to monitor.

## 2023-06-09 ENCOUNTER — Ambulatory Visit: Payer: BC Managed Care – PPO | Admitting: Dermatology

## 2023-06-11 ENCOUNTER — Telehealth: Payer: Self-pay | Admitting: *Deleted

## 2023-06-11 ENCOUNTER — Other Ambulatory Visit: Payer: Self-pay | Admitting: Family Medicine

## 2023-06-11 DIAGNOSIS — F410 Panic disorder [episodic paroxysmal anxiety] without agoraphobia: Secondary | ICD-10-CM

## 2023-06-11 MED ORDER — CLONAZEPAM 0.5 MG PO TABS
0.5000 mg | ORAL_TABLET | Freq: Every evening | ORAL | 0 refills | Status: AC | PRN
Start: 2023-06-11 — End: ?

## 2023-06-11 NOTE — Telephone Encounter (Signed)
Pt called and said that [facility] is not taking [employer] any longer and she is wanting her medications to go [facility].  I called [place] to see if they could transfer her medications and they said yes but that she was due for a refill on clonazepam and it would be easier if we just sent directly to [facility] for this. Please advise.

## 2023-06-11 NOTE — Telephone Encounter (Signed)
Clonazapam was sent in.

## 2023-07-12 ENCOUNTER — Ambulatory Visit (INDEPENDENT_AMBULATORY_CARE_PROVIDER_SITE_OTHER): Payer: Commercial Managed Care - HMO | Admitting: Family Medicine

## 2023-07-12 ENCOUNTER — Encounter: Payer: Self-pay | Admitting: Family Medicine

## 2023-07-12 VITALS — BP 125/78 | HR 57 | Resp 18 | Ht 67.0 in | Wt 155.0 lb

## 2023-07-12 DIAGNOSIS — R5383 Other fatigue: Secondary | ICD-10-CM

## 2023-07-12 DIAGNOSIS — F3341 Major depressive disorder, recurrent, in partial remission: Secondary | ICD-10-CM | POA: Diagnosis not present

## 2023-07-12 DIAGNOSIS — F419 Anxiety disorder, unspecified: Secondary | ICD-10-CM

## 2023-07-12 DIAGNOSIS — R0609 Other forms of dyspnea: Secondary | ICD-10-CM

## 2023-07-12 DIAGNOSIS — R635 Abnormal weight gain: Secondary | ICD-10-CM | POA: Diagnosis not present

## 2023-07-12 MED ORDER — ARIPIPRAZOLE 2 MG PO TABS
2.0000 mg | ORAL_TABLET | Freq: Every day | ORAL | 2 refills | Status: DC
Start: 2023-07-12 — End: 2023-11-15

## 2023-07-12 NOTE — Assessment & Plan Note (Signed)
PHQ-9 score 12, significantly worse.  Continue Celexa 40 mg daily.  Will augment with addition of aripiprazole 2 mg daily, follow-up in 2 months.  Will continue to monitor.

## 2023-07-12 NOTE — Patient Instructions (Signed)
You should get a call to schedule the echocardiogram. The order has already been placed for a chest x-ray, so you can walk-in to [facility] imaging in the next day or 2 and they will already have the orders!  I have placed the lab orders, so you can schedule that for tomorrow morning.

## 2023-07-12 NOTE — Progress Notes (Signed)
Established Patient Office Visit  Subjective   Patient ID: Felicia Good, female    DOB: December 24, 1960  Age: 62 y.o. MRN: [id number]  Chief Complaint  Patient presents with   Anxiety   Depression   Fatigue         HPI Felicia Good is a 62 y.o. female presenting today for follow up of mood.  She also endorses about 2 weeks of increased fatigue completing simple tasks, dyspnea on exertion, and weight gain.  She denies fever, chills, chest pain, palpitations, abdominal pain, nausea/vomiting, headache, dizziness.  She does endorse mild swelling in her legs bilaterally. Mood: Patient is here to follow up for depression and anxiety, currently managing with citalopram 40 mg daily, also added buspirone 10 mg twice daily at last appointment. Taking medication without side effects, reports excellent compliance with treatment. Denies mood changes or SI/HI. She feels mood is worse since last visit.  She has found that the buspirone has been slightly helpful for anxiety, but since last appointment she has a lower mood, is not interested in things, and often bursts into tears.  Her best friend is moving to [place] which she feels has sent her into a tailspin.  She is also working on smoking cessation and is on day 2 of using nicotine patches.     07/12/2023    3:15 PM 05/10/2023    8:52 AM 01/06/2023    8:44 AM  Depression screen PHQ 2/9  Decreased Interest 2 1 0  Down, Depressed, Hopeless 2 0 0  PHQ - 2 Score 4 1 0  Altered sleeping 2 1 2   Tired, decreased energy 3 1 0  Change in appetite 2 0 0  Feeling bad or failure about yourself  1 0 0  Trouble concentrating 0 0 0  Moving slowly or fidgety/restless 0 0 0  Suicidal thoughts 0 0 0  PHQ-9 Score 12 3 2   Difficult doing work/chores Somewhat difficult Not difficult at all Not difficult at all       07/12/2023    3:15 PM 05/10/2023    8:53 AM 01/06/2023    8:44 AM 09/07/2022    1:49 PM  GAD 7 : Generalized Anxiety Score  Nervous, Anxious, on  Edge 2 2 2 1   Control/stop worrying 1 1 1 1   Worry too much - different things 1 1 1 1   Trouble relaxing 1 1 1 1   Restless 0 0 1 1  Easily annoyed or irritable 2 2 1 1   Afraid - awful might happen 2 3 1  0  Total GAD 7 Score 9 10 8 6   Anxiety Difficulty Somewhat difficult Somewhat difficult Somewhat difficult Somewhat difficult     Outpatient Medications Prior to Visit  Medication Sig   ASPIRIN 81 PO Take by mouth.   atorvastatin (LIPITOR) 20 MG tablet Take 1 tablet (20 mg total) by mouth daily.   busPIRone (BUSPAR) 10 MG tablet Take 1 tablet (10 mg total) by mouth 2 (two) times daily.   Cholecalciferol (VITAMIN D3) 2000 units TABS Take 1 tablet by mouth daily.   citalopram (CELEXA) 40 MG tablet Take 1 tablet (40 mg total) by mouth daily.   clonazePAM (KLONOPIN) 0.5 MG tablet Take 1 tablet (0.5 mg total) by mouth at bedtime as needed for anxiety.   cyclobenzaprine (FLEXERIL) 10 MG tablet Take 1 tablet (10 mg total) by mouth 3 (three) times daily as needed for muscle spasms.   dicyclomine (BENTYL) 20 MG tablet Take 1  tablet (20 mg total) by mouth 3 (three) times daily before meals.   famotidine (PEPCID) 20 MG tablet Take 1 tablet (20 mg total) by mouth at bedtime.   fluticasone (FLONASE) 50 MCG/ACT nasal spray Place 1 spray into both nostrils 2 (two) times daily as needed for allergies or rhinitis.   pantoprazole (PROTONIX) 40 MG tablet Take 1 tablet (40 mg total) by mouth 2 (two) times daily before a meal.   Prenatal Vit-Fe Fumarate-FA (PRENATAL VITAMIN PO) Take 1 tablet by mouth daily.   TRIAMCINOLONE ACETONIDE EX 1 application Topical 2 times daily   No facility-administered medications prior to visit.    ROS Negative unless otherwise noted in HPI   Objective:     BP 125/78 (BP Location: Left Arm, Patient Position: Sitting, Cuff Size: Normal)   Pulse (!) 57   Resp 18   Ht 5\' 7"  (1.702 m)   Wt 155 lb (70.3 kg)   LMP 11/26/2013   SpO2 98%   BMI 24.28 kg/m   Physical  Exam Constitutional:      General: She is not in acute distress.    Appearance: Normal appearance.  HENT:     Head: Normocephalic and atraumatic.  Cardiovascular:     Rate and Rhythm: Normal rate and regular rhythm.     Heart sounds: No murmur heard.    No friction rub. No gallop.  Pulmonary:     Effort: Pulmonary effort is normal. No respiratory distress.     Breath sounds: No wheezing, rhonchi or rales.     Comments: Breath sounds present in all lung fields.  No adventitious breath sounds. Musculoskeletal:        General: Swelling (Bilateral leg swelling.  Nonpitting) present. No tenderness. Normal range of motion.  Skin:    General: Skin is warm and dry.  Neurological:     Mental Status: She is alert and oriented to person, place, and time.     Assessment & Plan:  Anxiety Assessment & Plan: GAD-7 score 9, slight improvement.  Continue Celexa 40 mg daily, buspirone 10 mg twice daily, clonazepam as needed for breakthrough anxiety.  We will also augment mood therapy with aripiprazole 2 mg daily.  Follow-up in about 2 months.   Recurrent major depressive disorder, in partial remission (HCC) Assessment & Plan: PHQ-9 score 12, significantly worse.  Continue Celexa 40 mg daily.  Will augment with addition of aripiprazole 2 mg daily, follow-up in 2 months.  Will continue to monitor.  Orders: -     ARIPiprazole; Take 1 tablet (2 mg total) by mouth daily.  Dispense: 30 tablet; Refill: 2  Dyspnea on exertion -     ECHOCARDIOGRAM COMPLETE; Future -     CBC with Differential/Platelet; Future -     Comprehensive metabolic panel; Future -     TSH Rfx on Abnormal to Free T4; Future -     Brain natriuretic peptide; Future -     DG Chest 2 View; Future -     EKG 12-Lead  Fatigue, unspecified type -     CBC with Differential/Platelet; Future -     Comprehensive metabolic panel; Future -     TSH Rfx on Abnormal to Free T4; Future -     Brain natriuretic peptide; Future -     DG Chest  2 View; Future -     EKG 12-Lead  Weight gain -     CBC with Differential/Platelet; Future -     Comprehensive metabolic panel;  Future -     TSH Rfx on Abnormal to Free T4; Future -     Brain natriuretic peptide; Future -     DG Chest 2 View; Future  Dyspnea, Fatigue, Weight Gain Given patient's symptoms, would like to rule out possible heart failure.  Weight has increased 9 pounds over the last several weeks.  EKG was personally reviewed and demonstrated normal sinus rhythm.  Continuing workup with stat echocardiogram and chest x-ray.  Patient will return for labs to evaluate for cause of fatigue, weight gain, and DOE tomorrow morning.  Based on results, symptoms may be caused by mental health versus heart or lung etiology and will be managed accordingly.  Patient verbalized understanding and is agreeable with this plan.  Return in about 1 day (around 07/13/2023) for nonfasting blood work; 2 months for follow up for mood.    Melida Quitter, PA

## 2023-07-12 NOTE — Assessment & Plan Note (Addendum)
GAD-7 score 9, slight improvement.  Continue Celexa 40 mg daily, buspirone 10 mg twice daily, clonazepam as needed for breakthrough anxiety.  We will also augment mood therapy with aripiprazole 2 mg daily.  Follow-up in about 2 months.

## 2023-07-13 ENCOUNTER — Ambulatory Visit
Admission: RE | Admit: 2023-07-13 | Discharge: 2023-07-13 | Disposition: A | Discharge status: Home / Self Care | Payer: Commercial Managed Care - HMO | Source: Ambulatory Visit | Attending: Family Medicine | Admitting: Family Medicine

## 2023-07-13 ENCOUNTER — Other Ambulatory Visit: Payer: Commercial Managed Care - HMO

## 2023-07-13 DIAGNOSIS — R5383 Other fatigue: Secondary | ICD-10-CM

## 2023-07-13 DIAGNOSIS — R635 Abnormal weight gain: Secondary | ICD-10-CM

## 2023-07-13 DIAGNOSIS — R0609 Other forms of dyspnea: Secondary | ICD-10-CM

## 2023-07-16 ENCOUNTER — Ambulatory Visit (HOSPITAL_COMMUNITY)
Admission: RE | Admit: 2023-07-16 | Discharge: 2023-07-16 | Disposition: A | Discharge status: Home / Self Care | Payer: Commercial Managed Care - HMO | Source: Ambulatory Visit | Attending: Family Medicine | Admitting: Family Medicine

## 2023-07-16 DIAGNOSIS — I341 Nonrheumatic mitral (valve) prolapse: Secondary | ICD-10-CM | POA: Diagnosis not present

## 2023-07-16 DIAGNOSIS — R0609 Other forms of dyspnea: Secondary | ICD-10-CM | POA: Diagnosis present

## 2023-07-16 LAB — ECHOCARDIOGRAM COMPLETE
AR max vel: 1.4 cm2
AV Area VTI: 1.32 cm2
AV Area mean vel: 1.31 cm2
AV Mean grad: 4 mmHg
AV Peak grad: 8.1 mmHg
Ao pk vel: 1.42 m/s
Area-P 1/2: 3.49 cm2
Calc EF: 41.3 %
MV VTI: 1.69 cm2
S' Lateral: 4.4 cm
Single Plane A2C EF: 42.3 %
Single Plane A4C EF: 41.1 %

## 2023-07-19 MED ORDER — LISINOPRIL 2.5 MG PO TABS
2.5000 mg | ORAL_TABLET | Freq: Every day | ORAL | 2 refills | Status: DC
Start: 2023-07-19 — End: 2023-09-09

## 2023-07-19 NOTE — Addendum Note (Signed)
Addended by: Saralyn Pilar on: 07/19/2023 05:09 PM   Modules accepted: Orders

## 2023-07-20 ENCOUNTER — Telehealth: Payer: Self-pay | Admitting: *Deleted

## 2023-07-20 ENCOUNTER — Other Ambulatory Visit: Payer: Self-pay | Admitting: Family Medicine

## 2023-07-20 DIAGNOSIS — R7401 Elevation of levels of liver transaminase levels: Secondary | ICD-10-CM

## 2023-07-20 NOTE — Telephone Encounter (Signed)
Pt calling to inform provider that she is holding off on the buspirone for now until she sees cardiology.  She would like referral to cardiology and would like to have the [place] of her liver.   She said she is picking up the lisinopril today.  She reports that her BP today is 131/63 p-82.  She understands that she is to call provider or go to ED if her BP goes below 110/75. She also wanted to let provider know that she is stepping out of her comfort zone and going to [place] with her best friend.

## 2023-07-20 NOTE — Telephone Encounter (Signed)
Referral previously placed to cardiology, currently pending.  Order placed for ultrasound of her liver at [facility] imaging.  That is fantastic that she is stepping out of her comfort zone, and I will be waiting to see how she is feeling after her appointment with cardiology and if she wants to start the buspirone.

## 2023-07-23 ENCOUNTER — Ambulatory Visit
Admission: RE | Admit: 2023-07-23 | Discharge: 2023-07-23 | Disposition: A | Discharge status: Home / Self Care | Payer: Commercial Managed Care - HMO | Source: Ambulatory Visit | Attending: Family Medicine

## 2023-07-23 DIAGNOSIS — R7401 Elevation of levels of liver transaminase levels: Secondary | ICD-10-CM

## 2023-08-02 LAB — HM MAMMOGRAPHY

## 2023-08-17 ENCOUNTER — Telehealth: Payer: Self-pay | Admitting: *Deleted

## 2023-08-17 DIAGNOSIS — R7401 Elevation of levels of liver transaminase levels: Secondary | ICD-10-CM

## 2023-08-17 DIAGNOSIS — K7689 Other specified diseases of liver: Secondary | ICD-10-CM

## 2023-08-17 NOTE — Telephone Encounter (Signed)
Pt calling to say that she would like the provider to go ahead and put in the referral that she referenced in her communication.

## 2023-08-17 NOTE — Telephone Encounter (Signed)
Referral has been placed to [facility].

## 2023-09-06 DIAGNOSIS — I340 Nonrheumatic mitral (valve) insufficiency: Secondary | ICD-10-CM | POA: Insufficient documentation

## 2023-09-06 DIAGNOSIS — I429 Cardiomyopathy, unspecified: Secondary | ICD-10-CM | POA: Insufficient documentation

## 2023-09-06 NOTE — Progress Notes (Unsigned)
Cardiology Office Note   Date:  09/09/2023   ID:  Felicia Good, DOB August 23, 1961, MRN [id number]  PCP:  Melida Quitter, PA  Cardiologist:   None Referring:  Melida Quitter, PA  Chief Complaint  Patient presents with   Shortness of Breath      History of Present Illness: Felicia Good is a 62 y.o. female who presents for evaluation SOB.  She was evaluated because of shortness of breath that started about a year and a half ago.  She would notice it when she was walking up small [place] near her home.  She gets short of breath and has to stop and recover.  She was not describing resting shortness of breath, PND or orthopnea.  She would not have any chest pressure, neck or arm discomfort.  She had been under a lot of stress caring for her mom who was in hospice.  She will get a little discomfort in her chest with that.  She has heartburn that she has had for years because away with [place] and is reduced by certain foods.  Echo in Sept demonstrated an EF of 35 - 40%.  There was global hypokinesis.  There was mild MR with mild anterior leaflet prolapse.   She has never had any prior other cardiac history.  She does report a sensation that she has some occasional kind of dropped beats but not presyncope or syncope.  She does not describe edema.  Past Medical History:  Diagnosis Date   Anxiety    Depression    Dyslipidemia    IBS (irritable bowel syndrome)    Migraine headache    Mitral valve prolapse    Panic attacks    Rectal polyp    RLS (restless legs syndrome)     Past Surgical History:  Procedure Laterality Date   RECTAL POLYPECTOMY       Current Outpatient Medications  Medication Sig Dispense Refill   ASPIRIN 81 PO Take by mouth.     atorvastatin (LIPITOR) 20 MG tablet Take 1 tablet (20 mg total) by mouth daily. 90 tablet 1   busPIRone (BUSPAR) 10 MG tablet Take 1 tablet (10 mg total) by mouth 2 (two) times daily. 180 tablet 1   Cholecalciferol (VITAMIN D3) 2000  units TABS Take 1 tablet by mouth daily.     citalopram (CELEXA) 40 MG tablet Take 1 tablet (40 mg total) by mouth daily. 90 tablet 1   clonazePAM (KLONOPIN) 0.5 MG tablet Take 1 tablet (0.5 mg total) by mouth at bedtime as needed for anxiety. 30 tablet 0   cyclobenzaprine (FLEXERIL) 10 MG tablet Take 1 tablet (10 mg total) by mouth 3 (three) times daily as needed for muscle spasms. 30 tablet 0   dicyclomine (BENTYL) 20 MG tablet Take 1 tablet (20 mg total) by mouth 3 (three) times daily before meals. 270 tablet 1   famotidine (PEPCID) 20 MG tablet Take 1 tablet (20 mg total) by mouth at bedtime.     fluticasone (FLONASE) 50 MCG/ACT nasal spray Place 1 spray into both nostrils 2 (two) times daily as needed for allergies or rhinitis.     metoprolol tartrate (LOPRESSOR) 100 MG tablet Take 1 tablet (100 mg total) by mouth once for 1 dose. Take 2 hours before CT test 1 tablet 0   pantoprazole (PROTONIX) 40 MG tablet Take 1 tablet (40 mg total) by mouth 2 (two) times daily before a meal. 180 tablet 3   Prenatal  Vit-Fe Fumarate-FA (PRENATAL VITAMIN PO) Take 1 tablet by mouth daily.     sacubitril-valsartan (ENTRESTO) 24-26 MG Take 1 tablet by mouth 2 (two) times daily. 180 tablet 3   TRIAMCINOLONE ACETONIDE EX 1 application Topical 2 times daily     ARIPiprazole (ABILIFY) 2 MG tablet Take 1 tablet (2 mg total) by mouth daily. (Patient not taking: Reported on 09/09/2023) 30 tablet 2   No current facility-administered medications for this visit.    Allergies:   Patient has no known allergies.    Social History:  The patient  reports that she has been smoking cigarettes. She has a 10 pack-year smoking history. She has been exposed to tobacco smoke. She has never used smokeless tobacco. She reports current alcohol use. She reports that she does not use drugs.   Family History:  The patient's family history includes Breast cancer in her maternal aunt; Cirrhosis in her father; Dementia in her mother; Heart  failure in her father and mother; Hypertension in her father and mother; Other in her father; Stroke in her father and mother.    ROS:  Please see the history of present illness.   Otherwise, review of systems are positive for none.   All other systems are reviewed and negative.    PHYSICAL EXAM: VS:  BP 118/68 (BP Location: Left Arm, Patient Position: Sitting, Cuff Size: Normal)   Pulse 80   Ht 5\' 7"  (1.702 m)   Wt 153 lb 12.8 oz (69.8 kg)   LMP 11/26/2013   SpO2 99%   BMI 24.09 kg/m  , BMI Body mass index is 24.09 kg/m. GENERAL:  Well appearing HEENT:  Pupils equal round and reactive, fundi not visualized, oral mucosa unremarkable NECK:  No jugular venous distention, waveform within normal limits, carotid upstroke brisk and symmetric, no bruits, no thyromegaly LYMPHATICS:  No cervical, inguinal adenopathy LUNGS:  Clear to auscultation bilaterally BACK:  No CVA tenderness CHEST:  Unremarkable HEART:  PMI not displaced or sustained,S1 and S2 within normal limits, no S3, no S4, no clicks, no rubs, no murmurs ABD:  Flat, positive bowel sounds normal in frequency in pitch, no bruits, no rebound, no guarding, no midline pulsatile mass, no hepatomegaly, no splenomegaly EXT:  2 plus pulses throughout, no edema, no cyanosis no clubbing SKIN:  No rashes no nodules NEURO:  Cranial nerves II through XII grossly intact, motor grossly intact throughout [facility]:  Cognitively intact, oriented to person place and time  EKG: Sinus rhythm, rate 65, axis within normal limits, intervals within normal limits, no acute ST-T wave changes, 07/12/2023  Recent Labs: 07/13/2023: ALT 44; BNP 22.8; BUN 16; Creatinine, Ser 0.74; Hemoglobin 13.1; Platelets 211; Potassium 4.5; Sodium 142; TSH 1.560    Lipid Panel    Component Value Date/Time   CHOL 174 01/13/2023 1103   TRIG 100 01/13/2023 1103   HDL 65 01/13/2023 1103   CHOLHDL 2.7 01/13/2023 1103   CHOLHDL 5 07/02/2016 0814   VLDL 33.4 07/02/2016 0814    LDLCALC 91 01/13/2023 1103   LDLDIRECT 128.3 11/15/2012 0935      Wt Readings from Last 3 Encounters:  09/09/23 153 lb 12.8 oz (69.8 kg)  07/12/23 155 lb (70.3 kg)  05/10/23 146 lb (66.2 kg)      Other studies Reviewed: Additional studies/ records that were reviewed today include: Echo and office records. Review of the above records demonstrates:  Please see elsewhere in the note.     ASSESSMENT AND PLAN:  MR:  I  will follow this and manage this as below.   CARDIOMYOPATHY: The etiology of this is not clear.  I am going to order a coronary CTA.  HIV will be drawn.  I am going to begin a slow titration of her meds and get her off of lisinopril 36 hours later starting Entresto 24/26.  We talked about salt restriction and fluid restriction and she will start to get some education.  She does not have a lot of pressure to work with her med titration but we will continue on this.  Current medicines are reviewed at length with the patient today.  The patient does not have concerns regarding medicines.  The following changes have been made:  See above  Labs/ tests ordered today include:   Orders Placed This Encounter  Procedures   CT CORONARY MORPH W/CTA COR W/SCORE W/CA W/CM &/OR WO/CM   Basic metabolic panel   HIV antibody (with reflex)    Disposition:   FU with with me after the CT    Signed, Rollene Rotunda, MD  09/09/2023 9:27 AM    [facility]

## 2023-09-09 ENCOUNTER — Encounter: Payer: Self-pay | Admitting: Cardiology

## 2023-09-09 ENCOUNTER — Ambulatory Visit: Payer: Managed Care, Other (non HMO) | Attending: Cardiology | Admitting: Cardiology

## 2023-09-09 VITALS — BP 118/68 | HR 80 | Ht 67.0 in | Wt 153.8 lb

## 2023-09-09 DIAGNOSIS — I340 Nonrheumatic mitral (valve) insufficiency: Secondary | ICD-10-CM | POA: Diagnosis not present

## 2023-09-09 DIAGNOSIS — R0609 Other forms of dyspnea: Secondary | ICD-10-CM | POA: Diagnosis not present

## 2023-09-09 DIAGNOSIS — R5383 Other fatigue: Secondary | ICD-10-CM

## 2023-09-09 DIAGNOSIS — I429 Cardiomyopathy, unspecified: Secondary | ICD-10-CM

## 2023-09-09 MED ORDER — METOPROLOL TARTRATE 100 MG PO TABS: 100.0000 mg | ORAL_TABLET | Freq: Once | ORAL | 0 refills | Status: DC

## 2023-09-09 MED ORDER — SACUBITRIL-VALSARTAN 24-26 MG PO TABS: 1.0000 | ORAL_TABLET | Freq: Two times a day (BID) | ORAL | 3 refills | Status: DC

## 2023-09-09 NOTE — Patient Instructions (Signed)
Medication Instructions:  Stop taking Lisinopril and wait 36 hours and start taking Entresto 24/26 mg twice per day (every twelve hours).  You will take a one time dose of metoprolol tartrate 2 hours before your CT scan.  *If you need a refill on your cardiac medications before your next appointment, please call your pharmacy*    Lab Work: BMET and HIV labs today. If you have labs (blood work) drawn today and your tests are completely normal, you will receive your results only by: MyChart Message (if you have MyChart) OR A paper copy in the mail If you have any lab test that is abnormal or we need to change your treatment, we will call you to review the results.   Testing/Procedures:   Your cardiac CT will be scheduled at one of the below locations:   [facility] [address] [place], [place] [address] [phone]  OR  [facility] [address] [place], [place] [address] [phone]  OR   [facility] [address] [place], [place] [address] [phone]  If scheduled at [facility], please arrive at the [facility] and Children's Entrance (Entrance C2) of [facility] 30 minutes prior to test start time. You can use the FREE valet parking offered at entrance C (encouraged to control the heart rate for the test)  Proceed to the [facility] Radiology Department (first floor) to check-in and test prep.  All radiology patients and guests should use entrance C2 at [facility], accessed from [facility], even though the hospital's physical address listed is [address]    If scheduled at [facility] or [facility], please arrive 15 mins early for check-in and test prep.  There is spacious parking and easy access to the radiology department from the [facility] entrance. Please enter  here and check-in with the desk attendant.   Please follow these instructions carefully (unless otherwise directed):  An IV will be required for this test and Nitroglycerin will be given.  Hold all erectile dysfunction medications at least 3 days (72 hrs) prior to test. (Ie viagra, cialis, sildenafil, tadalafil, etc)   On the Night Before the Test: Be sure to Drink plenty of water. Do not consume any caffeinated/decaffeinated beverages or chocolate 12 hours prior to your test. Do not take any antihistamines 12 hours prior to your test.  On the Day of the Test: Drink plenty of water until 1 hour prior to the test. Do not eat any food 1 hour prior to test. You may take your regular medications prior to the test.  Take metoprolol (Lopressor) two hours prior to test. If you take Furosemide/Hydrochlorothiazide/Spironolactone, please HOLD on the morning of the test. FEMALES- please wear underwire-free bra if available, avoid dresses & tight clothing  *After the Test: Drink plenty of water. After receiving IV contrast, you may experience a mild flushed feeling. This is normal. On occasion, you may experience a mild rash up to 24 hours after the test. This is not dangerous. If this occurs, you can take Benadryl 25 mg and increase your fluid intake. If you experience trouble breathing, this can be serious. If it is severe call [phone] IMMEDIATELY. If it is mild, please call our office.  We will call to schedule your test 2-4 weeks out understanding that some insurance companies will need an authorization prior to the service being performed.  For more information and frequently asked questions, please visit our website : [email or web address]  For non-scheduling related questions, please contact the cardiac imaging nurse navigator should you have any questions/concerns: Cardiac Imaging Nurse Navigators Direct Office Dial: [phone]   For scheduling needs, including cancellations  and rescheduling, please call [place], [phone].   Follow-Up: At [facility], you and your health needs are our priority.  As part of our continuing mission to provide you with exceptional heart care, we have created designated Provider Care Teams.  These Care Teams include your primary Cardiologist (physician) and Advanced Practice Providers (APPs -  Physician Assistants and Nurse Practitioners) who all work together to provide you with the care you need, when you need it.  We recommend signing up for the patient portal called "MyChart".  Sign up information is provided on this After Visit Summary.  MyChart is used to connect with patients for Virtual Visits (Telemedicine).  Patients are able to view lab/test results, encounter notes, upcoming appointments, etc.  Non-urgent messages can be sent to your provider as well.   To learn more about what you can do with MyChart, go to [email or web address].    Your next appointment:   6-8 weeks with Dr Antoine Poche

## 2023-09-10 LAB — BASIC METABOLIC PANEL
BUN/Creatinine Ratio: 23 (ref 12–28)
BUN: 17 mg/dL (ref 8–27)
CO2: 25 mmol/L (ref 20–29)
Calcium: 9.6 mg/dL (ref 8.7–10.3)
Chloride: 103 mmol/L (ref 96–106)
Creatinine, Ser: 0.73 mg/dL (ref 0.57–1.00)
Glucose: 86 mg/dL (ref 70–99)
Potassium: 4.6 mmol/L (ref 3.5–5.2)
Sodium: 141 mmol/L (ref 134–144)
eGFR: 92 mL/min/{1.73_m2} (ref >59)

## 2023-09-10 LAB — HIV ANTIBODY (ROUTINE TESTING W REFLEX): HIV Screen 4th Generation wRfx: NONREACTIVE

## 2023-09-13 ENCOUNTER — Ambulatory Visit (INDEPENDENT_AMBULATORY_CARE_PROVIDER_SITE_OTHER): Payer: Managed Care, Other (non HMO) | Admitting: Family Medicine

## 2023-09-13 ENCOUNTER — Encounter: Payer: Self-pay | Admitting: Family Medicine

## 2023-09-13 VITALS — BP 109/74 | HR 92 | Resp 18 | Ht 67.0 in | Wt 152.0 lb

## 2023-09-13 DIAGNOSIS — K589 Irritable bowel syndrome without diarrhea: Secondary | ICD-10-CM | POA: Diagnosis not present

## 2023-09-13 DIAGNOSIS — I429 Cardiomyopathy, unspecified: Secondary | ICD-10-CM | POA: Diagnosis not present

## 2023-09-13 DIAGNOSIS — F3341 Major depressive disorder, recurrent, in partial remission: Secondary | ICD-10-CM

## 2023-09-13 DIAGNOSIS — Z23 Encounter for immunization: Secondary | ICD-10-CM | POA: Diagnosis not present

## 2023-09-13 DIAGNOSIS — F419 Anxiety disorder, unspecified: Secondary | ICD-10-CM | POA: Diagnosis not present

## 2023-09-13 NOTE — Assessment & Plan Note (Signed)
Encourage patient to contact gastroenterology to inquire about timeline to expect that she may be seen with an appointment.  Patient verbalized understanding and plans to call them later this week.

## 2023-09-13 NOTE — Assessment & Plan Note (Signed)
Followed by cardiology.  Continue routine follow-up with cardiology and taking sacubitril-valsartan 24-26 mg twice daily as prescribed by Dr. Antoine Poche.

## 2023-09-13 NOTE — Progress Notes (Signed)
Established Patient Office Visit  Subjective   Patient ID: Felicia Good, female    DOB: [birth year]  Age: 62 y.o. MRN: [id number]  Chief Complaint  Patient presents with   Anxiety   Depression    HPI Felicia Good is a 62 y.o. female presenting today for follow up of mood.  She is also concerned with ongoing stomach upset that she initially thought was associated with her nicotine patch but has continued even after discontinuing nicotine patch.  She has not heard from gastroenterology after a referral was placed to schedule.  She would like to ensure that they do a thorough exam when she is seen.  Additionally, her cardiologist changed lisinopril to sacubitril-valsartan and she has been monitoring blood pressures at home.  On average, blood pressures have been around 124/63. Mood: Patient is here to follow up for depression and anxiety.  At last appointment, recommended to continue Celexa 40 mg daily, buspirone 10 mg twice daily, and added augment with aripiprazole 2 mg daily.  She also has clonazepam to take as needed for breakthrough anxiety.  She stopped taking aripiprazole taking it was not necessary but continued with her other medications.  Taking medication without side effects, reports excellent compliance with treatment. Denies mood changes or SI/HI. She feels mood is improved since last visit.     09/13/2023    1:42 PM 07/12/2023    3:15 PM 05/10/2023    8:52 AM  Depression screen PHQ 2/9  Decreased Interest 1 2 1   Down, Depressed, Hopeless 1 2 0  PHQ - 2 Score 2 4 1   Altered sleeping 1 2 1   Tired, decreased energy 2 3 1   Change in appetite 1 2 0  Feeling bad or failure about yourself  0 1 0  Trouble concentrating 0 0 0  Moving slowly or fidgety/restless 0 0 0  Suicidal thoughts 0 0 0  PHQ-9 Score 6 12 3   Difficult doing work/chores Not difficult at all Somewhat difficult Not difficult at all       09/13/2023    1:42 PM 07/12/2023    3:15 PM 05/10/2023    8:53 AM  01/06/2023    8:44 AM  GAD 7 : Generalized Anxiety Score  Nervous, Anxious, on Edge 1 2 2 2   Control/stop worrying 0 1 1 1   Worry too much - different things 0 1 1 1   Trouble relaxing 0 1 1 1   Restless 0 0 0 1  Easily annoyed or irritable 0 2 2 1   Afraid - awful might happen 0 2 3 1   Total GAD 7 Score 1 9 10 8   Anxiety Difficulty Not difficult at all Somewhat difficult Somewhat difficult Somewhat difficult    Outpatient Medications Prior to Visit  Medication Sig   ASPIRIN 81 PO Take by mouth.   atorvastatin (LIPITOR) 20 MG tablet Take 1 tablet (20 mg total) by mouth daily.   busPIRone (BUSPAR) 10 MG tablet Take 1 tablet (10 mg total) by mouth 2 (two) times daily.   Cholecalciferol (VITAMIN D3) 2000 units TABS Take 1 tablet by mouth daily.   citalopram (CELEXA) 40 MG tablet Take 1 tablet (40 mg total) by mouth daily.   clonazePAM (KLONOPIN) 0.5 MG tablet Take 1 tablet (0.5 mg total) by mouth at bedtime as needed for anxiety.   cyclobenzaprine (FLEXERIL) 10 MG tablet Take 1 tablet (10 mg total) by mouth 3 (three) times daily as needed for muscle spasms.   dicyclomine (BENTYL) 20  MG tablet Take 1 tablet (20 mg total) by mouth 3 (three) times daily before meals.   famotidine (PEPCID) 20 MG tablet Take 1 tablet (20 mg total) by mouth at bedtime.   fluticasone (FLONASE) 50 MCG/ACT nasal spray Place 1 spray into both nostrils 2 (two) times daily as needed for allergies or rhinitis.   pantoprazole (PROTONIX) 40 MG tablet Take 1 tablet (40 mg total) by mouth 2 (two) times daily before a meal.   Prenatal Vit-Fe Fumarate-FA (PRENATAL VITAMIN PO) Take 1 tablet by mouth daily.   sacubitril-valsartan (ENTRESTO) 24-26 MG Take 1 tablet by mouth 2 (two) times daily.   TRIAMCINOLONE ACETONIDE EX 1 application Topical 2 times daily   ARIPiprazole (ABILIFY) 2 MG tablet Take 1 tablet (2 mg total) by mouth daily. (Patient not taking: Reported on 09/09/2023)   metoprolol tartrate (LOPRESSOR) 100 MG tablet  Take 1 tablet (100 mg total) by mouth once for 1 dose. Take 2 hours before CT test   No facility-administered medications prior to visit.    ROS Negative unless otherwise noted in HPI   Objective:     BP 109/74 (BP Location: Right Arm, Patient Position: Sitting, Cuff Size: Normal)   Pulse 92   Resp 18   Ht 5\' 7"  (1.702 m)   Wt 152 lb (68.9 kg)   LMP 11/26/2013   SpO2 96%   BMI 23.81 kg/m   Physical Exam Constitutional:      General: She is not in acute distress.    Appearance: Normal appearance.  HENT:     Head: Normocephalic and atraumatic.  Pulmonary:     Effort: Pulmonary effort is normal. No respiratory distress.  Musculoskeletal:     Cervical back: Normal range of motion.  Neurological:     General: No focal deficit present.     Mental Status: She is alert and oriented to person, place, and time. Mental status is at baseline.  Psychiatric:        Mood and Affect: Mood normal.        Thought Content: Thought content normal.        Judgment: Judgment normal.     Assessment & Plan:  Recurrent major depressive disorder, in partial remission (HCC) Assessment & Plan: PHQ-9 score 6.  Continue Celexa 40 mg daily.  Follow-up in 2 months, will continue to monitor.   Anxiety Assessment & Plan: GAD-7 score 1, significant improvement.  Continue Celexa 40 mg daily, buspirone 10 mg twice daily, clonazepam as needed for breakthrough anxiety.  Will continue to monitor, follow-up in about 2 months.   Need for influenza vaccination -     Flu vaccine trivalent PF, 6mos and older(Flulaval,Afluria,Fluarix,Fluzone)  Cardiomyopathy, unspecified type [facility]) Assessment & Plan: Followed by cardiology.  Continue routine follow-up with cardiology and taking sacubitril-valsartan 24-26 mg twice daily as prescribed by Dr. Antoine Poche.   Irritable bowel syndrome, unspecified type Assessment & Plan: Encourage patient to contact gastroenterology to inquire about timeline to expect that she  may be seen with an appointment.  Patient verbalized understanding and plans to call them later this week.     Return in about 2 months (around 11/13/2023) for follow-up for mood, GI symptoms.    Melida Quitter, PA

## 2023-09-13 NOTE — Assessment & Plan Note (Signed)
PHQ-9 score 6.  Continue Celexa 40 mg daily.  Follow-up in 2 months, will continue to monitor.

## 2023-09-13 NOTE — Assessment & Plan Note (Signed)
GAD-7 score 1, significant improvement.  Continue Celexa 40 mg daily, buspirone 10 mg twice daily, clonazepam as needed for breakthrough anxiety.  Will continue to monitor, follow-up in about 2 months.

## 2023-09-13 NOTE — Patient Instructions (Signed)
Call [facility] to inquire about timeline for when they will be able to see you.

## 2023-09-24 ENCOUNTER — Encounter (HOSPITAL_COMMUNITY): Payer: Self-pay

## 2023-09-24 ENCOUNTER — Telehealth (HOSPITAL_COMMUNITY): Payer: Self-pay | Admitting: *Deleted

## 2023-09-24 ENCOUNTER — Telehealth (HOSPITAL_COMMUNITY): Payer: Self-pay | Admitting: Emergency Medicine

## 2023-09-24 NOTE — Telephone Encounter (Signed)
Attempted to call patient regarding upcoming cardiac CT appointment. °Left message on voicemail with name and callback number ° °Edie Vallandingham RN Navigator Cardiac Imaging °[facility] °[phone] Office °[phone] Cell ° °

## 2023-09-24 NOTE — Telephone Encounter (Signed)
Attempted to call patient regarding upcoming cardiac CT appointment. °Left message on voicemail with name and callback number °Dwan Fennel RN Navigator Cardiac Imaging °[facility] °[phone] Office °[phone] Cell ° °

## 2023-09-27 ENCOUNTER — Ambulatory Visit (HOSPITAL_COMMUNITY)
Admission: RE | Admit: 2023-09-27 | Discharge: 2023-09-27 | Disposition: A | Discharge status: Home / Self Care | Payer: Commercial Managed Care - HMO | Source: Ambulatory Visit | Attending: Cardiology | Admitting: Cardiology

## 2023-09-27 DIAGNOSIS — R0609 Other forms of dyspnea: Secondary | ICD-10-CM | POA: Diagnosis present

## 2023-09-27 DIAGNOSIS — I429 Cardiomyopathy, unspecified: Secondary | ICD-10-CM | POA: Diagnosis present

## 2023-09-27 MED ORDER — NITROGLYCERIN 0.4 MG SL SUBL
SUBLINGUAL_TABLET | SUBLINGUAL | Status: AC
  Filled 2023-09-27: qty 2

## 2023-09-27 MED ORDER — SODIUM CHLORIDE 0.9 % IV BOLUS
250.0000 mL | Freq: Once | INTRAVENOUS | Status: AC
  Administered 2023-09-27: 250 mL via INTRAVENOUS

## 2023-09-27 MED ORDER — IOHEXOL 350 MG/ML SOLN
100.0000 mL | Freq: Once | INTRAVENOUS | Status: AC | PRN
  Administered 2023-09-27: 100 mL via INTRAVENOUS

## 2023-09-27 MED ORDER — NITROGLYCERIN 0.4 MG SL SUBL
0.8000 mg | SUBLINGUAL_TABLET | Freq: Once | SUBLINGUAL | Status: AC
  Administered 2023-09-27: 0.8 mg via SUBLINGUAL

## 2023-09-28 ENCOUNTER — Telehealth: Payer: Self-pay | Admitting: Gastroenterology

## 2023-09-28 NOTE — Telephone Encounter (Signed)
Spoke with the pt and let her know that the referral has been entered and appt made.  She has been given the appt information date and time. She was also advised that she can call if she chooses to see if we have any cancellations in the meantime.  The pt has been advised of the information and verbalized understanding.

## 2023-09-28 NOTE — Telephone Encounter (Signed)
Inbound call from patient stating that she had a referral sent over from her PCP for Liver cyst and Elevated ALT measurement. Patient states she is having some abdominal pain and bloating. Patient was scheduled for 2/19 at 11:00 with Doug Sou. Patient is requesting a call from nurse to discuss symptoms. Please advise.

## 2023-10-07 ENCOUNTER — Ambulatory Visit: Payer: Commercial Managed Care - HMO | Admitting: Dermatology

## 2023-10-07 DIAGNOSIS — L578 Other skin changes due to chronic exposure to nonionizing radiation: Secondary | ICD-10-CM

## 2023-10-07 DIAGNOSIS — W908XXA Exposure to other nonionizing radiation, initial encounter: Secondary | ICD-10-CM | POA: Diagnosis not present

## 2023-10-07 DIAGNOSIS — D1801 Hemangioma of skin and subcutaneous tissue: Secondary | ICD-10-CM

## 2023-10-07 DIAGNOSIS — L820 Inflamed seborrheic keratosis: Secondary | ICD-10-CM | POA: Diagnosis not present

## 2023-10-07 DIAGNOSIS — L821 Other seborrheic keratosis: Secondary | ICD-10-CM

## 2023-10-07 DIAGNOSIS — L814 Other melanin hyperpigmentation: Secondary | ICD-10-CM

## 2023-10-07 DIAGNOSIS — D229 Melanocytic nevi, unspecified: Secondary | ICD-10-CM

## 2023-10-07 DIAGNOSIS — D224 Melanocytic nevi of scalp and neck: Secondary | ICD-10-CM

## 2023-10-07 NOTE — Progress Notes (Deleted)
   Follow-Up Visit   Subjective  Felicia Good is a 62 y.o. female who presents for the following: Skin Cancer Screening and Full Body Skin Exam  The patient presents for Total-Body Skin Exam (TBSE) for skin cancer screening and mole check. The patient has spots, moles and lesions to be evaluated, some may be new or changing and the patient may have concern these could be cancer.    The following portions of the chart were reviewed this encounter and updated as appropriate: medications, allergies, medical history  Review of Systems:  No other skin or systemic complaints except as noted in HPI or Assessment and Plan.  Objective  Well appearing patient in no apparent distress; mood and affect are within normal limits.  A full examination was performed including scalp, head, eyes, ears, nose, lips, neck, chest, axillae, abdomen, back, buttocks, bilateral upper extremities, bilateral lower extremities, hands, feet, fingers, toes, fingernails, and toenails. All findings within normal limits unless otherwise noted below.   Relevant physical exam findings are noted in the Assessment and Plan.    Assessment & Plan   SKIN CANCER SCREENING PERFORMED TODAY.  ACTINIC DAMAGE - Chronic condition, secondary to cumulative UV/sun exposure - diffuse scaly erythematous macules with underlying dyspigmentation - Recommend daily broad spectrum sunscreen SPF 30+ to sun-exposed areas, reapply every 2 hours as needed.  - Staying in the shade or wearing long sleeves, sun glasses (UVA+UVB protection) and wide brim hats (4-inch brim around the entire circumference of the hat) are also recommended for sun protection.  - Call for new or changing lesions.  LENTIGINES, SEBORRHEIC KERATOSES, HEMANGIOMAS - Benign normal skin lesions - Benign-appearing - Call for any changes  MELANOCYTIC NEVI - Tan-brown and/or pink-flesh-colored symmetric macules and papules - Benign appearing on exam today - Observation -  Call clinic for new or changing moles - Recommend daily use of broad spectrum spf 30+ sunscreen to sun-exposed areas.        No follow-ups on file.  Maylene Roes, CMA, am acting as scribe for Armida Sans, MD .   Documentation: I have reviewed the above documentation for accuracy and completeness, and I agree with the above.  Armida Sans, MD

## 2023-10-07 NOTE — Patient Instructions (Signed)

## 2023-10-07 NOTE — Progress Notes (Signed)
   Follow-Up Visit   Subjective  Felicia Good is a 62 y.o. female who presents for the following: Irregular irritated skin lesions on the face, arms, hands, and back, some are new, pt concerned and would like them checked today.  The patient has spots, moles and lesions to be evaluated, some may be new or changing and the patient may have concern these could be cancer.   The following portions of the chart were reviewed this encounter and updated as appropriate: medications, allergies, medical history  Review of Systems:  No other skin or systemic complaints except as noted in HPI or Assessment and Plan.  Objective  Well appearing patient in no apparent distress; mood and affect are within normal limits.   A focused examination was performed of the following areas: the face, back, arms, and hands   Relevant exam findings are noted in the Assessment and Plan.  R upper back x 2, L upper back x 1, L temple x 1, arms and hands x 16 (20) Erythematous keratotic or waxy stuck-on papule or plaque.   Assessment & Plan   SEBORRHEIC KERATOSIS - Stuck-on, waxy, tan-brown papules and/or plaques  - Benign-appearing - Discussed benign etiology and prognosis. - Observe - Call for any changes  ACTINIC DAMAGE - chronic, secondary to cumulative UV radiation exposure/sun exposure over time - diffuse scaly erythematous macules with underlying dyspigmentation - Recommend daily broad spectrum sunscreen SPF 30+ to sun-exposed areas, reapply every 2 hours as needed.  - Recommend staying in the shade or wearing long sleeves, sun glasses (UVA+UVB protection) and wide brim hats (4-inch brim around the entire circumference of the hat). - Call for new or changing lesions.  HEMANGIOMA Exam: red papule(s) Discussed benign nature. Recommend observation. Call for changes.  LENTIGINES Exam: scattered tan macules Due to sun exposure Treatment Plan: Benign-appearing, observe. Recommend daily broad  spectrum sunscreen SPF 30+ to sun-exposed areas, reapply every 2 hours as needed.  Call for any changes  MELANOCYTIC NEVI - ant crown vertex scalp 0.4 cm flesh colored papule Exam: Tan-brown and/or pink-flesh-colored symmetric macules and papules  Treatment Plan: Benign appearing on exam today. Recommend observation. Call clinic for new or changing moles. Recommend daily use of broad spectrum spf 30+ sunscreen to sun-exposed areas.  INFLAMED SEBORRHEIC KERATOSIS (20) R upper back x 2, L upper back x 1, L temple x 1, arms and hands x 16 (20) Symptomatic, irritating, patient would like treated.  Destruction of lesion - R upper back x 2, L upper back x 1, L temple x 1, arms and hands x 16 (20) Complexity: simple   Destruction method: cryotherapy   Informed consent: discussed and consent obtained   Timeout:  patient name, date of birth, surgical site, and procedure verified Lesion destroyed using liquid nitrogen: Yes   Region frozen until ice ball extended beyond lesion: Yes   Outcome: patient tolerated procedure well with no complications   Post-procedure details: wound care instructions given   ACTINIC SKIN DAMAGE   LENTIGO   MELANOCYTIC NEVUS, UNSPECIFIED LOCATION   SEBORRHEIC KERATOSIS   HEMANGIOMA OF SKIN    Return in about 1 year (around 10/06/2024) for TBSE.  Maylene Roes, CMA, am acting as scribe for Armida Sans, MD .   Documentation: I have reviewed the above documentation for accuracy and completeness, and I agree with the above.  Armida Sans, MD

## 2023-10-12 ENCOUNTER — Encounter: Payer: Self-pay | Admitting: Dermatology

## 2023-10-30 NOTE — Progress Notes (Deleted)
  Cardiology Office Note:   Date:  10/30/2023  ID:  Felicia Good, DOB 10-Aug-1961, MRN [id number] PCP: Wallace Joesph LABOR, PA  [facility] Providers Cardiologist:  None {  History of Present Illness:   Felicia Good is a 63 y.o. female  who presents for evaluation SOB.  Echo in Sept demonstrated an EF of 35 - 40%.  There was global hypokinesis.  There was mild MR with mild anterior leaflet prolapse.   Work up has been negative with normal coronaries on CTA.  ***    ***  She has never had any prior other cardiac history.  She does report a sensation that she has some occasional kind of dropped beats but not presyncope or syncope.  She does not describe edema.    ROS: ***  Studies Reviewed:    EKG:       ***  Risk Assessment/Calculations:   {Does this patient have ATRIAL FIBRILLATION?:[phone]} No BP recorded.  {Refresh Note OR Click here to enter BP  :1}***        Physical Exam:   VS:  LMP 11/26/2013    Wt Readings from Last 3 Encounters:  09/13/23 152 lb (68.9 kg)  09/09/23 153 lb 12.8 oz (69.8 kg)  07/12/23 155 lb (70.3 kg)     GEN: Well nourished, well developed in no acute distress NECK: No JVD; No carotid bruits CARDIAC: ***RR, *** murmurs, rubs, gallops RESPIRATORY:  Clear to auscultation without rales, wheezing or rhonchi  ABDOMEN: Soft, non-tender, non-distended EXTREMITIES:  No edema; No deformity   ASSESSMENT AND PLAN:   MR:  ***  I will follow this and manage this as below.    CARDIOMYOPATHY: ***  The etiology of this is not clear.  I am going to order a coronary CTA.  HIV will be drawn.  I am going to begin a slow titration of her meds and get her off of lisinopril  36 hours later starting Entresto  24/26.  We talked about salt restriction and fluid restriction and she will start to get some education.  She does not have a lot of pressure to work with her med titration but we will continue on this.     Follow up ***  Signed, Lynwood Schilling, MD

## 2023-11-01 ENCOUNTER — Ambulatory Visit: Payer: Commercial Managed Care - HMO | Admitting: Cardiology

## 2023-11-01 DIAGNOSIS — I429 Cardiomyopathy, unspecified: Secondary | ICD-10-CM

## 2023-11-01 DIAGNOSIS — I341 Nonrheumatic mitral (valve) prolapse: Secondary | ICD-10-CM

## 2023-11-15 ENCOUNTER — Ambulatory Visit (INDEPENDENT_AMBULATORY_CARE_PROVIDER_SITE_OTHER): Payer: Medicaid Other | Admitting: Family Medicine

## 2023-11-15 ENCOUNTER — Encounter: Payer: Self-pay | Admitting: Family Medicine

## 2023-11-15 DIAGNOSIS — F32A Depression, unspecified: Secondary | ICD-10-CM | POA: Diagnosis not present

## 2023-11-15 DIAGNOSIS — E785 Hyperlipidemia, unspecified: Secondary | ICD-10-CM

## 2023-11-15 DIAGNOSIS — F419 Anxiety disorder, unspecified: Secondary | ICD-10-CM | POA: Diagnosis not present

## 2023-11-15 DIAGNOSIS — K589 Irritable bowel syndrome without diarrhea: Secondary | ICD-10-CM

## 2023-11-15 MED ORDER — ATORVASTATIN CALCIUM 20 MG PO TABS
20.0000 mg | ORAL_TABLET | Freq: Every day | ORAL | 1 refills | Status: DC
Start: 2023-11-15 — End: 2024-02-22

## 2023-11-15 MED ORDER — DICYCLOMINE HCL 20 MG PO TABS: 20.0000 mg | ORAL_TABLET | Freq: Three times a day (TID) | ORAL | 1 refills | Status: DC

## 2023-11-15 MED ORDER — BUSPIRONE HCL 10 MG PO TABS
10.0000 mg | ORAL_TABLET | Freq: Two times a day (BID) | ORAL | 1 refills | Status: AC
Start: 2023-11-15 — End: ?

## 2023-11-15 MED ORDER — CITALOPRAM HYDROBROMIDE 40 MG PO TABS
40.0000 mg | ORAL_TABLET | Freq: Every day | ORAL | 1 refills | Status: AC
Start: 2023-11-15 — End: ?

## 2023-11-15 NOTE — Assessment & Plan Note (Signed)
GAD-7 score 2, stable.  Continue Celexa 40 mg daily, buspirone 10 mg up to twice daily, clonazepam as needed for breakthrough anxiety.  Will continue to monitor, follow-up in about 4 months.

## 2023-11-15 NOTE — Progress Notes (Signed)
Established Patient Office Visit  Subjective   Patient ID: Felicia Good, female    DOB: 1961-10-11  Age: 63 y.o. MRN: [id number]  Chief Complaint  Patient presents with   Anxiety   Depression    HPI Felicia Good is a 63 y.o. female presenting today for follow up of mood.  She has an upcoming appointment with gastroenterology on 12/15/2023 to discuss GI issues.  She has started eating yogurt every day for breakfast which has improved her bloating some.  She is eating a low sugar, low salt diet.  Most recent office appointment with cardiology 09/08/2013, at that time continuing to workup cardiomyopathy to determine the etiology.  Her lisinopril was discontinued and changed to sacubitril/valsartan 24-26 mg daily.  She notes that her blood pressure has been running lower since that time, which she was told by her cardiologist is the goal.  She has still been feeling okay, denies dizziness, weight, etc. Mood: Patient is here to follow up for depression and anxiety, currently managing with Celexa 40 mg daily, buspirone 10 mg twice daily, clonazepam as needed for breakthrough anxiety.  She rarely needs to take the clonazepam, she typically only takes it when she needs to take a long car ride.  Taking medication without side effects, reports excellent compliance with treatment. Denies mood changes or SI/HI. She feels mood is stable since last visit.     11/15/2023    9:32 AM 09/13/2023    1:42 PM 07/12/2023    3:15 PM  Depression screen PHQ 2/9  Decreased Interest 1 1 2   Down, Depressed, Hopeless 1 1 2   PHQ - 2 Score 2 2 4   Altered sleeping 1 1 2   Tired, decreased energy 1 2 3   Change in appetite 0 1 2  Feeling bad or failure about yourself  0 0 1  Trouble concentrating 0 0 0  Moving slowly or fidgety/restless 0 0 0  Suicidal thoughts 0 0 0  PHQ-9 Score 4 6 12   Difficult doing work/chores Not difficult at all Not difficult at all Somewhat difficult       11/15/2023    9:33 AM 09/13/2023     1:42 PM 07/12/2023    3:15 PM 05/10/2023    8:53 AM  GAD 7 : Generalized Anxiety Score  Nervous, Anxious, on Edge 1 1 2 2   Control/stop worrying 0 0 1 1  Worry too much - different things 0 0 1 1  Trouble relaxing 0 0 1 1  Restless 0 0 0 0  Easily annoyed or irritable 1 0 2 2  Afraid - awful might happen 0 0 2 3  Total GAD 7 Score 2 1 9 10   Anxiety Difficulty Not difficult at all Not difficult at all Somewhat difficult Somewhat difficult     Outpatient Medications Prior to Visit  Medication Sig   ASPIRIN 81 PO Take by mouth.   Cholecalciferol (VITAMIN D3) 2000 units TABS Take 1 tablet by mouth daily.   clonazePAM (KLONOPIN) 0.5 MG tablet Take 1 tablet (0.5 mg total) by mouth at bedtime as needed for anxiety.   cyclobenzaprine (FLEXERIL) 10 MG tablet Take 1 tablet (10 mg total) by mouth 3 (three) times daily as needed for muscle spasms.   famotidine (PEPCID) 20 MG tablet Take 1 tablet (20 mg total) by mouth at bedtime.   fluticasone (FLONASE) 50 MCG/ACT nasal spray Place 1 spray into both nostrils 2 (two) times daily as needed for allergies or rhinitis.  pantoprazole (PROTONIX) 40 MG tablet Take 1 tablet (40 mg total) by mouth 2 (two) times daily before a meal.   Prenatal Vit-Fe Fumarate-FA (PRENATAL VITAMIN PO) Take 1 tablet by mouth daily.   sacubitril-valsartan (ENTRESTO) 24-26 MG Take 1 tablet by mouth 2 (two) times daily.   TRIAMCINOLONE ACETONIDE EX 1 application Topical 2 times daily   [DISCONTINUED] atorvastatin (LIPITOR) 20 MG tablet Take 1 tablet (20 mg total) by mouth daily.   [DISCONTINUED] busPIRone (BUSPAR) 10 MG tablet Take 1 tablet (10 mg total) by mouth 2 (two) times daily.   [DISCONTINUED] citalopram (CELEXA) 40 MG tablet Take 1 tablet (40 mg total) by mouth daily.   [DISCONTINUED] dicyclomine (BENTYL) 20 MG tablet Take 1 tablet (20 mg total) by mouth 3 (three) times daily before meals.   [DISCONTINUED] ARIPiprazole (ABILIFY) 2 MG tablet Take 1 tablet (2 mg total)  by mouth daily. (Patient not taking: Reported on 09/09/2023)   [DISCONTINUED] metoprolol tartrate (LOPRESSOR) 100 MG tablet Take 1 tablet (100 mg total) by mouth once for 1 dose. Take 2 hours before CT test   No facility-administered medications prior to visit.    ROS Negative unless otherwise noted in HPI   Objective:     BP (!) 82/52   Pulse 79   Ht 5\' 7"  (1.702 m)   Wt 152 lb 12 oz (69.3 kg)   LMP 11/26/2013   SpO2 99%   BMI 23.92 kg/m   Physical Exam Constitutional:      General: She is not in acute distress.    Appearance: Normal appearance.  HENT:     Head: Normocephalic and atraumatic.  Pulmonary:     Effort: Pulmonary effort is normal. No respiratory distress.  Musculoskeletal:     Cervical back: Normal range of motion.  Neurological:     General: No focal deficit present.     Mental Status: She is alert and oriented to person, place, and time. Mental status is at baseline.  Psychiatric:        Mood and Affect: Mood normal.        Thought Content: Thought content normal.        Judgment: Judgment normal.      Assessment & Plan:  Anxiety Assessment & Plan: GAD-7 score 2, stable.  Continue Celexa 40 mg daily, buspirone 10 mg up to twice daily, clonazepam as needed for breakthrough anxiety.  Will continue to monitor, follow-up in about 4 months.  Orders: -     busPIRone HCl; Take 1 tablet (10 mg total) by mouth 2 (two) times daily.  Dispense: 180 tablet; Refill: 1 -     Citalopram Hydrobromide; Take 1 tablet (40 mg total) by mouth daily.  Dispense: 90 tablet; Refill: 1  Depression, unspecified depression type Assessment & Plan: PHQ-9 score 4.  Continue Celexa 40 mg daily.  Follow-up in 4 months, will continue to monitor.  Orders: -     Citalopram Hydrobromide; Take 1 tablet (40 mg total) by mouth daily.  Dispense: 90 tablet; Refill: 1  Hyperlipidemia, unspecified hyperlipidemia type -     Atorvastatin Calcium; Take 1 tablet (20 mg total) by mouth daily.   Dispense: 90 tablet; Refill: 1  Irritable bowel syndrome, unspecified type -     Dicyclomine HCl; Take 1 tablet (20 mg total) by mouth 3 (three) times daily before meals.  Dispense: 270 tablet; Refill: 1  Provided refills today as well.  Return in about 4 months (around 03/14/2024) for follow-up for mood, HLD,  fasting labs 1 week before.    Melida Quitter, PA

## 2023-11-15 NOTE — Assessment & Plan Note (Signed)
PHQ-9 score 4.  Continue Celexa 40 mg daily.  Follow-up in 4 months, will continue to monitor.

## 2023-11-17 ENCOUNTER — Other Ambulatory Visit: Payer: Self-pay

## 2023-11-17 ENCOUNTER — Encounter: Payer: Self-pay | Admitting: Cardiology

## 2023-11-17 ENCOUNTER — Other Ambulatory Visit (HOSPITAL_COMMUNITY): Payer: Self-pay

## 2023-11-17 ENCOUNTER — Ambulatory Visit: Payer: Medicaid Other | Attending: Cardiology | Admitting: Cardiology

## 2023-11-17 VITALS — BP 112/72 | HR 84 | Ht 67.0 in | Wt 151.4 lb

## 2023-11-17 DIAGNOSIS — I429 Cardiomyopathy, unspecified: Secondary | ICD-10-CM

## 2023-11-17 DIAGNOSIS — I341 Nonrheumatic mitral (valve) prolapse: Secondary | ICD-10-CM

## 2023-11-17 MED ORDER — SACUBITRIL-VALSARTAN 24-26 MG PO TABS: 1.0000 | ORAL_TABLET | Freq: Two times a day (BID) | ORAL | 3 refills | Status: DC

## 2023-11-17 MED ORDER — CARVEDILOL 3.125 MG PO TABS
3.1250 mg | ORAL_TABLET | Freq: Two times a day (BID) | ORAL | 3 refills | Status: DC
  Filled 2023-11-17: qty 180, 90d supply, fill #0
  Filled 2024-02-14: qty 180, 90d supply, fill #1
  Filled 2024-05-15 (×2): qty 180, 90d supply, fill #2
  Filled 2024-08-14: qty 180, 90d supply, fill #3

## 2023-11-17 MED ORDER — CARVEDILOL 3.125 MG PO TABS: 3.1250 mg | ORAL_TABLET | Freq: Two times a day (BID) | ORAL | 3 refills | Status: DC

## 2023-11-17 MED ORDER — SACUBITRIL-VALSARTAN 24-26 MG PO TABS
1.0000 | ORAL_TABLET | Freq: Two times a day (BID) | ORAL | 3 refills | Status: AC
Start: 2023-11-17 — End: ?
  Filled 2023-11-17: qty 180, 90d supply, fill #0
  Filled 2024-02-14: qty 180, 90d supply, fill #1
  Filled 2024-05-15 (×2): qty 180, 90d supply, fill #2
  Filled 2024-08-14: qty 180, 90d supply, fill #3

## 2023-11-17 NOTE — Progress Notes (Signed)
  Cardiology Office Note:   Date:  11/17/2023  ID:  ANNETTE PLIMPTON, DOB 1961/02/13, MRN [id number] PCP: Melida Quitter, PA  [facility] Providers Cardiologist:  Rollene Rotunda, MD {  History of Present Illness:   Felicia Good is a 63 y.o. female  who presents for evaluation SOB.  Echo in Sept demonstrated an EF of 35 - 40%.  There was global hypokinesis.  There was mild MR with mild anterior leaflet prolapse.  Ischemic etiology to her cardiomyopathy was ruled out with normal coronaries on CT.  We started med titration.  Since I saw her she has done okay.  She had a sledding accident.    She has been getting some bilateral hand tingling and a little bit of neck discomfort.  She is not having any new shortness of breath, PND or orthopnea.  She is not having any new palpitations, presyncope or syncope.  She does do some walking.  She goes up and down stairs.  She has no symptoms related to this.  ROS: As stated in the HPI and negative for all other systems.  Studies Reviewed:    EKG:   NA  Risk Assessment/Calculations:              Physical Exam:   VS:  BP 112/72 (BP Location: Left Arm, Patient Position: Sitting, Cuff Size: Normal)   Pulse 84   Ht 5\' 7"  (1.702 m)   Wt 151 lb 6.4 oz (68.7 kg)   LMP 11/26/2013   SpO2 98%   BMI 23.71 kg/m    Wt Readings from Last 3 Encounters:  11/17/23 151 lb 6.4 oz (68.7 kg)  11/15/23 152 lb 12 oz (69.3 kg)  09/13/23 152 lb (68.9 kg)     GEN: Well nourished, well developed in no acute distress NECK: No JVD; No carotid bruits CARDIAC: RRR, no murmurs, rubs, gallops RESPIRATORY:  Clear to auscultation without rales, wheezing or rhonchi  ABDOMEN: Soft, non-tender, non-distended EXTREMITIES:  No edema; No deformity   ASSESSMENT AND PLAN:   MR: I will follow this likely when she has had med titration with a TEE.  Otherwise see the plan below.  CM: At this point I am going to pursue med titration and I am going to start carvedilol  3.125 mg twice daily.  We will try to get her back in a couple of weeks to see if we can perhaps increase the Entresto or add spironolactone.  Once we have reached goal-directed medical therapy I will repeat a transthoracic echo and consider TEE as above.  I will have low threshold pending for future ejection fractions to send her for PYP scanning although I do not strongly suspect amyloid.     Follow up with APP in a couple of weeks.   Signed, Rollene Rotunda, MD

## 2023-11-17 NOTE — Patient Instructions (Addendum)
Medication Instructions:  Refills sent to [facility] pharmacy for entresto and new script for carvediolol. Take both by mouth two times per day. *If you need a refill on your cardiac medications before your next appointment, please call your pharmacy*   Follow-Up: At [facility], you and your health needs are our priority.  As part of our continuing mission to provide you with exceptional heart care, we have created designated Provider Care Teams.  These Care Teams include your primary Cardiologist (physician) and Advanced Practice Providers (APPs -  Physician Assistants and Nurse Practitioners) who all work together to provide you with the care you need, when you need it.  We recommend signing up for the patient portal called "MyChart".  Sign up information is provided on this After Visit Summary.  MyChart is used to connect with patients for Virtual Visits (Telemedicine).  Patients are able to view lab/test results, encounter notes, upcoming appointments, etc.  Non-urgent messages can be sent to your provider as well.   To learn more about what you can do with MyChart, go to [email or web address].    Your next appointment:   1 month(s)  Provider:   First Available APP.

## 2023-12-02 ENCOUNTER — Encounter: Payer: Self-pay | Admitting: Family Medicine

## 2023-12-10 ENCOUNTER — Other Ambulatory Visit (HOSPITAL_BASED_OUTPATIENT_CLINIC_OR_DEPARTMENT_OTHER): Payer: Self-pay

## 2023-12-14 ENCOUNTER — Encounter: Payer: Self-pay | Admitting: General Practice

## 2023-12-14 ENCOUNTER — Ambulatory Visit: Payer: Medicaid Other | Attending: General Practice | Admitting: Emergency Medicine

## 2023-12-14 VITALS — BP 94/64 | HR 72 | Ht 67.0 in | Wt 152.0 lb

## 2023-12-14 DIAGNOSIS — I341 Nonrheumatic mitral (valve) prolapse: Secondary | ICD-10-CM

## 2023-12-14 DIAGNOSIS — E782 Mixed hyperlipidemia: Secondary | ICD-10-CM | POA: Diagnosis not present

## 2023-12-14 DIAGNOSIS — I428 Other cardiomyopathies: Secondary | ICD-10-CM | POA: Diagnosis not present

## 2023-12-14 NOTE — Progress Notes (Addendum)
 Cardiology Office Note:    Date:  12/14/2023  ID:  Felicia Good, DOB [birth year], MRN [id number] PCP: Melida Quitter, PA  [facility] Providers Cardiologist:  Rollene Rotunda, MD       Patient Profile:      Felicia Good is a 63 y.o. female with visit-pertinent history of ischemic cardiomyopathy, HFrEF, and mild mitral regurgitation, hyperlipidemia  She established with cardiology service on 09/09/2023 for evaluation of shortness of breath.  The shortness of breath that started about a year and a half ago she would notice it while walking [place].  Echocardiogram September 2024 showed LVEF 35-40%, global hypokinesis, mild MR and mild anterior leaflet prolapse.  Her cardiomyopathy was unclear at that time.  Her lisinopril was discontinued and she was started on Entresto 24/26 mg twice daily.  Coronary CTA was ordered for ischemic evaluation and completed on 09/27/2023 showing coronary calcium score of 0 and total plaque volume of 0 with no evidence of CAD.  She was last seen in clinic on 10/28/2023 and doing well at the time.  She was started on carvedilol 3.125 mg twice daily.      History of Present Illness:  Discussed the use of AI scribe software for clinical note transcription with the patient, who gave verbal consent to proceed.  Felicia Good is a 63 y.o. female who returns for 1 month follow-up for cardiomyopathy.  Patient comes in the clinic by herself today. She reports feeling "tired and worn out" and attributes this to recent stress and personal circumstances, including the death of her mother due to heart failure in April of the previous year and ongoing family issues. The patient also mentions stomach and bowel issues that have been ongoing since August, which have led to feelings of dehydration.  She notes that she feels dehydrated because she is limiting the amount of water she is drinking due to worry of exacerbating her heart failure.  Despite these  issues, the patient reports being active, taking care of grandchildren and walking her dog. The patient is currently on carvedilol and Entresto for her cardiomyopathy, she notes she takes her blood pressure at home daily and her average is typically, typically upper 90s/60s. The patient's energy levels seem to fluctuate, with some days being more active than others.  She weighs herself daily and has not had an increase in weight.  Today she is without any chest pain at rest, exertional angina, shortness of breath, orthopnea, PND, syncope, near syncope, leg swelling.    Review of Systems  Constitutional: Positive for malaise/fatigue. Negative for weight gain and weight loss.  Cardiovascular:  Negative for chest pain, claudication, dyspnea on exertion, irregular heartbeat, leg swelling, near-syncope, orthopnea, palpitations, paroxysmal nocturnal dyspnea and syncope.  Respiratory:  Negative for cough, hemoptysis and shortness of breath.   Gastrointestinal:  Negative for abdominal pain, hematochezia and melena.  Genitourinary:  Negative for hematuria.  Neurological:  Negative for dizziness and light-headedness.     See HPI     Home Medications:    Prior to Admission medications   Medication Sig Start Date End Date Taking? Authorizing Provider  ASPIRIN 81 PO Take by mouth.    [provider]  atorvastatin (LIPITOR) 20 MG tablet Take 1 tablet (20 mg total) by mouth daily. 11/15/23   Melida Quitter, PA  busPIRone (BUSPAR) 10 MG tablet Take 1 tablet (10 mg total) by mouth 2 (two) times daily. 11/15/23   Edstrom,  Morgan A, PA  carvedilol (COREG) 3.125 MG tablet Take 1 tablet (3.125 mg total) by mouth 2 (two) times daily. 11/17/23 02/15/24  Rollene Rotunda, MD  Cholecalciferol (VITAMIN D3) 2000 units TABS Take 1 tablet by mouth daily.    [provider]  citalopram (CELEXA) 40 MG tablet Take 1 tablet (40 mg total) by mouth daily. 11/15/23   Melida Quitter, PA  clonazePAM (KLONOPIN)  0.5 MG tablet Take 1 tablet (0.5 mg total) by mouth at bedtime as needed for anxiety. 06/11/23   Sandre Kitty, MD  cyclobenzaprine (FLEXERIL) 10 MG tablet Take 1 tablet (10 mg total) by mouth 3 (three) times daily as needed for muscle spasms. 01/06/23   Melida Quitter, PA  dicyclomine (BENTYL) 20 MG tablet Take 1 tablet (20 mg total) by mouth 3 (three) times daily before meals. 11/15/23   Melida Quitter, PA  famotidine (PEPCID) 20 MG tablet Take 1 tablet (20 mg total) by mouth at bedtime. 01/06/23   Melida Quitter, PA  fluticasone (FLONASE) 50 MCG/ACT nasal spray Place 1 spray into both nostrils 2 (two) times daily as needed for allergies or rhinitis.    [provider]  pantoprazole (PROTONIX) 40 MG tablet Take 1 tablet (40 mg total) by mouth 2 (two) times daily before a meal. 01/06/23   Edstrom, Simmie Davies, PA  Prenatal Vit-Fe Fumarate-FA (PRENATAL VITAMIN PO) Take 1 tablet by mouth daily.    [provider]  sacubitril-valsartan (ENTRESTO) 24-26 MG Take 1 tablet by mouth 2 (two) times daily. 11/17/23   Rollene Rotunda, MD  TRIAMCINOLONE ACETONIDE EX 1 application Topical 2 times daily    [provider]   Studies Reviewed:       Coronary CTA 09/27/2023 1. Coronary calcium score of 0. This was 0 percentile for age-, sex, and race-matched controls.   2. Total plaque volume 0 mm3 which is 0 percentile for age- and sex-matched controls (calcified plaque 0 mm3; non-calcified plaque 0 mm3). TPV is 0.   3. Normal coronary origin with right dominance.   4. Normal coronary arteries.  Echocardiogram 07/16/2023 1. Left ventricular ejection fraction, by estimation, is 35 to 40%. The  left ventricle has moderately decreased function. The left ventricle  demonstrates global hypokinesis. Left ventricular diastolic parameters  were normal.   2. Right ventricular systolic function is normal. The right ventricular  size is normal.   3. Left atrial size was mildly dilated.    4. The mitral valve is abnormal. Mild mitral valve regurgitation. No  evidence of mitral stenosis. There is mild prolapse of anterior leaflet of  the mitral valve.   5. The aortic valve is tricuspid. There is mild calcification of the  aortic valve. Aortic valve regurgitation is not visualized. Aortic valve  sclerosis/calcification is present, without any evidence of aortic  stenosis.   6. The inferior vena cava is dilated in size with >50% respiratory  variability, suggesting right atrial pressure of 8 mmHg.   Risk Assessment/Calculations:             Physical Exam:   VS:  BP 94/64 (BP Location: Right Arm, Patient Position: Sitting, Cuff Size: Normal)   Pulse 72   Ht 5\' 7"  (1.702 m)   Wt 152 lb (68.9 kg)   LMP 11/26/2013   SpO2 98%   BMI 23.81 kg/m    Wt Readings from Last 3 Encounters:  12/14/23 152 lb (68.9 kg)  11/17/23 151 lb 6.4 oz (68.7 kg)  11/15/23 152 lb 12 oz (69.3 kg)    Constitutional:      Appearance: Normal and healthy appearance. Not in distress.  HENT:     Head: Normocephalic.  Neck:     Vascular: JVD normal.  Pulmonary:     Effort: Pulmonary effort is normal.     Breath sounds: Normal breath sounds.  Chest:     Chest wall: Not tender to palpatation.  Cardiovascular:     PMI at left midclavicular line. Normal rate. Regular rhythm. Normal S1. Normal S2.      Murmurs: There is no murmur.     No gallop.  No click. No rub.  Pulses:    Intact distal pulses.  Edema:    Peripheral edema absent.  Musculoskeletal: Normal range of motion.     Cervical back: Normal range of motion and neck supple. Skin:    General: Skin is warm and dry.  Neurological:     General: No focal deficit present.     Mental Status: Alert, oriented to person, place, and time and oriented to person, place and time.  Psychiatric:        Mood and Affect: Mood and affect normal.        Behavior: Behavior is cooperative.        Thought Content: Thought content normal.         Assessment and Plan:  HFrEF / Nonischemic cardiomyopathy Echocardiogram 06/2023 w/ LVEF 35-40%, global hypokinesis Coronary CTA 09/2023 with normal coronary arteries and CAC of 0 Today she is euvolemic and well compensated on exam.  NYHA class I She is without any overt heart failure symptoms.  She denies exertional angina, dyspnea, orthopnea, PND, leg swelling.  Her weight has been stable at 151-152lbs over the last several visits. -GDMT optimization is limited today in the setting of hypotension (94/64) -She does deny any lightheadedness, dizziness, syncope.  She does note lack of energy/fatigue. -I will continue her carvedilol 3.125 mg twice daily and Entresto 24-26 mg twice daily.  She will monitor her blood pressure daily and notify office for consistent blood pressures less than 90 systolic. -Plan for repeat echocardiogram in 1 month given goal-directed medical therapy has been reached in the setting of hypotension -Per Dr. Antoine Poche consider TEE if ejection fraction remains decreased and consider PYP scanning although amyloid is not strongly suspected -Focus on stress reduction and increase hydration to 64 ounces daily -BMET and CBC today  Mitral valve prolapse Echocardiogram 06/2023 with mild prolapse of anterior leaflet -Repeat echocardiogram as above  Hyperlipidemia LDL 91, HDL 65, TG 100 on 10/6107 LDL under excellent control with goal of less than 100 -Continue atorvastatin 20 mg daily           Dispo:  Return in about 1 month (around 01/11/2024).  Signed, Denyce Robert, NP

## 2023-12-14 NOTE — Patient Instructions (Addendum)
 Medication Instructions:  The current medical regimen is effective;  continue present plan and medications as directed. Please refer to the Current Medication list given to you today.  *If you need a refill on your cardiac medications before your next appointment, please call your pharmacy*  Lab Work: CBC AND BMET If you have labs (blood work) drawn today and your tests are completely normal, you will receive your results only by:  MyChart Message (if you have MyChart) OR  A paper copy in the mail If you have any lab test that is abnormal or we need to change your treatment, we will call you to review the results.  Testing/Procedures: Your physician has requested that you have an echocardiogram. Echocardiography is a painless test that uses sound waves to create images of your heart. It provides your doctor with information about the size and shape of your heart and how well your heart's chambers and valves are working. This procedure takes approximately one hour. There are no restrictions for this procedure. Please do NOT wear cologne, perfume, aftershave, or lotions (deodorant is allowed). Please arrive 15 minutes prior to your appointment time.  Please note: We ask at that you not bring children with you during ultrasound (echo/ vascular) testing. Due to room size and safety concerns, children are not allowed in the ultrasound rooms during exams. Our front office staff cannot provide observation of children in our lobby area while testing is being conducted. An adult accompanying a patient to their appointment will only be allowed in the ultrasound room at the discretion of the ultrasound technician under special circumstances. We apologize for any inconvenience.   Follow-Up: At [facility], you and your health needs are our priority.  As part of our continuing mission to provide you with exceptional heart care, we have created designated Provider Care Teams.  These Care Teams include  your primary Cardiologist (physician) and Advanced Practice Providers (APPs -  Physician Assistants and Nurse Practitioners) who all work together to provide you with the care you need, when you need it.  Your next appointment:   1 month(s)(AFTER ECHO)  Provider:   Rise Paganini, DNP-C

## 2023-12-15 ENCOUNTER — Encounter: Payer: Self-pay | Admitting: Gastroenterology

## 2023-12-15 ENCOUNTER — Other Ambulatory Visit: Payer: Medicaid Other

## 2023-12-15 ENCOUNTER — Ambulatory Visit: Payer: Medicaid Other | Admitting: Gastroenterology

## 2023-12-15 VITALS — BP 88/52 | HR 70 | Ht 67.0 in | Wt 151.4 lb

## 2023-12-15 DIAGNOSIS — K6289 Other specified diseases of anus and rectum: Secondary | ICD-10-CM | POA: Diagnosis not present

## 2023-12-15 DIAGNOSIS — R197 Diarrhea, unspecified: Secondary | ICD-10-CM | POA: Diagnosis not present

## 2023-12-15 DIAGNOSIS — R194 Change in bowel habit: Secondary | ICD-10-CM | POA: Diagnosis not present

## 2023-12-15 DIAGNOSIS — R7989 Other specified abnormal findings of blood chemistry: Secondary | ICD-10-CM | POA: Diagnosis not present

## 2023-12-15 DIAGNOSIS — R7401 Elevation of levels of liver transaminase levels: Secondary | ICD-10-CM

## 2023-12-15 DIAGNOSIS — K7689 Other specified diseases of liver: Secondary | ICD-10-CM

## 2023-12-15 LAB — BASIC METABOLIC PANEL
BUN/Creatinine Ratio: 25 (ref 12–28)
BUN: 16 mg/dL (ref 8–27)
CO2: 23 mmol/L (ref 20–29)
Calcium: 9.5 mg/dL (ref 8.7–10.3)
Chloride: 106 mmol/L (ref 96–106)
Creatinine, Ser: 0.65 mg/dL (ref 0.57–1.00)
Glucose: 98 mg/dL (ref 70–99)
Potassium: 4.3 mmol/L (ref 3.5–5.2)
Sodium: 143 mmol/L (ref 134–144)
eGFR: 98 mL/min/{1.73_m2} (ref >59)

## 2023-12-15 LAB — CBC
Hematocrit: 37.5 % (ref 34.0–46.6)
Hemoglobin: 12.5 g/dL (ref 11.1–15.9)
MCH: 32.1 pg (ref 26.6–33.0)
MCHC: 33.3 g/dL (ref 31.5–35.7)
MCV: 96 fL (ref 79–97)
Platelets: 213 10*3/uL (ref 150–450)
RBC: 3.89 x10E6/uL (ref 3.77–5.28)
RDW: 13.2 % (ref 11.7–15.4)
WBC: 5.4 10*3/uL (ref 3.4–10.8)

## 2023-12-15 MED ORDER — HYDROCORTISONE (PERIANAL) 2.5 % EX CREA: 1.0000 | TOPICAL_CREAM | Freq: Two times a day (BID) | CUTANEOUS | 1 refills | Status: AC

## 2023-12-15 NOTE — Patient Instructions (Addendum)
 Your provider has requested that you go to the basement level for lab work before leaving today. Press "B" on the elevator. The lab is located at the first door on the left as you exit the elevator.  We have sent the following medications to your pharmacy for you to pick up at your convenience: Hydrocortisone 2.5 % cream - Cover Preparation H suppository twice times daily as needed.  Apply Destin to perianal area as needed.  BOWEL PURGE:  This will aid in cleaning out your bowels.   OVER THE COUNTER SHOPPING GUIDE:  Purchase 1 (one) 119 GRAM bottle of Miralax Purchase 1 (one) box of 5 mg Dulcolax tablets Purchase 1 (one) 32 ounce bottle of Gatorade  STEPS:  Mix the entire bottle of Miralax in the 32 ounces of Gatorade and stir to dissolve completely. Take 4 dulcolax tablets, then wait 1 hour After 1 hour, drink the Miralax solution you have prepared. To reduce your risk for nausea, drink slowly and over the next 2-3 hours. You should expect results within 1 to 6 hours after completing this bowel purge.

## 2023-12-15 NOTE — Progress Notes (Addendum)
 12/15/2023 Felicia Good [id number] 09/28/1961   HISTORY OF PRESENT ILLNESS: This is a 63 year old female who was previously patient of Dr. Regino Schultze and then a patient of Dr. Orvan Falconer.  Her care will now be assumed by Dr. Doy Hutching.  She was initially referred back here again on this occasion, last being seen in 07/2021 for her GERD and IBS, for evaluation of elevated ALT.  This was initially elevated 11 months ago at 37 and then when rechecked 5 months ago was elevated at 44.  All other LFTs are normal.  Ultrasound showed normal gallbladder and normal liver except for a 1.7 cm septated cyst.  She is on Lipitor for the past couple of years.  No upper abdominal pain.  While she is here that she also wants to discuss her change in bowel habits.  Says that for the past several months she has had significant change in bowel habits.  Says that she takes dicyclomine 2-3 times daily with food and sometimes that would cause her to become constipated, but everything seemed to be moving along just fine until a few months ago.  She says that she is no longer having formed logs of stool.  The stool is pieces of stool, very soft, a lot of times like soft serve ice cream.  She has a lot of wet gas.  More bloating and noise and movement in her abdomen.  She says that she had to help her friend with some outdoor farm type animals some months back and thought maybe she could have developed a parasite or something from that.  No blood in the stool.  No significant abdominal pain per se.  She does report some intermittent perianal discomfort.  Last colonoscopy was May 2016 was normal at that time.  Past Medical History:  Diagnosis Date   Anxiety    Cardiomyopathy (HCC)    Depression    Dyslipidemia    IBS (irritable bowel syndrome)    Migraine headache    Mitral valve prolapse    Panic attacks    Rectal polyp    RLS (restless legs syndrome)    Past Surgical History:  Procedure Laterality Date   RECTAL  POLYPECTOMY      reports that she has been smoking cigarettes. She has a 10 pack-year smoking history. She has been exposed to tobacco smoke. She has never used smokeless tobacco. She reports current alcohol use. She reports that she does not use drugs. family history includes Breast cancer in her maternal aunt; Cirrhosis in her father; Dementia in her mother; Heart failure in her father and mother; Hypertension in her father and mother; Other in her father; Stroke in her father and mother. No Known Allergies    Outpatient Encounter Medications as of 12/15/2023  Medication Sig   ASPIRIN 81 PO Take by mouth.   atorvastatin (LIPITOR) 20 MG tablet Take 1 tablet (20 mg total) by mouth daily.   busPIRone (BUSPAR) 10 MG tablet Take 1 tablet (10 mg total) by mouth 2 (two) times daily.   carvedilol (COREG) 3.125 MG tablet Take 1 tablet (3.125 mg total) by mouth 2 (two) times daily.   Cholecalciferol (VITAMIN D3) 2000 units TABS Take 1 tablet by mouth daily.   citalopram (CELEXA) 40 MG tablet Take 1 tablet (40 mg total) by mouth daily.   clonazePAM (KLONOPIN) 0.5 MG tablet Take 1 tablet (0.5 mg total) by mouth at bedtime as needed for anxiety.   cyclobenzaprine (FLEXERIL) 10 MG tablet  Take 1 tablet (10 mg total) by mouth 3 (three) times daily as needed for muscle spasms.   dicyclomine (BENTYL) 20 MG tablet Take 1 tablet (20 mg total) by mouth 3 (three) times daily before meals.   famotidine (PEPCID) 20 MG tablet Take 1 tablet (20 mg total) by mouth at bedtime.   fluticasone (FLONASE) 50 MCG/ACT nasal spray Place 1 spray into both nostrils 2 (two) times daily as needed for allergies or rhinitis.   pantoprazole (PROTONIX) 40 MG tablet Take 1 tablet (40 mg total) by mouth 2 (two) times daily before a meal.   Prenatal Vit-Fe Fumarate-FA (PRENATAL VITAMIN PO) Take 1 tablet by mouth daily.   sacubitril-valsartan (ENTRESTO) 24-26 MG Take 1 tablet by mouth 2 (two) times daily.   TRIAMCINOLONE ACETONIDE EX 1  application Topical 2 times daily   No facility-administered encounter medications on file as of 12/15/2023.    REVIEW OF SYSTEMS  : All other systems reviewed and negative except where noted in the History of Present Illness.   PHYSICAL EXAM: BP (!) 88/52   Pulse 70   Ht 5\' 7"  (1.702 m)   Wt 151 lb 6 oz (68.7 kg)   LMP 11/26/2013   BMI 23.71 kg/m  General: Well developed white female in no acute distress Head: Normocephalic and atraumatic Eyes:  Sclerae anicteric, conjunctiva pink. Ears: Normal auditory acuity Lungs: Clear throughout to auscultation; no W/R/R. Heart: Regular rate and rhythm; no M/R/G. Abdomen: Soft, non-distended.  BS present.  Minimal diffuse TTP. Rectal:   No external abnormalities noted. Musculoskeletal: Symmetrical with no gross deformities  Skin: No lesions on visible extremities Extremities: No edema  Neurological: Alert oriented x 4, grossly non-focal Psychological:  Alert and cooperative. Normal mood and affect  ASSESSMENT AND PLAN: *Elevated ALT of 37 on year ago and then 44 when checked 5 months ago:  No fatty liver on U/S, just a liver cyst.  She is on Lipitor for a couple of years.  Will check viral hepatitis studies for B and C, but otherwise just will monitor periodically for now. *Change in bowel habits/diarrhea: Has had extreme change in bowel habits over the last several months.  She is concerned about possible parasite, etc. As she had been working with outdoor barn animals, etc. some months back.  Will check a stool culture and ova and parasite.  Will also instruct her on a MiraLAX bowel purge to see if we can kind of reset things.  If stool studies are negative and bowel purge does not help then we will proceed with repeat colonoscopy as last was in May 2016 was normal at that time. *Perianal discomfort: No abnormality seen on exam today.  Can use Desitin externally as needed and will prescribe some hydrocortisone to use internally as needed.   Prescription sent to pharmacy.  CC:  Melida Quitter, PA   I have reviewed the clinic note as outlined by Doug Sou, PA and agree with the assessment, plan and medical decision making.   Ms. Horace has been followed in our office for history of elevated ALT ranging from 37-44.  No hepatic steatosis on ultrasound-only a liver cyst.  Agree with plan to evaluate for viral hepatitides.  She has been on Lipitor which could explain the elevated ALT.  Change in bowel habits will be investigated with stool studies-can perform a colonoscopy in the future if she is coming due for one for screening purposes.  Maren Beach, MD

## 2023-12-16 LAB — HEPATITIS C ANTIBODY: Hepatitis C Ab: NONREACTIVE

## 2023-12-16 LAB — HEPATITIS B SURFACE ANTIGEN: Hepatitis B Surface Ag: NONREACTIVE

## 2023-12-16 LAB — HEPATITIS B SURFACE ANTIBODY,QUALITATIVE: Hep B S Ab: REACTIVE — AB

## 2023-12-17 ENCOUNTER — Telehealth: Payer: Self-pay | Admitting: Cardiology

## 2023-12-17 NOTE — Telephone Encounter (Signed)
 Patient reports did not sleep good last night. Patient reports having large loose bowel movement today and feeling really exhausted. Patient reports she has been having loose bowel movements for months and has appointment with GI provider next week.  Patient reports around  3 :15 today she tried to take hear blood pressure and was unable to X 3 times. Patient reports she turned upside on her exercise machine and laid on bed. After this patient  reports blood pressure then  119/49, hr 59. Around 4:20 blood pressure 128/60 , hr 63. Denies dizziness, denies chest pain, reports lightheadedness.  Denies sob, able to eat. Patient reports limited amount of fluid. Reports drink 2 cups coffee and cup of water. Advised patient to continue to monitor blood pressure and stay hydrated. Advise patient to continue to monitor blood pressure and if any of these symptoms return systolic is 90 or less and dizziness, sob , lightheadedness to go to Emergency Room. Patient verbalized an understanding.

## 2023-12-17 NOTE — Telephone Encounter (Signed)
 Pt c/o BP issue: STAT if pt c/o blurred vision, one-sided weakness or slurred speech  1. What are your last 5 BP readings? 119/48 HR 59 128/60 HR 63  2. Are you having any other symptoms (ex. Dizziness, headache, blurred vision, passed out)? Lethargic and slower speech   3. What is your BP issue? Patient states that she is still having issues with lower BP readings.

## 2023-12-20 ENCOUNTER — Telehealth: Payer: Self-pay | Admitting: Gastroenterology

## 2023-12-20 NOTE — Telephone Encounter (Signed)
 PT needs a PA for the fecal test for insurance as well as to clarify why  it is needed. [phone]; [place]. Please advise.

## 2023-12-24 NOTE — Telephone Encounter (Signed)
 Spoke with patient and informed her to complete stool study. Once its completed if insurance needs any additional information they will request it. Patient voiced understanding.

## 2023-12-24 NOTE — Telephone Encounter (Signed)
 Patient informed to submit stool studies. If insurance needs any additional codes after completing study we will provide then.

## 2023-12-27 ENCOUNTER — Other Ambulatory Visit

## 2023-12-27 DIAGNOSIS — R194 Change in bowel habit: Secondary | ICD-10-CM | POA: Diagnosis not present

## 2023-12-27 DIAGNOSIS — R7989 Other specified abnormal findings of blood chemistry: Secondary | ICD-10-CM

## 2023-12-27 DIAGNOSIS — R197 Diarrhea, unspecified: Secondary | ICD-10-CM | POA: Diagnosis not present

## 2023-12-29 LAB — SALMONELLA/SHIGELLA CULT, CAMPY EIA AND SHIGA TOXIN RFL ECOLI
MICRO NUMBER: 16150405
MICRO NUMBER:: 16150406
MICRO NUMBER:: 16150407
Result:: NOT DETECTED
SHIGA RESULT:: NOT DETECTED
SPECIMEN QUALITY: ADEQUATE
SPECIMEN QUALITY:: ADEQUATE
SPECIMEN QUALITY:: ADEQUATE

## 2023-12-29 LAB — OVA AND PARASITE EXAMINATION
CONCENTRATE RESULT:: NONE SEEN
MICRO NUMBER:: 16150408
SPECIMEN QUALITY:: ADEQUATE
TRICHROME RESULT:: NONE SEEN

## 2024-01-06 ENCOUNTER — Ambulatory Visit (HOSPITAL_COMMUNITY): Payer: Medicaid Other | Attending: Internal Medicine

## 2024-01-06 ENCOUNTER — Ambulatory Visit: Payer: Commercial Managed Care - HMO | Admitting: Cardiology

## 2024-01-06 DIAGNOSIS — I428 Other cardiomyopathies: Secondary | ICD-10-CM | POA: Insufficient documentation

## 2024-01-06 LAB — ECHOCARDIOGRAM COMPLETE
Area-P 1/2: 2.71 cm2
S' Lateral: 3.2 cm

## 2024-01-06 MED ORDER — PERFLUTREN LIPID MICROSPHERE
1.0000 mL | INTRAVENOUS | Status: AC | PRN
Start: 2024-01-06 — End: 2024-01-06
  Administered 2024-01-06: 1 mL via INTRAVENOUS

## 2024-01-11 ENCOUNTER — Other Ambulatory Visit (HOSPITAL_COMMUNITY): Payer: Self-pay

## 2024-01-11 ENCOUNTER — Encounter: Payer: Self-pay | Admitting: Emergency Medicine

## 2024-01-11 ENCOUNTER — Ambulatory Visit: Payer: Medicaid Other | Attending: Emergency Medicine | Admitting: Emergency Medicine

## 2024-01-11 VITALS — BP 108/72 | HR 60 | Ht 67.0 in | Wt 153.2 lb

## 2024-01-11 DIAGNOSIS — I428 Other cardiomyopathies: Secondary | ICD-10-CM

## 2024-01-11 DIAGNOSIS — E782 Mixed hyperlipidemia: Secondary | ICD-10-CM | POA: Diagnosis not present

## 2024-01-11 DIAGNOSIS — I341 Nonrheumatic mitral (valve) prolapse: Secondary | ICD-10-CM

## 2024-01-11 DIAGNOSIS — I5020 Unspecified systolic (congestive) heart failure: Secondary | ICD-10-CM | POA: Diagnosis not present

## 2024-01-11 DIAGNOSIS — F17200 Nicotine dependence, unspecified, uncomplicated: Secondary | ICD-10-CM

## 2024-01-11 MED ORDER — EMPAGLIFLOZIN 10 MG PO TABS
10.0000 mg | ORAL_TABLET | Freq: Every day | ORAL | 1 refills | Status: DC
  Filled 2024-01-11: qty 30, 30d supply, fill #0
  Filled 2024-02-14: qty 30, 30d supply, fill #1
  Filled 2024-03-13 (×2): qty 30, 30d supply, fill #2
  Filled 2024-04-13: qty 30, 30d supply, fill #3
  Filled 2024-05-15 (×2): qty 30, 30d supply, fill #4
  Filled 2024-06-21: qty 30, 30d supply, fill #5

## 2024-01-11 NOTE — Patient Instructions (Addendum)
 Medication Instructions:  START TAKING JARDIANCE 10 MG DAILY.    Lab Work: SPEP, UPEP, AND BMET  TO BE DONE IN 2 WEEKS.   Testing/Procedures: PYP SCAN    Follow-Up: At [facility], you and your health needs are our priority.  As part of our continuing mission to provide you with exceptional heart care, we have created designated Provider Care Teams.  These Care Teams include your primary Cardiologist (physician) and Advanced Practice Providers (APPs -  Physician Assistants and Nurse Practitioners) who all work together to provide you with the care you need, when you need it.   Your next appointment:   3 month(s)  Provider:   Rollene Rotunda, MD  OR Rise Paganini, DNP  Other Instructions

## 2024-01-11 NOTE — Telephone Encounter (Signed)
 Prior auth team please assist with PA

## 2024-01-11 NOTE — Progress Notes (Signed)
 Cardiology Office Note:    Date:  01/11/2024  ID:  Parks Ranger, DOB [birth year], MRN [id number] PCP: Melida Quitter, PA  [facility] Providers Cardiologist:  Rollene Rotunda, MD       Patient Profile:      Chief Complaint: 1 month follow-up for HFrEF  History of Present Illness:  Felicia Good is a 63 y.o. female with visit-pertinent history of ischemic cardiomyopathy, HFrEF, and mild mitral regurgitation, hyperlipidemia   She established with cardiology service on 09/09/2023 for evaluation of shortness of breath.  The shortness of breath started about a year and a half ago, she would notice it while walking up small hills near her home.  Echocardiogram September 2024 showed LVEF 35-40%, global hypokinesis, mild MR and mild anterior leaflet prolapse.  Her cardiomyopathy was unclear at that time.  Her lisinopril was discontinued and she was started on Entresto 24/26 mg twice daily.  Coronary CTA was ordered for ischemic evaluation and completed on 09/27/2023 showing coronary calcium score of 0 and total plaque volume of 0 with no evidence of CAD.  She was seen in clinic on 10/28/2023 and doing well at the time.  She was started on carvedilol 3.125 mg twice daily.  She was last seen in clinic on 12/14/2023. She reports feeling "tired and worn out" and attributes this to recent stress and personal circumstances.  GDMT titration was limited due to low blood pressure of 94/64.  Echocardiogram was repeated on 01/06/2024 showing LVEF 40-45%, LV with mildly decreased function, no RWMA, normal diastolic parameters, RV function and size normal, trivial MR, mild calcification of aortic valve.   Discussed the use of AI scribe software for clinical note transcription with the patient, who gave verbal consent to proceed.  Today the patient presents for follow-up visit for HFrEF.  She is without any acute cardiovascular concerns or complaints today.  Since then her last OV, she has been adhering to her  prescribed medication regimen. She reports an improvement in her overall well-being, with increased activity levels including daily walks and housework.  She is able to walk up hills without difficulty as well as go to the grocery store, sweep, and vacuum.  She does note she will get mildly SOB only when walking up hills however denies any other anginal symptoms.  She has been mindful of her history of dehydration, she has been consuming water, pineapple juice, and Gatorade regularly. She has not experienced dehydration since the last visit.  The patient has a long history of smoking and is currently struggling to quit completely. She has experimented with CBD smokes as a potential aid to quit smoking. She reports smoking three to five cigarettes a day currently. She denies any significant alcohol use, stating that a bottle of wine would last her a month.   The patient reports a strong family history of heart failure, with both parents, an aunt, and an uncle having suffered from the condition.  She is without any chest pains, syncope, presyncope, lightheadedness, dizziness, palpitations, claudication, melena, hematochezia    Review of systems:  Please see the history of present illness. All other systems are reviewed and otherwise negative.     Home Medications:    Current Meds  Medication Sig   ASPIRIN 81 PO Take by mouth.   atorvastatin (LIPITOR) 20 MG tablet Take 1 tablet (20 mg total) by mouth daily.   busPIRone (BUSPAR) 10 MG tablet Take 1 tablet (10 mg total) by mouth 2 (two) times  daily.   carvedilol (COREG) 3.125 MG tablet Take 1 tablet (3.125 mg total) by mouth 2 (two) times daily.   Cholecalciferol (VITAMIN D3) 2000 units TABS Take 1 tablet by mouth daily.   citalopram (CELEXA) 40 MG tablet Take 1 tablet (40 mg total) by mouth daily.   clonazePAM (KLONOPIN) 0.5 MG tablet Take 1 tablet (0.5 mg total) by mouth at bedtime as needed for anxiety.   cyclobenzaprine (FLEXERIL) 10 MG tablet  Take 1 tablet (10 mg total) by mouth 3 (three) times daily as needed for muscle spasms.   dicyclomine (BENTYL) 20 MG tablet Take 1 tablet (20 mg total) by mouth 3 (three) times daily before meals.   empagliflozin (JARDIANCE) 10 MG TABS tablet Take 1 tablet (10 mg total) by mouth daily before breakfast.   famotidine (PEPCID) 20 MG tablet Take 1 tablet (20 mg total) by mouth at bedtime.   fluticasone (FLONASE) 50 MCG/ACT nasal spray Place 1 spray into both nostrils 2 (two) times daily as needed for allergies or rhinitis.   hydrocortisone (ANUSOL-HC) 2.5 % rectal cream Place 1 Application rectally 2 (two) times daily.   pantoprazole (PROTONIX) 40 MG tablet Take 1 tablet (40 mg total) by mouth 2 (two) times daily before a meal.   Prenatal Vit-Fe Fumarate-FA (PRENATAL VITAMIN PO) Take 1 tablet by mouth daily.   sacubitril-valsartan (ENTRESTO) 24-26 MG Take 1 tablet by mouth 2 (two) times daily.   TRIAMCINOLONE ACETONIDE EX 1 application Topical 2 times daily   Studies Reviewed:   EKG Interpretation Date/Time:  Tuesday January 11 2024 10:21:47 EDT Ventricular Rate:  60 PR Interval:  166 QRS Duration:  84 QT Interval:  410 QTC Calculation: 410 R Axis:   58  Text Interpretation: Normal sinus rhythm Confirmed by Rise Paganini [phone]) on 01/11/2024 11:00:12 AM    Echocardiogram 01/06/2024 1. Left ventricular ejection fraction, by estimation, is 40 to 45%. The  left ventricle has mildly decreased function. The left ventricle has no  regional wall motion abnormalities. Left ventricular diastolic parameters  were normal.   2. Right ventricular systolic function is normal. The right ventricular  size is normal.   3. The mitral valve is normal in structure. Trivial mitral valve  regurgitation. No evidence of mitral stenosis.   4. The aortic valve is tricuspid. There is mild calcification of the  aortic valve. Aortic valve regurgitation is not visualized. No aortic  stenosis is present.   5. The  inferior vena cava is normal in size with greater than 50%  respiratory variability, suggesting right atrial pressure of 3 mmHg.   Coronary CTA 09/27/2023 1. Coronary calcium score of 0. This was 0 percentile for age-, sex, and race-matched controls.   2. Total plaque volume 0 mm3 which is 0 percentile for age- and sex-matched controls (calcified plaque 0 mm3; non-calcified plaque 0 mm3). TPV is 0.   3. Normal coronary origin with right dominance.   4. Normal coronary arteries. Risk Assessment/Calculations:             Physical Exam:   VS:  BP 108/72   Pulse 60   Ht 5\' 7"  (1.702 m)   Wt 153 lb 3.2 oz (69.5 kg)   LMP 11/26/2013   SpO2 97%   BMI 23.99 kg/m    Wt Readings from Last 3 Encounters:  01/11/24 153 lb 3.2 oz (69.5 kg)  12/15/23 151 lb 6 oz (68.7 kg)  12/14/23 152 lb (68.9 kg)    GEN: Well nourished, well developed  in no acute distress NECK: No JVD; No carotid bruits CARDIAC: RRR, no murmurs, rubs, gallops RESPIRATORY:  Clear to auscultation without rales, wheezing or rhonchi  ABDOMEN: Soft, non-tender, non-distended EXTREMITIES:  No edema; No acute deformity      Assessment and Plan:  HFrEF / Nonischemic Cardiomyopathy The etiology of this is not clear at this time Echocardiogram 06/2023 w/ LVEF 35-40%, global hypokinesis Coronary CTA 09/2023 with normal coronary arteries and CAC of 0 Echocardiogram 12/2023 w/ LVEF 40-45%, LV mildly decreased function EKG today without acute ischemic changes -Today she is euvolemic and well compensated on exam.  NYHA class I.   Weight remains stable.  Remains overall asymptomatic. -GDMT optimization continues to be limited in setting of low normal BP (108/72) as I am unable to increase her Carvedilol / Entresto or begin Spironolactone at this time  -I will start Jardiance 10 mg daily to further optimize GDMT -Continue Carvedilol 3.125 mg twice daily and Entresto 24-26 mg twice daily -Will order PYP scanning/order set for  amyloidosis as etiology remains unclear. Messaged with Dr. Antoine Poche who agreed with plan.  -She has significant family history of HF which raises concern for genetic predisposition -BMET x2 weeks  Mitral valve prolapse Echocardiogram 06/2023 with mild prolapse of anterior leaflet  Repeat echocardiogram shows the mitral valve is normal in structure with no evidence of MVP   Hyperlipidemia LDL 91, HDL 65, TG 100 on 04/8294 LDL under excellent control with goal of less than 100 -Continue Atorvastatin 20 mg daily  Tobacco use  Long-time smoker, currently down to 3-5 cigarettes per day.  Action stage currently. -Smoking cessation crucial for cardiovascular health -Encouraged continued efforts to quit smoking           Dispo:  Return in about 3 months (around 04/12/2024).  Signed, Denyce Robert, NP

## 2024-01-12 ENCOUNTER — Other Ambulatory Visit (HOSPITAL_COMMUNITY): Payer: Self-pay

## 2024-01-12 ENCOUNTER — Telehealth: Payer: Self-pay | Admitting: Pharmacy Technician

## 2024-01-12 NOTE — Telephone Encounter (Signed)
 Pharmacy Patient Advocate Encounter  Received notification from  [employer]  that Prior Authorization for [place] Pepper has been APPROVED from 01/12/24 to 01/11/25. Ran test claim, Copay is $4.00. This test claim was processed through [facility]- copay amounts may vary at other pharmacies due to pharmacy/plan contracts, or as the patient moves through the different stages of their insurance plan.   PA #/Case ID/Reference #: [id number]

## 2024-01-12 NOTE — Telephone Encounter (Signed)
 Pharmacy Patient Advocate Encounter   Received notification from CoverMyMeds that prior authorization for Jardiance is required/requested.   Insurance verification completed.   The patient is insured through  [employer]  .   Per test claim: PA required; PA submitted to above mentioned insurance via CoverMyMeds Key/confirmation #/EOC [facility] Status is pending

## 2024-01-14 ENCOUNTER — Other Ambulatory Visit (HOSPITAL_COMMUNITY): Payer: Self-pay

## 2024-01-24 ENCOUNTER — Encounter (HOSPITAL_COMMUNITY)

## 2024-01-25 DIAGNOSIS — I341 Nonrheumatic mitral (valve) prolapse: Secondary | ICD-10-CM | POA: Diagnosis not present

## 2024-01-25 DIAGNOSIS — I5020 Unspecified systolic (congestive) heart failure: Secondary | ICD-10-CM | POA: Diagnosis not present

## 2024-01-25 DIAGNOSIS — E782 Mixed hyperlipidemia: Secondary | ICD-10-CM | POA: Diagnosis not present

## 2024-01-25 DIAGNOSIS — I428 Other cardiomyopathies: Secondary | ICD-10-CM | POA: Diagnosis not present

## 2024-01-25 DIAGNOSIS — E785 Hyperlipidemia, unspecified: Secondary | ICD-10-CM | POA: Diagnosis not present

## 2024-01-25 DIAGNOSIS — R0609 Other forms of dyspnea: Secondary | ICD-10-CM | POA: Diagnosis not present

## 2024-01-25 LAB — BASIC METABOLIC PANEL WITH GFR
BUN/Creatinine Ratio: 26 (ref 12–28)
BUN: 20 mg/dL (ref 8–27)
CO2: 23 mmol/L (ref 20–29)
Calcium: 9.5 mg/dL (ref 8.7–10.3)
Chloride: 106 mmol/L (ref 96–106)
Creatinine, Ser: 0.78 mg/dL (ref 0.57–1.00)
Glucose: 96 mg/dL (ref 70–99)
Potassium: 4.2 mmol/L (ref 3.5–5.2)
Sodium: 141 mmol/L (ref 134–144)
eGFR: 85 mL/min/{1.73_m2} (ref >59)

## 2024-01-27 ENCOUNTER — Other Ambulatory Visit: Payer: Self-pay

## 2024-01-27 DIAGNOSIS — I5020 Unspecified systolic (congestive) heart failure: Secondary | ICD-10-CM | POA: Diagnosis not present

## 2024-01-27 NOTE — Progress Notes (Signed)
 Lab order placed again per lab tech request, Gerline Legacy.

## 2024-01-31 ENCOUNTER — Telehealth: Payer: Self-pay | Admitting: Gastroenterology

## 2024-01-31 NOTE — Telephone Encounter (Signed)
 Felicia Good, please inform patient that recall colon is not due until 02/2025. If she is having GI symptoms she will need an OV. Thanks

## 2024-01-31 NOTE — Telephone Encounter (Signed)
 Patient wanting to schedule colon procedure. Recall say 2026. Please advise on scheduling.

## 2024-01-31 NOTE — Telephone Encounter (Signed)
 This pt last saw Shanda Bumps but is a patient of Dr Doy Hutching. She wants to make an appt for colon but is not due until 02/2025

## 2024-02-01 LAB — MULTIPLE MYELOMA PANEL, SERUM
Albumin SerPl Elph-Mcnc: 3.8 g/dL (ref 2.9–4.4)
Albumin/Glob SerPl: 1.3 (ref 0.7–1.7)
Alpha 1: 0.2 g/dL (ref 0.0–0.4)
Alpha2 Glob SerPl Elph-Mcnc: 0.8 g/dL (ref 0.4–1.0)
B-Globulin SerPl Elph-Mcnc: 1.1 g/dL (ref 0.7–1.3)
Gamma Glob SerPl Elph-Mcnc: 0.9 g/dL (ref 0.4–1.8)
Globulin, Total: 3 g/dL (ref 2.2–3.9)
IgA/Immunoglobulin A, Serum: 188 mg/dL (ref 87–352)
IgG (Immunoglobin G), Serum: 968 mg/dL (ref 586–1602)
IgM (Immunoglobulin M), Srm: 153 mg/dL (ref 26–217)
Total Protein: 6.8 g/dL (ref 6.0–8.5)

## 2024-02-04 LAB — UPEP/UIFE/LIGHT CHAINS/TP, 24-HR UR
ALPHA 1 URINE: 0 *Deleted
ALPHA-2-GLOBULIN, U: 100 *Deleted
Free Kappa Lt Chains,Ur: 8.4 mg/L (ref 1.17–86.46)
Free Lambda Lt Chains,Ur: 1.81 mg/L (ref 0.27–15.21)
GAMMA GLOBULIN URINE: 0 *Deleted
Immunofixation, Urine: 0 *Deleted
Kappa/Lambda Ratio,U: 4.64 *Deleted (ref 1.83–14.26)
M-SPIKE, %: 0 *Deleted
Protein, 24H Urine: 206 mg/(24.h) — ABNORMAL HIGH (ref 30–150)
Protein, Ur: 100 mg/dL

## 2024-02-04 LAB — MULTIPLE MYELOMA PANEL, SERUM

## 2024-02-07 ENCOUNTER — Other Ambulatory Visit: Payer: Self-pay | Admitting: Family Medicine

## 2024-02-07 DIAGNOSIS — K219 Gastro-esophageal reflux disease without esophagitis: Secondary | ICD-10-CM

## 2024-02-07 DIAGNOSIS — K589 Irritable bowel syndrome without diarrhea: Secondary | ICD-10-CM

## 2024-02-14 ENCOUNTER — Other Ambulatory Visit (HOSPITAL_COMMUNITY): Payer: Self-pay

## 2024-02-22 ENCOUNTER — Other Ambulatory Visit: Payer: Self-pay | Admitting: Family Medicine

## 2024-02-22 DIAGNOSIS — E785 Hyperlipidemia, unspecified: Secondary | ICD-10-CM

## 2024-03-06 ENCOUNTER — Ambulatory Visit: Admitting: Physician Assistant

## 2024-03-08 ENCOUNTER — Other Ambulatory Visit: Payer: Self-pay | Admitting: *Deleted

## 2024-03-08 ENCOUNTER — Other Ambulatory Visit

## 2024-03-08 DIAGNOSIS — R7301 Impaired fasting glucose: Secondary | ICD-10-CM | POA: Diagnosis not present

## 2024-03-08 DIAGNOSIS — R5383 Other fatigue: Secondary | ICD-10-CM

## 2024-03-08 DIAGNOSIS — R635 Abnormal weight gain: Secondary | ICD-10-CM

## 2024-03-08 DIAGNOSIS — E785 Hyperlipidemia, unspecified: Secondary | ICD-10-CM

## 2024-03-09 ENCOUNTER — Other Ambulatory Visit: Payer: Medicaid Other

## 2024-03-09 LAB — COMPREHENSIVE METABOLIC PANEL WITH GFR
ALT: 40 IU/L — ABNORMAL HIGH (ref 0–32)
AST: 34 IU/L (ref 0–40)
Albumin: 4.3 g/dL (ref 3.9–4.9)
Alkaline Phosphatase: 89 IU/L (ref 44–121)
BUN/Creatinine Ratio: 23 (ref 12–28)
BUN: 16 mg/dL (ref 8–27)
Bilirubin Total: 0.3 mg/dL (ref 0.0–1.2)
CO2: 24 mmol/L (ref 20–29)
Calcium: 9.5 mg/dL (ref 8.7–10.3)
Chloride: 106 mmol/L (ref 96–106)
Creatinine, Ser: 0.69 mg/dL (ref 0.57–1.00)
Globulin, Total: 2.4 g/dL (ref 1.5–4.5)
Glucose: 92 mg/dL (ref 70–99)
Potassium: 4.2 mmol/L (ref 3.5–5.2)
Sodium: 143 mmol/L (ref 134–144)
Total Protein: 6.7 g/dL (ref 6.0–8.5)
eGFR: 97 mL/min/{1.73_m2} (ref >59)

## 2024-03-09 LAB — HEMOGLOBIN A1C
Est. average glucose Bld gHb Est-mCnc: 114 mg/dL
Hgb A1c MFr Bld: 5.6 % (ref 4.8–5.6)

## 2024-03-09 LAB — CBC WITH DIFFERENTIAL/PLATELET
Basophils Absolute: 0 10*3/uL (ref 0.0–0.2)
Basos: 1 %
EOS (ABSOLUTE): 0.2 10*3/uL (ref 0.0–0.4)
Eos: 4 %
Hematocrit: 39.7 % (ref 34.0–46.6)
Hemoglobin: 13 g/dL (ref 11.1–15.9)
Immature Grans (Abs): 0 10*3/uL (ref 0.0–0.1)
Immature Granulocytes: 0 %
Lymphocytes Absolute: 1.5 10*3/uL (ref 0.7–3.1)
Lymphs: 34 %
MCH: 32.1 pg (ref 26.6–33.0)
MCHC: 32.7 g/dL (ref 31.5–35.7)
MCV: 98 fL — ABNORMAL HIGH (ref 79–97)
Monocytes Absolute: 0.4 10*3/uL (ref 0.1–0.9)
Monocytes: 8 %
Neutrophils Absolute: 2.3 10*3/uL (ref 1.4–7.0)
Neutrophils: 53 %
Platelets: 225 10*3/uL (ref 150–450)
RBC: 4.05 x10E6/uL (ref 3.77–5.28)
RDW: 13 % (ref 11.7–15.4)
WBC: 4.3 10*3/uL (ref 3.4–10.8)

## 2024-03-09 LAB — LIPID PANEL
Chol/HDL Ratio: 3.4 ratio (ref 0.0–4.4)
Cholesterol, Total: 200 mg/dL — ABNORMAL HIGH (ref 100–199)
HDL: 58 mg/dL (ref >39)
LDL Chol Calc (NIH): 124 mg/dL — ABNORMAL HIGH (ref 0–99)
Triglycerides: 99 mg/dL (ref 0–149)
VLDL Cholesterol Cal: 18 mg/dL (ref 5–40)

## 2024-03-09 LAB — TSH: TSH: 1.7 u[IU]/mL (ref 0.450–4.500)

## 2024-03-10 ENCOUNTER — Ambulatory Visit: Payer: Self-pay | Admitting: Family Medicine

## 2024-03-13 ENCOUNTER — Other Ambulatory Visit (HOSPITAL_COMMUNITY): Payer: Self-pay

## 2024-03-16 ENCOUNTER — Ambulatory Visit: Payer: Medicaid Other | Admitting: Family Medicine

## 2024-03-27 NOTE — Progress Notes (Addendum)
 03/28/2024 ARNETTE DRIGGS [id number] 21-Jan-1961  Referring provider: Noreene Bearded, PA Primary GI doctor: Dr. Yvone Herd ( Dr. Savannah Curlin)   ASSESSMENT AND PLAN:  IBS with worsening symptoms since sept with lower AB pressure, has decreased sensation of BM, formed to soft, has seen oily/shiny stools, floating stools, can be 1-2 hours after eating, 1-3 Bm's a day, she has had pressure LUQ, has bloating has done FODMAP and just rice/meats that helped settle her symptoms colonoscopy May 2016  unremarkable 08/2018 CT shows constipation Previous negative celiac 2019 -KUB to evaluate for stool burden with previous CT showing constipation - stool samples to rule out infection with ABX use, check for Cdiff  - CRP/ESR to rule out inflammation/IBD  -Pancreatic elastase due to oily stools and worse after foods -Can do trial of IBGARD daily -Add on citracel/benefiber, FODMAP, trial off lactulose and lifestyle changes discussed -Possible pelvis floor component, consider pelvic floor PT or anal rectal manometry.  Will schedule for CT AB and pelvis with contrast to evaluate further -Consider SIBO testing or xifaxin/flagyl trial pending results -Will schedule for endoscopic evaluation, discussed with patient and agrees with plan.  Screening colonoscopy May 2016 unremarkable colon, recall 10 years  GERD HIDA scan 09/2018 unremarkable, EF 57% 11/01/2018 EGD with Dr. Savannah Curlin - Normal esophagus. - Non- bleeding erosive gastropathy. Biopsied. - Erythematous duodenopathy. Biopsied. - The examination was otherwise normal.  well controlled at this time, continue the same -Lifestyle changes discussed, avoid NSAIDS, ETOH, hand out given to the patient  Elevated LFTs 07/23/2023 RUQ US  No fatty liver, no cholelithiasis, 1.7 cm septated cyst right lobe 12/15/2023 Negative Hep C, Hep B, has immunity to Hep B    Latest Ref Rng & Units 03/08/2024    9:32 AM 01/27/2024   10:35 AM 01/27/2024    9:54 AM  Hepatic  Function  Total Protein 6.0 - 8.5 g/dL 6.7  6.8  CANCELED   Albumin 3.9 - 4.9 g/dL 4.3     AST 0 - 40 IU/L 34     ALT 0 - 32 IU/L 40     Alk Phosphatase 44 - 121 IU/L 89     Total Bilirubin 0.0 - 1.2 mg/dL 0.3      Platelets 308  - need LFTs and CBC monitored every 6 months, - evaluation with imaging every 2-3 years.  -Continue to work on risk factor modification including diet exercise and control of risk factors including blood sugars. - follow up on amyloid imaging  Cardiomyopathy with HFrHF 09/27/2023 coronary calcium  score of 0 01/06/2024 echo EF 40 to 45% no aortic stenosis Follows with Dr. Lavonne Prairie last seen in the office 01/11/2024 Pending amyloid myocardial imaging No SOB, no CP  I have reviewed the clinic note as outlined by Santina Cull, PA and agree with the assessment, plan and medical decision making.  Ms. Calef presents to the office with a change in bowel habits, bloating, abdominal cramping and tenderness.  Endorses symptoms of difficulty evacuating bowel movements.  Stools vary in consistency and can be softer very hard.  Notes change in symptoms with a variety of different foods.  I agree with laboratory workup, CTAP and endoscopic evaluation.  Has had minimally elevated ALT -no fatty liver on ultrasound imaging.  Reasonable to continue monitoring LFTs.  Eugenia Hess, MD  Patient Care Team: Noreene Bearded, PA as PCP - General (Family Medicine) Eilleen Grates, MD as PCP - Cardiology (Cardiology) Artemisa Bile, MD (Inactive) as Referring Physician (Obstetrics  and Gynecology) Ob/Gyn, [facility] (Obstetrics and Gynecology)  HISTORY OF PRESENT ILLNESS: 63 y.o. female with a past medical history listed below presents for evaluation of AB pain/change in stools.   Discussed the use of AI scribe software for clinical note transcription with the patient, who gave verbal consent to proceed.  History of Present Illness   ARIYANNA OIEN is a 63 year old female with  inflammatory bowel disease who presents with changes in bowel habits and abdominal discomfort.  Since mid-September, she has experienced changes in her bowel habits, including lower abdominal pressure and the sensation of needing to have a bowel movement without the actual passage of stool. Bowel movements can take 15-20 minutes and vary in consistency from soft serve to formed, but not hard or compacted. She has oily, shiny stools occasionally and had a significant bowel movement on April 28th after taking Miralax on April 26th. Bowel movements mainly occur in the morning but can also happen in the middle of the day or at night, with a frequency of one to four times a day. She sometimes feels incomplete evacuation.  She manages her symptoms with dietary changes, initially adopting a rice and meat diet, which settled her stomach but did not alter stool consistency. She has since added cooked vegetables and avoids irritants like cucumbers. Watermelon causes mild heartburn. She skips breakfast if she needs to go out in the morning, consuming only a small amount of peanut butter to take her heart medication.  She experiences pressure and discomfort in the upper abdomen, particularly around the pancreas, which can radiate to the left side. This is described as a feeling of 'something's bulging' in her upper abdomen. She has a history of erosions in her lower stomach and was previously prescribed dicyclomine  to aid digestion.  No nausea, vomiting, or significant heartburn, but she reports occasional joint pain in the knees and hips. She has a history of mitral valve prolapse and takes antibiotics for dental procedures. She notes a change in urinary urgency, with less urgency in the mornings compared to ten years ago, but no significant urinary symptoms otherwise.        She  reports that she has been smoking cigarettes. She has a 10 pack-year smoking history. She has been exposed to tobacco smoke. She has  never used smokeless tobacco. She reports current alcohol use. She reports that she does not use drugs.  RELEVANT GI HISTORY, IMAGING AND LABS: Results   LABS Liver function tests: Elevated (03/08/2024)  RADIOLOGY CT abdomen and pelvis: Constipation (08/2018)      CBC    Component Value Date/Time   WBC 4.3 03/08/2024 0932   WBC 5.0 09/08/2018 1511   RBC 4.05 03/08/2024 0932   RBC 4.21 09/08/2018 1511   HGB 13.0 03/08/2024 0932   HCT 39.7 03/08/2024 0932   PLT 225 03/08/2024 0932   MCV 98 (H) 03/08/2024 0932   MCH 32.1 03/08/2024 0932   MCH 31.4 09/08/2018 1511   MCHC 32.7 03/08/2024 0932   MCHC 33.0 09/08/2018 1511   RDW 13.0 03/08/2024 0932   LYMPHSABS 1.5 03/08/2024 0932   MONOABS 0.4 09/08/2018 1511   EOSABS 0.2 03/08/2024 0932   BASOSABS 0.0 03/08/2024 0932   Recent Labs    07/13/23 1037 12/14/23 1213 03/08/24 0932  HGB 13.1 12.5 13.0    CMP     Component Value Date/Time   NA 143 03/08/2024 0932   K 4.2 03/08/2024 0932   CL 106 03/08/2024 0932  CO2 24 03/08/2024 0932   GLUCOSE 92 03/08/2024 0932   GLUCOSE 92 09/15/2018 1128   BUN 16 03/08/2024 0932   CREATININE 0.69 03/08/2024 0932   CALCIUM  9.5 03/08/2024 0932   PROT 6.7 03/08/2024 0932   ALBUMIN 4.3 03/08/2024 0932   AST 34 03/08/2024 0932   ALT 40 (H) 03/08/2024 0932   ALKPHOS 89 03/08/2024 0932   BILITOT 0.3 03/08/2024 0932   GFRNONAA 86 06/12/2020 0848   GFRAA 99 06/12/2020 0848      Latest Ref Rng & Units 03/08/2024    9:32 AM 01/27/2024   10:35 AM 01/27/2024    9:54 AM  Hepatic Function  Total Protein 6.0 - 8.5 g/dL 6.7  6.8  CANCELED   Albumin 3.9 - 4.9 g/dL 4.3     AST 0 - 40 IU/L 34     ALT 0 - 32 IU/L 40     Alk Phosphatase 44 - 121 IU/L 89     Total Bilirubin 0.0 - 1.2 mg/dL 0.3         Current Medications:   Current Outpatient Medications (Endocrine & Metabolic):    empagliflozin  (JARDIANCE ) 10 MG TABS tablet, Take 1 tablet (10 mg total) by mouth daily before  breakfast.  Current Outpatient Medications (Cardiovascular):    atorvastatin  (LIPITOR) 20 MG tablet, TAKE 1 TABLET BY MOUTH DAILY   carvedilol  (COREG ) 3.125 MG tablet, Take 1 tablet (3.125 mg total) by mouth 2 (two) times daily.   sacubitril -valsartan  (ENTRESTO ) 24-26 MG, Take 1 tablet by mouth 2 (two) times daily.  Current Outpatient Medications (Respiratory):    fluticasone  (FLONASE ) 50 MCG/ACT nasal spray, Place 1 spray into both nostrils 2 (two) times daily as needed for allergies or rhinitis.  Current Outpatient Medications (Analgesics):    ASPIRIN 81 PO, Take by mouth.   Current Outpatient Medications (Other):    busPIRone  (BUSPAR ) 10 MG tablet, Take 1 tablet (10 mg total) by mouth 2 (two) times daily.   Cholecalciferol (VITAMIN D3) 2000 units TABS, Take 1 tablet by mouth daily.   citalopram  (CELEXA ) 40 MG tablet, Take 1 tablet (40 mg total) by mouth daily.   clonazePAM  (KLONOPIN ) 0.5 MG tablet, Take 1 tablet (0.5 mg total) by mouth at bedtime as needed for anxiety.   cyclobenzaprine  (FLEXERIL ) 10 MG tablet, Take 1 tablet (10 mg total) by mouth 3 (three) times daily as needed for muscle spasms.   dicyclomine  (BENTYL ) 20 MG tablet, Take 1 tablet (20 mg total) by mouth 3 (three) times daily before meals.   famotidine  (PEPCID ) 20 MG tablet, Take 1 tablet (20 mg total) by mouth at bedtime.   hydrocortisone  (ANUSOL -HC) 2.5 % rectal cream, Place 1 Application rectally 2 (two) times daily.   Na Sulfate-K Sulfate-Mg Sulfate concentrate (SUPREP) 17.5-3.13-1.6 GM/177ML SOLN, Take 1 kit (354 mLs total) by mouth once for 1 dose.   pantoprazole  (PROTONIX ) 40 MG tablet, TAKE 1 TABLET BY MOUTH TWICE DAILY BEFORE A MEAL   Prenatal Vit-Fe Fumarate-FA (PRENATAL VITAMIN PO), Take 1 tablet by mouth daily.   TRIAMCINOLONE  ACETONIDE EX, 1 application Topical 2 times daily  Medical History:  Past Medical History:  Diagnosis Date   Anxiety    Cardiomyopathy (HCC)    Depression    Dyslipidemia    IBS  (irritable bowel syndrome)    Migraine headache    Mitral valve prolapse    Panic attacks    Rectal polyp    RLS (restless legs syndrome)    Allergies: No Known Allergies  Surgical History:  She  has a past surgical history that includes Rectal polypectomy. Family History:  Her family history includes Breast cancer in her maternal aunt; Cirrhosis in her father; Dementia in her mother; Heart failure in her father and mother; Hypertension in her father and mother; Other in her father; Stroke in her father and mother.  REVIEW OF SYSTEMS  : All other systems reviewed and negative except where noted in the History of Present Illness.  PHYSICAL EXAM: BP 100/70 (BP Location: Left Arm, Patient Position: Sitting, Cuff Size: Normal)   Pulse 72   Ht 5' 6.5" (1.689 m) Comment: height measured without shoes  Wt 147 lb 6 oz (66.8 kg)   LMP 11/26/2013   BMI 23.43 kg/m  Physical Exam   GENERAL APPEARANCE: Well nourished, in no apparent distress HEENT: No cervical lymphadenopathy, unremarkable thyroid , sclerae anicteric, conjunctiva pink RESPIRATORY: Respiratory effort normal, BS equal bilateral without rales, rhonchi, wheezing CARDIO: RRR with no MRGs, peripheral pulses intact ABDOMEN: Soft, non distended, active bowel sounds in all 4 quadrants, no tenderness to palpation, no rebound, no mass appreciated RECTAL: declines MUSCULOSKELETAL: Full ROM, normal gait, without edema SKIN: Dry, intact without rashes or lesions. No jaundice. NEURO: Alert, oriented, no focal deficits PSYCH: Cooperative, normal mood and affect.      Edmonia Gottron, PA-C 11:43 AM

## 2024-03-28 ENCOUNTER — Other Ambulatory Visit (INDEPENDENT_AMBULATORY_CARE_PROVIDER_SITE_OTHER)

## 2024-03-28 ENCOUNTER — Encounter: Payer: Self-pay | Admitting: Physician Assistant

## 2024-03-28 ENCOUNTER — Ambulatory Visit
Admission: RE | Admit: 2024-03-28 | Discharge: 2024-03-28 | Disposition: A | Discharge status: Home / Self Care | Source: Ambulatory Visit | Attending: Physician Assistant | Admitting: Physician Assistant

## 2024-03-28 ENCOUNTER — Ambulatory Visit (INDEPENDENT_AMBULATORY_CARE_PROVIDER_SITE_OTHER): Admitting: Physician Assistant

## 2024-03-28 VITALS — BP 100/70 | HR 72 | Ht 66.5 in | Wt 147.4 lb

## 2024-03-28 DIAGNOSIS — R195 Other fecal abnormalities: Secondary | ICD-10-CM | POA: Diagnosis not present

## 2024-03-28 DIAGNOSIS — I429 Cardiomyopathy, unspecified: Secondary | ICD-10-CM

## 2024-03-28 DIAGNOSIS — R140 Abdominal distension (gaseous): Secondary | ICD-10-CM

## 2024-03-28 DIAGNOSIS — I5020 Unspecified systolic (congestive) heart failure: Secondary | ICD-10-CM | POA: Diagnosis not present

## 2024-03-28 DIAGNOSIS — R197 Diarrhea, unspecified: Secondary | ICD-10-CM

## 2024-03-28 DIAGNOSIS — K589 Irritable bowel syndrome without diarrhea: Secondary | ICD-10-CM

## 2024-03-28 DIAGNOSIS — K219 Gastro-esophageal reflux disease without esophagitis: Secondary | ICD-10-CM

## 2024-03-28 DIAGNOSIS — R194 Change in bowel habit: Secondary | ICD-10-CM

## 2024-03-28 DIAGNOSIS — R7989 Other specified abnormal findings of blood chemistry: Secondary | ICD-10-CM

## 2024-03-28 DIAGNOSIS — R1012 Left upper quadrant pain: Secondary | ICD-10-CM

## 2024-03-28 DIAGNOSIS — R1032 Left lower quadrant pain: Secondary | ICD-10-CM

## 2024-03-28 LAB — CBC WITH DIFFERENTIAL/PLATELET
Basophils Absolute: 0 10*3/uL (ref 0.0–0.1)
Basophils Relative: 0.7 % (ref 0.0–3.0)
Eosinophils Absolute: 0.2 10*3/uL (ref 0.0–0.7)
Eosinophils Relative: 3.1 % (ref 0.0–5.0)
HCT: 43.1 % (ref 36.0–46.0)
Hemoglobin: 14.5 g/dL (ref 12.0–15.0)
Lymphocytes Relative: 29.7 % (ref 12.0–46.0)
Lymphs Abs: 2.1 10*3/uL (ref 0.7–4.0)
MCHC: 33.8 g/dL (ref 30.0–36.0)
MCV: 95.9 fl (ref 78.0–100.0)
Monocytes Absolute: 0.4 10*3/uL (ref 0.1–1.0)
Monocytes Relative: 6.3 % (ref 3.0–12.0)
Neutro Abs: 4.2 10*3/uL (ref 1.4–7.7)
Neutrophils Relative %: 60.2 % (ref 43.0–77.0)
Platelets: 230 10*3/uL (ref 150.0–400.0)
RBC: 4.49 Mil/uL (ref 3.87–5.11)
RDW: 14.3 % (ref 11.5–15.5)
WBC: 7 10*3/uL (ref 4.0–10.5)

## 2024-03-28 LAB — C-REACTIVE PROTEIN: CRP: <1 mg/dL (ref 0.5–20.0)

## 2024-03-28 LAB — COMPREHENSIVE METABOLIC PANEL WITH GFR
ALT: 41 U/L — ABNORMAL HIGH (ref 0–35)
AST: 29 U/L (ref 0–37)
Albumin: 4.6 g/dL (ref 3.5–5.2)
Alkaline Phosphatase: 76 U/L (ref 39–117)
BUN: 21 mg/dL (ref 6–23)
CO2: 29 meq/L (ref 19–32)
Calcium: 10.5 mg/dL (ref 8.4–10.5)
Chloride: 100 meq/L (ref 96–112)
Creatinine, Ser: 0.78 mg/dL (ref 0.40–1.20)
GFR: 80.31 mL/min (ref >60.00)
Glucose, Bld: 95 mg/dL (ref 70–99)
Potassium: 3.9 meq/L (ref 3.5–5.1)
Sodium: 136 meq/L (ref 135–145)
Total Bilirubin: 0.5 mg/dL (ref 0.2–1.2)
Total Protein: 7.8 g/dL (ref 6.0–8.3)

## 2024-03-28 LAB — TSH: TSH: 2.19 u[IU]/mL (ref 0.35–5.50)

## 2024-03-28 LAB — SEDIMENTATION RATE: Sed Rate: 16 mm/h (ref 0–30)

## 2024-03-28 MED ORDER — NA SULFATE-K SULFATE-MG SULF 17.5-3.13-1.6 GM/177ML PO SOLN: 1.0000 | Freq: Once | ORAL | 0 refills | Status: AC

## 2024-03-28 NOTE — Patient Instructions (Addendum)
 Your provider has requested that you go to the basement level for lab work before leaving today. Press "B" on the elevator. The lab is located at the first door on the left as you exit the elevator.  You will be contacted by [facility] Scheduling in the next 2 days to arrange a CT Abdomen/Pelvis.  The number on your caller ID will be [phone], please answer when they call.  If you have not heard from them in 2 days please call [phone] to schedule.  Your provider has requested that you have an abdominal x ray before leaving today. Please go to the basement floor to our Radiology department for the test.   First do a trial off milk/lactose products if you use them.  Add fiber like benefiber or citracel once a day Increase activity Can do trial of IBGard which is over the counter for AB pain- Take 1-2 capsules once a day for maintence or twice a day during a flare This is over the counter    FODMAP stands for fermentable oligo-, di-, mono-saccharides and polyols (1). These are the scientific terms used to classify groups of carbs that are difficult for our body to digest and that are notorious for triggering digestive symptoms like bloating, gas, loose stools and stomach pain.   You can try low FODMAP diet  - start with eliminating just one column at a time that you feel may be a trigger for you. - the table at the very bottom contains foods that are low in FODMAPs   Sometimes trying to eliminate the FODMAP's from your diet is difficult or tricky, if you are stuggling with trying to do the elimination diet you can try an enzyme.  There is a food enzymes that you sprinkle in or on your food that helps break down the FODMAP. You can read more about the enzyme by going to this site: https://fodzyme.com/   Here some information about pelvic floor dysfunction. This may be contributing to some of your symptoms. We will continue with our evaluation but I do want you to consider  adding on fiber supplement with low-dose MiraLAX daily. We could also refer to pelvic floor physical therapy.   Pelvic Floor Dysfunction, Female Pelvic floor dysfunction (PFD) is a condition that results when the group of muscles and connective tissues that support the organs in the pelvis (pelvic floor muscles) do not work well. These muscles and their connections form a sling that supports the colon and bladder. In women, they also support the uterus. PFD causes pelvic floor muscles to be too weak, too tight, or both. In PFD, muscle movements are not coordinated. This may cause bowel or bladder problems. It may also cause pain. What are the causes? This condition may be caused by an injury to the pelvic area or by a weakening of pelvic muscles. This often results from pregnancy and childbirth or other types of strain. In many cases, the exact cause is not known. What increases the risk? The following factors may make you more likely to develop this condition: Having chronic bladder tissue inflammation (interstitial cystitis). Being an older person. Being overweight. History of radiation treatment for cancer in the pelvic region. Previous pelvic surgery, such as removal of the uterus (hysterectomy). What are the signs or symptoms? Symptoms of this condition vary and may include: Bladder symptoms, such as: Trouble starting urination and emptying the bladder. Frequent urinary tract infections. Leaking urine when coughing, laughing, or exercising (stress incontinence). Having  to pass urine urgently or frequently. Pain when passing urine. Bowel symptoms, such as: Constipation. Urgent or frequent bowel movements. Incomplete bowel movements. Painful bowel movements. Leaking stool or gas. Unexplained genital or rectal pain. Genital or rectal muscle spasms. Low back pain. Other symptoms may include: A heavy, full, or aching feeling in the vagina. A bulge that protrudes into the  vagina. Pain during or after sex. How is this diagnosed? This condition may be diagnosed based on: Your symptoms and medical history. A physical exam. During the exam, your health care provider may check your pelvic muscles for tightness, spasm, pain, or weakness. This may include a rectal exam and a pelvic exam. In some cases, you may have diagnostic tests, such as: Electrical muscle function tests. Urine flow testing. X-ray tests of bowel function. Ultrasound of the pelvic organs. How is this treated? Treatment for this condition depends on the symptoms. Treatment options include: Physical therapy. This may include Kegel exercises to help relax or strengthen the pelvic floor muscles. Biofeedback. This type of therapy provides feedback on how tight your pelvic floor muscles are so that you can learn to control them. Internal or external massage therapy. A treatment that involves electrical stimulation of the pelvic floor muscles to help control pain (transcutaneous electrical nerve stimulation, or TENS). Sound wave therapy (ultrasound) to reduce muscle spasms. Medicines, such as: Muscle relaxants. Bladder control medicines. Surgery to reconstruct or support pelvic floor muscles may be an option if other treatments do not help. Follow these instructions at home: Activity Do your usual activities as told by your health care provider. Ask your health care provider if you should modify any activities. Do pelvic floor strengthening or relaxing exercises at home as told by your physical therapist. Lifestyle Maintain a healthy weight. Eat foods that are high in fiber, such as beans, whole grains, and fresh fruits and vegetables. Limit foods that are high in fat and processed sugars, such as fried or sweet foods. Manage stress with relaxation techniques such as yoga or meditation. General instructions If you have problems with leakage: Use absorbable pads or wear padded underwear. Wash  frequently with mild soap. Keep your genital and anal area as clean and dry as possible. Ask your health care provider if you should try a barrier cream to prevent skin irritation. Take warm baths to relieve pelvic muscle tension or spasms. Take over-the-counter and prescription medicines only as told by your health care provider. Keep all follow-up visits. How is this prevented? The cause of PFD is not always known, but there are a few things you can do to reduce the risk of developing this condition, including: Staying at a healthy weight. Getting regular exercise. Managing stress. Contact a health care provider if: Your symptoms are not improving with home care. You have signs or symptoms of PFD that get worse at home. You develop new signs or symptoms. You have signs of a urinary tract infection, such as: Fever. Chills. Increased urinary frequency. A burning feeling when urinating. You have not had a bowel movement in 3 days (constipation). Summary Pelvic floor dysfunction results when the muscles and connective tissues in your pelvic floor do not work well. These muscles and their connections form a sling that supports your colon and bladder. In women, they also support the uterus. PFD may be caused by an injury to the pelvic area or by a weakening of pelvic muscles. PFD causes pelvic floor muscles to be too weak, too tight, or a combination of  both. Symptoms may vary from person to person. In most cases, PFD can be treated with physical therapies and medicines. Surgery may be an option if other treatments do not help. This information is not intended to replace advice given to you by your health care provider. Make sure you discuss any questions you have with your health care provider. Document Revised: 02/19/2021 Document Reviewed: 02/19/2021 Elsevier Patient Education  2022 Elsevier Inc.   You will be contacted by [facility] Scheduling in the next 2 days to arrange a  CT Abdomen/Pelvis.  The number on your caller ID will be [phone], please answer when they call.  If you have not heard from them in 2 days please call [phone] to schedule.    Due to recent changes in healthcare laws, you may see the results of your imaging and laboratory studies on MyChart before your provider has had a chance to review them.  We understand that in some cases there may be results that are confusing or concerning to you. Not all laboratory results come back in the same time frame and the provider may be waiting for multiple results in order to interpret others.  Please give us  48 hours in order for your provider to thoroughly review all the results before contacting the office for clarification of your results.    You have been scheduled for a colonoscopy. Please follow written instructions given to you at your visit today.   If you use inhalers (even only as needed), please bring them with you on the day of your procedure.  DO NOT TAKE 7 DAYS PRIOR TO TEST- Trulicity (dulaglutide) Ozempic, Wegovy (semaglutide) Mounjaro (tirzepatide) Bydureon Bcise (exanatide extended release)  DO NOT TAKE 1 DAY PRIOR TO YOUR TEST Rybelsus (semaglutide) Adlyxin (lixisenatide) Victoza (liraglutide) Byetta (exanatide) ___________________________________________________________________________    I appreciate the  opportunity to care for you  Thank You   [facility]

## 2024-03-29 ENCOUNTER — Ambulatory Visit: Payer: Self-pay | Admitting: Physician Assistant

## 2024-03-29 DIAGNOSIS — K769 Liver disease, unspecified: Secondary | ICD-10-CM

## 2024-03-29 DIAGNOSIS — R160 Hepatomegaly, not elsewhere classified: Secondary | ICD-10-CM

## 2024-03-30 ENCOUNTER — Other Ambulatory Visit

## 2024-03-30 DIAGNOSIS — R197 Diarrhea, unspecified: Secondary | ICD-10-CM | POA: Diagnosis not present

## 2024-04-03 NOTE — Progress Notes (Unsigned)
 [facility] History and Physical   Primary Care Physician:  Noreene Bearded, PA   Reason for Procedure:  Change in bowel habits, abdominal pain, soft stools, bloating  Plan:    Colonoscopy     HPI: SHINIQUA GROSECLOSE is a 63 y.o. female undergoing colonoscopy for investigation of change in bowel habits with soft stools, abdominal pain and bloating.  Last colonoscopy was performed in 2016 and was normal.  EHR documents prior history of rectal polypectomy although a formal report related to this is not available.  No documented family history of colorectal cancer or polyps   Past Medical History:  Diagnosis Date   Anxiety    Cardiomyopathy (HCC)    Depression    Dyslipidemia    IBS (irritable bowel syndrome)    Migraine headache    Mitral valve prolapse    Panic attacks    Rectal polyp    RLS (restless legs syndrome)     Past Surgical History:  Procedure Laterality Date   RECTAL POLYPECTOMY      Prior to Admission medications   Medication Sig Start Date End Date Taking? Authorizing Provider  ASPIRIN 81 PO Take by mouth.    [provider]  atorvastatin  (LIPITOR) 20 MG tablet TAKE 1 TABLET BY MOUTH DAILY 02/22/24   Laneta Pintos, MD  busPIRone  (BUSPAR ) 10 MG tablet Take 1 tablet (10 mg total) by mouth 2 (two) times daily. 11/15/23   Noreene Bearded, PA  carvedilol  (COREG ) 3.125 MG tablet Take 1 tablet (3.125 mg total) by mouth 2 (two) times daily. 11/17/23 05/15/24  Eilleen Grates, MD  Cholecalciferol (VITAMIN D3) 2000 units TABS Take 1 tablet by mouth daily.    [provider]  citalopram  (CELEXA ) 40 MG tablet Take 1 tablet (40 mg total) by mouth daily. 11/15/23   Noreene Bearded, PA  clonazePAM  (KLONOPIN ) 0.5 MG tablet Take 1 tablet (0.5 mg total) by mouth at bedtime as needed for anxiety. 06/11/23   Laneta Pintos, MD  cyclobenzaprine  (FLEXERIL ) 10 MG tablet Take 1 tablet (10 mg total) by mouth 3 (three) times daily as needed for muscle  spasms. 01/06/23   Noreene Bearded, PA  dicyclomine  (BENTYL ) 20 MG tablet Take 1 tablet (20 mg total) by mouth 3 (three) times daily before meals. 11/15/23   Noreene Bearded, PA  empagliflozin  (JARDIANCE ) 10 MG TABS tablet Take 1 tablet (10 mg total) by mouth daily before breakfast. 01/11/24   Ava Boatman, NP  famotidine  (PEPCID ) 20 MG tablet Take 1 tablet (20 mg total) by mouth at bedtime. 01/06/23   Noreene Bearded, PA  fluticasone  (FLONASE ) 50 MCG/ACT nasal spray Place 1 spray into both nostrils 2 (two) times daily as needed for allergies or rhinitis.    [provider]  hydrocortisone  (ANUSOL -HC) 2.5 % rectal cream Place 1 Application rectally 2 (two) times daily. 12/15/23   Zehr, Jessica D, PA-C  pantoprazole  (PROTONIX ) 40 MG tablet TAKE 1 TABLET BY MOUTH TWICE DAILY BEFORE A MEAL 02/09/24   Laneta Pintos, MD  Prenatal Vit-Fe Fumarate-FA (PRENATAL VITAMIN PO) Take 1 tablet by mouth daily.    [provider]  sacubitril -valsartan  (ENTRESTO ) 24-26 MG Take 1 tablet by mouth 2 (two) times daily. 11/17/23   Eilleen Grates, MD  TRIAMCINOLONE  ACETONIDE EX 1 application Topical 2 times daily    [provider]    Current Outpatient Medications  Medication Sig Dispense Refill   ASPIRIN 81 PO Take by mouth.  atorvastatin  (LIPITOR) 20 MG tablet TAKE 1 TABLET BY MOUTH DAILY 90 tablet 1   busPIRone  (BUSPAR ) 10 MG tablet Take 1 tablet (10 mg total) by mouth 2 (two) times daily. 180 tablet 1   carvedilol  (COREG ) 3.125 MG tablet Take 1 tablet (3.125 mg total) by mouth 2 (two) times daily. 180 tablet 3   Cholecalciferol (VITAMIN D3) 2000 units TABS Take 1 tablet by mouth daily.     citalopram  (CELEXA ) 40 MG tablet Take 1 tablet (40 mg total) by mouth daily. 90 tablet 1   clonazePAM  (KLONOPIN ) 0.5 MG tablet Take 1 tablet (0.5 mg total) by mouth at bedtime as needed for anxiety. 30 tablet 0   dicyclomine  (BENTYL ) 20 MG tablet Take 1 tablet (20 mg total) by mouth 3  (three) times daily before meals. 270 tablet 1   pantoprazole  (PROTONIX ) 40 MG tablet TAKE 1 TABLET BY MOUTH TWICE DAILY BEFORE A MEAL 180 tablet 2   Prenatal Vit-Fe Fumarate-FA (PRENATAL VITAMIN PO) Take 1 tablet by mouth daily.     sacubitril -valsartan  (ENTRESTO ) 24-26 MG Take 1 tablet by mouth 2 (two) times daily. 180 tablet 3   cyclobenzaprine  (FLEXERIL ) 10 MG tablet Take 1 tablet (10 mg total) by mouth 3 (three) times daily as needed for muscle spasms. 30 tablet 0   empagliflozin  (JARDIANCE ) 10 MG TABS tablet Take 1 tablet (10 mg total) by mouth daily before breakfast. 90 tablet 1   famotidine  (PEPCID ) 20 MG tablet Take 1 tablet (20 mg total) by mouth at bedtime.     fluticasone  (FLONASE ) 50 MCG/ACT nasal spray Place 1 spray into both nostrils 2 (two) times daily as needed for allergies or rhinitis.     hydrocortisone  (ANUSOL -HC) 2.5 % rectal cream Place 1 Application rectally 2 (two) times daily. 30 g 1   TRIAMCINOLONE  ACETONIDE EX 1 application Topical 2 times daily     Current Facility-Administered Medications  Medication Dose Route Frequency Provider Last Rate Last Admin   0.9 %  sodium chloride  infusion  500 mL Intravenous Once Naim Murtha, Scarlette Currier, MD        Allergies as of 04/04/2024   (No Known Allergies)    Family History  Problem Relation Age of Onset   Hypertension Mother    Stroke Mother    Dementia Mother    Heart failure Mother        Later onset   Other Father        esophageal problems   Heart failure Father        No details   Cirrhosis Father        Non alcoholic    Stroke Father        Died in 04-24-2014    Hypertension Father    Breast cancer Maternal Aunt    Colon cancer Neg Hx     Social History   Socioeconomic History   Marital status: Married    Spouse name: Gorman Laughter   Number of children: 1   Years of education: Not on file   Highest education level: Not on file  Occupational History   Occupation: caregiver  Tobacco Use   Smoking status: Some Days     Current packs/day: 0.00    Average packs/day: 0.3 packs/day for 40.0 years (10.0 ttl pk-yrs)    Types: Cigarettes    Last attempt to quit: 04-25-2023    Years since quitting: 1.4    Passive exposure: Current   Smokeless tobacco: Never   Tobacco comments:  has cut back  Vaping Use   Vaping status: Never Used  Substance and Sexual Activity   Alcohol use: Yes    Comment: occ   Drug use: No   Sexual activity: Not Currently    Birth control/protection: None  Other Topics Concern   Not on file  Social History Narrative   Not employed    Married    One child    One grand      [place], walking with dog, she likes to go to [employer].    Social Drivers of Corporate investment banker Strain: Not on file  Food Insecurity: Not on file  Transportation Needs: Not on file  Physical Activity: Not on file  Stress: Not on file  Social Connections: Unknown (02/26/2023)   Received from [facility], [facility]   Social Network    Social Network: Not on file  Intimate Partner Violence: Unknown (02/26/2023)   Received from [facility], [facility]   HITS    Physically Hurt: Not on file    Insult or Talk Down To: Not on file    Threaten Physical Harm: Not on file    Scream or Curse: Not on file    Review of Systems:  All other review of systems negative except as mentioned in the HPI.  Physical Exam: Vital signs BP 128/74   Pulse 74   Temp (!) 97.3 F (36.3 C) (Temporal)   Resp 15   Ht 5' 6.5" (1.689 m)   Wt 147 lb (66.7 kg)   LMP 11/26/2013   SpO2 99%   BMI 23.37 kg/m   General:   Alert,  Well-developed, well-nourished, pleasant and cooperative in NAD Airway:  Mallampati 2 Lungs:  Clear throughout to auscultation.   Heart:  Regular rate and rhythm; no murmurs, clicks, rubs,  or gallops. Abdomen:  Soft, nontender and nondistended. Normal bowel sounds.   Neuro/Psych:  Normal mood and affect. A and O x 3  Eugenia Hess, MD [facility]  Gastroenterology

## 2024-04-04 ENCOUNTER — Encounter: Payer: Self-pay | Admitting: Pediatrics

## 2024-04-04 ENCOUNTER — Ambulatory Visit: Admitting: Pediatrics

## 2024-04-04 VITALS — BP 100/54 | HR 74 | Temp 97.3°F | Resp 10 | Ht 66.5 in | Wt 147.0 lb

## 2024-04-04 DIAGNOSIS — F32A Depression, unspecified: Secondary | ICD-10-CM | POA: Diagnosis not present

## 2024-04-04 DIAGNOSIS — K644 Residual hemorrhoidal skin tags: Secondary | ICD-10-CM | POA: Diagnosis not present

## 2024-04-04 DIAGNOSIS — R194 Change in bowel habit: Secondary | ICD-10-CM

## 2024-04-04 DIAGNOSIS — R197 Diarrhea, unspecified: Secondary | ICD-10-CM | POA: Diagnosis not present

## 2024-04-04 DIAGNOSIS — F419 Anxiety disorder, unspecified: Secondary | ICD-10-CM | POA: Diagnosis not present

## 2024-04-04 DIAGNOSIS — K648 Other hemorrhoids: Secondary | ICD-10-CM

## 2024-04-04 DIAGNOSIS — F410 Panic disorder [episodic paroxysmal anxiety] without agoraphobia: Secondary | ICD-10-CM | POA: Diagnosis not present

## 2024-04-04 DIAGNOSIS — G2581 Restless legs syndrome: Secondary | ICD-10-CM | POA: Diagnosis not present

## 2024-04-04 MED ORDER — SODIUM CHLORIDE 0.9 % IV SOLN: 500.0000 mL | Freq: Once | INTRAVENOUS | Status: DC

## 2024-04-04 NOTE — Op Note (Signed)
 [facility] Patient Name: Felicia Good Procedure Date: 04/04/2024 9:47 AM MRN: [id number] Endoscopist: Eugenia Hess , MD, [id number] Age: 63 Referring MD:  Date of Birth: 06/23/1961 Gender: Female Account #: [id number] Procedure:                Colonoscopy Indications:              Last colonoscopy: 2016, Abdominal pain, Change in                            bowel habits, Bloating Medicines:                Monitored Anesthesia Care Procedure:                Pre-Anesthesia Assessment:                           - Prior to the procedure, a History and Physical                            was performed, and patient medications and                            allergies were reviewed. The patient's tolerance of                            previous anesthesia was also reviewed. The risks                            and benefits of the procedure and the sedation                            options and risks were discussed with the patient.                            All questions were answered, and informed consent                            was obtained. Prior Anticoagulants: The patient has                            taken no anticoagulant or antiplatelet agents. ASA                            Grade Assessment: II - A patient with mild systemic                            disease. After reviewing the risks and benefits,                            the patient was deemed in satisfactory condition to                            undergo the procedure.  After obtaining informed consent, the colonoscope                            was passed under direct vision. Throughout the                            procedure, the patient's blood pressure, pulse, and                            oxygen saturations were monitored continuously. The                            Olympus Scope [id number] was introduced through the                            anus and advanced to the terminal  ileum. The                            colonoscopy was somewhat difficult due to                            restricted mobility of the colon. The patient                            tolerated the procedure well. The quality of the                            bowel preparation was good. The terminal ileum,                            ileocecal valve, appendiceal orifice, and rectum                            were photographed. Scope In: 9:53:13 AM Scope Out: 10:11:18 AM Scope Withdrawal Time: 0 hours 13 minutes 53 seconds  Total Procedure Duration: 0 hours 18 minutes 5 seconds  Findings:                 Skin tags were found on perianal exam.                           The digital rectal exam was normal. Pertinent                            negatives include normal sphincter tone and no                            palpable rectal lesions.                           Normal mucosa was found in the entire colon.                            Biopsies for histology were taken with a cold  forceps for evaluation of microscopic colitis.                           The terminal ileum appeared normal.                           Internal hemorrhoids were found during retroflexion. Complications:            No immediate complications. Estimated blood loss:                            Minimal. Estimated Blood Loss:     Estimated blood loss was minimal. Impression:               - Perianal skin tags found on perianal exam.                           - Normal mucosa in the entire examined colon.                            Biopsied.                           - The examined portion of the ileum was normal.                           - Internal hemorrhoids. Recommendation:           - Discharge patient to home (ambulatory).                           - Await pathology results.                           - Repeat colonoscopy in 10 years for screening                            purposes.                            - The findings and recommendations were discussed                            with the patient's family.                           - Return to GI clinic in 2 months.                           - Patient has a contact number available for                            emergencies. The signs and symptoms of potential                            delayed complications were discussed with the  patient. Return to normal activities tomorrow.                            Written discharge instructions were provided to the                            patient. Eugenia Hess, MD 04/04/2024 10:16:05 AM This report has been signed electronically.

## 2024-04-04 NOTE — Progress Notes (Signed)
 Called to room to assist during endoscopic procedure.  Patient ID and intended procedure confirmed with present staff. Received instructions for my participation in the procedure from the performing physician.

## 2024-04-04 NOTE — Progress Notes (Signed)
 To pacu, VSS. Report to [email or web address]

## 2024-04-04 NOTE — Patient Instructions (Addendum)
-  Handout on hermorrhoids provided -Repeat colonoscopy in 10 years recommended -await pathology results -Return to GI clinic in 2 months  YOU HAD AN ENDOSCOPIC PROCEDURE TODAY AT THE [facility]:   Refer to the procedure report that was given to you for any specific questions about what was found during the examination.  If the procedure report does not answer your questions, please call your gastroenterologist to clarify.  If you requested that your care partner not be given the details of your procedure findings, then the procedure report has been included in a sealed envelope for you to review at your convenience later.  YOU SHOULD EXPECT: Some feelings of bloating in the abdomen. Passage of more gas than usual.  Walking can help get rid of the air that was put into your GI tract during the procedure and reduce the bloating. If you had a lower endoscopy (such as a colonoscopy or flexible sigmoidoscopy) you may notice spotting of blood in your stool or on the toilet paper. If you underwent a bowel prep for your procedure, you may not have a normal bowel movement for a few days.  Please Note:  You might notice some irritation and congestion in your nose or some drainage.  This is from the oxygen used during your procedure.  There is no need for concern and it should clear up in a day or so.  SYMPTOMS TO REPORT IMMEDIATELY:  Following lower endoscopy (colonoscopy or flexible sigmoidoscopy):  Excessive amounts of blood in the stool  Significant tenderness or worsening of abdominal pains  Swelling of the abdomen that is new, acute  Fever of 100F or higher  For urgent or emergent issues, a gastroenterologist can be reached at any hour by calling [phone]. Do not use MyChart messaging for urgent concerns.    DIET:  We do recommend a small meal at first, but then you may proceed to your regular diet.  Drink plenty of fluids but you should avoid alcoholic beverages for 24  hours.  ACTIVITY:  You should plan to take it easy for the rest of today and you should NOT DRIVE or use heavy machinery until tomorrow (because of the sedation medicines used during the test).    FOLLOW UP: Our staff will call the number listed on your records the next business day following your procedure.  We will call around 7:15- 8:00 am to check on you and address any questions or concerns that you may have regarding the information given to you following your procedure. If we do not reach you, we will leave a message.     If any biopsies were taken you will be contacted by phone or by letter within the next 1-3 weeks.  Please call us  at [phone] if you have not heard about the biopsies in 3 weeks.    SIGNATURES/CONFIDENTIALITY: You and/or your care partner have signed paperwork which will be entered into your electronic medical record.  These signatures attest to the fact that that the information above on your After Visit Summary has been reviewed and is understood.  Full responsibility of the confidentiality of this discharge information lies with you and/or your care-partner.

## 2024-04-05 ENCOUNTER — Telehealth: Payer: Self-pay | Admitting: *Deleted

## 2024-04-05 LAB — FECAL FAT, QUALITATIVE
Fat Qual Neutral, Stl: NORMAL
Fat Qual Total, Stl: NORMAL

## 2024-04-05 NOTE — Telephone Encounter (Signed)
  Follow up Call-     04/04/2024    8:38 AM  Call back number  Post procedure Call Back phone  # [phone]  Permission to leave phone message Yes     Patient questions:  Do you have a fever, pain , or abdominal swelling? No. Pain Score  0 *  Have you tolerated food without any problems? Yes.    Have you been able to return to your normal activities? Yes.    Do you have any questions about your discharge instructions: Diet   No. Medications  No. Follow up visit  No.  Do you have questions or concerns about your Care? No.  Actions: * If pain score is 4 or above: No action needed, pain <4.

## 2024-04-06 ENCOUNTER — Ambulatory Visit: Admitting: Family Medicine

## 2024-04-06 ENCOUNTER — Ambulatory Visit (HOSPITAL_COMMUNITY)
Admission: RE | Admit: 2024-04-06 | Discharge: 2024-04-06 | Disposition: A | Discharge status: Home / Self Care | Source: Ambulatory Visit | Attending: Physician Assistant | Admitting: Physician Assistant

## 2024-04-06 DIAGNOSIS — K7689 Other specified diseases of liver: Secondary | ICD-10-CM | POA: Diagnosis not present

## 2024-04-06 DIAGNOSIS — R1032 Left lower quadrant pain: Secondary | ICD-10-CM | POA: Insufficient documentation

## 2024-04-06 DIAGNOSIS — R932 Abnormal findings on diagnostic imaging of liver and biliary tract: Secondary | ICD-10-CM | POA: Diagnosis not present

## 2024-04-06 DIAGNOSIS — R160 Hepatomegaly, not elsewhere classified: Secondary | ICD-10-CM | POA: Diagnosis not present

## 2024-04-06 LAB — C. DIFFICILE GDH AND TOXIN A/B
GDH ANTIGEN: NOT DETECTED
MICRO NUMBER:: 16543815
SPECIMEN QUALITY:: ADEQUATE
TOXIN A AND B: NOT DETECTED

## 2024-04-06 LAB — SURGICAL PATHOLOGY

## 2024-04-06 LAB — PANCREATIC ELASTASE, FECAL: Pancreatic Elastase-1, Stool: >800 ug/g (ref >200)

## 2024-04-06 MED ORDER — SODIUM CHLORIDE (PF) 0.9 % IJ SOLN
INTRAMUSCULAR | Status: AC
  Filled 2024-04-06: qty 50

## 2024-04-06 MED ORDER — IOHEXOL 300 MG/ML  SOLN
100.0000 mL | Freq: Once | INTRAMUSCULAR | Status: AC | PRN
  Administered 2024-04-06: 100 mL via INTRAVENOUS

## 2024-04-07 ENCOUNTER — Ambulatory Visit (INDEPENDENT_AMBULATORY_CARE_PROVIDER_SITE_OTHER)

## 2024-04-07 DIAGNOSIS — F32A Depression, unspecified: Secondary | ICD-10-CM | POA: Diagnosis not present

## 2024-04-07 DIAGNOSIS — F419 Anxiety disorder, unspecified: Secondary | ICD-10-CM | POA: Diagnosis not present

## 2024-04-07 DIAGNOSIS — K589 Irritable bowel syndrome without diarrhea: Secondary | ICD-10-CM | POA: Diagnosis not present

## 2024-04-07 DIAGNOSIS — E785 Hyperlipidemia, unspecified: Secondary | ICD-10-CM

## 2024-04-07 DIAGNOSIS — F410 Panic disorder [episodic paroxysmal anxiety] without agoraphobia: Secondary | ICD-10-CM | POA: Diagnosis not present

## 2024-04-07 DIAGNOSIS — K219 Gastro-esophageal reflux disease without esophagitis: Secondary | ICD-10-CM

## 2024-04-07 MED ORDER — CITALOPRAM HYDROBROMIDE 40 MG PO TABS: 40.0000 mg | ORAL_TABLET | Freq: Every day | ORAL | 1 refills | Status: DC

## 2024-04-07 MED ORDER — FAMOTIDINE 20 MG PO TABS: 20.0000 mg | ORAL_TABLET | Freq: Every day | ORAL | Status: DC

## 2024-04-07 MED ORDER — ATORVASTATIN CALCIUM 20 MG PO TABS: 20.0000 mg | ORAL_TABLET | Freq: Every day | ORAL | 1 refills | Status: DC

## 2024-04-07 MED ORDER — BUSPIRONE HCL 10 MG PO TABS
10.0000 mg | ORAL_TABLET | Freq: Two times a day (BID) | ORAL | 1 refills | Status: DC
Start: 2024-04-07 — End: 2024-07-18

## 2024-04-07 MED ORDER — CLONAZEPAM 0.5 MG PO TABS: 0.5000 mg | ORAL_TABLET | Freq: Every evening | ORAL | 0 refills | Status: DC | PRN

## 2024-04-07 MED ORDER — DICYCLOMINE HCL 20 MG PO TABS: 20.0000 mg | ORAL_TABLET | Freq: Three times a day (TID) | ORAL | 1 refills | Status: DC

## 2024-04-07 MED ORDER — PANTOPRAZOLE SODIUM 40 MG PO TBEC: 40.0000 mg | DELAYED_RELEASE_TABLET | Freq: Two times a day (BID) | ORAL | 2 refills | Status: DC

## 2024-04-07 NOTE — Patient Instructions (Signed)
 It was nice to see you today!  As we discussed in clinic:  -I have sent your refills to [facility]  -Continue celexa , buspar , and clonazepam  as needed for anxiety  -continue your atorvastatin  20 mg daily   -I will plan to see you in 6 months for follow up with fasting labs the week before!   If you have any problems before your next visit feel free to message me via MyChart (minor issues or questions) or call the office, otherwise you may reach out to schedule an office visit.  Thank you! Meryl Acosta, PA-C

## 2024-04-07 NOTE — Assessment & Plan Note (Signed)
 Stable.  Continue Celexa  40 mg, buspirone  10 mg up to twice daily, clonazepam  0.5 mg as needed for breakthrough anxiety.  Will continue to monitor.  Follow-up in 6 months.

## 2024-04-07 NOTE — Progress Notes (Signed)
 Established Patient Office Visit  Subjective   Patient ID: Felicia Good, female    DOB: September 26, 1961  Age: 63 y.o. MRN: [id number]  Chief Complaint  Patient presents with   Medical Management of Chronic Issues    HPI  Felicia Good is a 62 y.o. female who presents to the clinic today for follow up on mood, HLD.   Mood: Patient is here to follow up for depression and anxiety, currently managing with Celexa  40 mg daily, buspirone  10 mg twice daily, clonazepam  as needed for breakthrough anxiety.  She rarely needs to take the clonazepam , she typically only takes it when she needs to take a long car ride.  Taking medication without side effects, reports excellent compliance with treatment. Denies mood changes or SI/HI. She feels mood is stable since last visit. Does report that her granddaughter is having open heart surgery in 1 week, so she will be traveling to [facility] to stay in the hospital with her for a week. Reports this has caused increased anxiety and is requesting refill on her medications.  HLD: Currently on atorvastatin  20 mg daily. Reports excellent compliance with this medication and is tolerating well. Reports she has recently started a low FOD-MAP diet per the recommendations of her GI specialist. Denies side effects/myalgias.    ROS Per HPI.    Objective:     BP 90/61 (BP Location: Right Arm, Patient Position: Sitting, Cuff Size: Normal)   Pulse 70   Ht 5' 6.5 (1.689 m)   Wt 145 lb (65.8 kg)   LMP 11/26/2013   SpO2 99%   BMI 23.05 kg/m    Physical Exam Constitutional:      General: She is not in acute distress.    Appearance: Normal appearance.   Cardiovascular:     Rate and Rhythm: Normal rate and regular rhythm.     Heart sounds: Normal heart sounds. No murmur heard.    No friction rub. No gallop.  Pulmonary:     Effort: Pulmonary effort is normal. No respiratory distress.     Breath sounds: Normal breath sounds.   Musculoskeletal:        General: No  swelling.   Skin:    General: Skin is warm and dry.   Neurological:     General: No focal deficit present.     Mental Status: She is alert.   Psychiatric:        Mood and Affect: Mood normal.        Behavior: Behavior normal.        Thought Content: Thought content normal.      No results found for any visits on 04/07/24.  Last CBC Lab Results  Component Value Date   WBC 7.0 03/28/2024   HGB 14.5 03/28/2024   HCT 43.1 03/28/2024   MCV 95.9 03/28/2024   MCH 32.1 03/08/2024   RDW 14.3 03/28/2024   PLT 230.0 03/28/2024   Last metabolic panel Lab Results  Component Value Date   GLUCOSE 95 03/28/2024   NA 136 03/28/2024   K 3.9 03/28/2024   CL 100 03/28/2024   CO2 29 03/28/2024   BUN 21 03/28/2024   CREATININE 0.78 03/28/2024   GFR 80.31 03/28/2024   CALCIUM  10.5 03/28/2024   PROT 7.8 03/28/2024   ALBUMIN 4.6 03/28/2024   LABGLOB 2.4 03/08/2024   AGRATIO 1.7 01/13/2023   BILITOT 0.5 03/28/2024   ALKPHOS 76 03/28/2024   AST 29 03/28/2024   ALT 41 (H) 03/28/2024  ANIONGAP 9 09/08/2018   Last lipids Lab Results  Component Value Date   CHOL 200 (H) 03/08/2024   HDL 58 03/08/2024   LDLCALC 124 (H) 03/08/2024   LDLDIRECT 128.3 11/15/2012   TRIG 99 03/08/2024   CHOLHDL 3.4 03/08/2024   Last hemoglobin A1c Lab Results  Component Value Date   HGBA1C 5.6 03/08/2024      The 10-year ASCVD risk score (Arnett DK, et al., 2019) is: 4.7%    Assessment & Plan:   Hyperlipidemia, unspecified hyperlipidemia type Assessment & Plan: Last lipid panel: LDL 124, HDL 58, triglycerides 99.  Slightly elevated from her lipid panel 1 year ago.  CMP showed slightly elevated ALT at 41.  Due to this, I am hesitant to increase atorvastatin  at this time.  Patient has also started on a low FODMAP diet, which should help improve cholesterol over the next Avril months.  Will continue atorvastatin  20 mg for now.  Will recheck lipid panel in 6 months and discuss adding Zetia    Orders: -     Atorvastatin  Calcium ; Take 1 tablet (20 mg total) by mouth daily.  Dispense: 90 tablet; Refill: 1  Anxiety Assessment & Plan: Stable.  Continue Celexa  40 mg, buspirone  10 mg up to twice daily, clonazepam  0.5 mg as needed for breakthrough anxiety.  Will continue to monitor.  Follow-up in 6 months.  Orders: -     busPIRone  HCl; Take 1 tablet (10 mg total) by mouth 2 (two) times daily.  Dispense: 68 each; Refill: 1 -     Citalopram  Hydrobromide; Take 1 tablet (40 mg total) by mouth daily.  Dispense: 90 tablet; Refill: 1  Depression, unspecified depression type -     Citalopram  Hydrobromide; Take 1 tablet (40 mg total) by mouth daily.  Dispense: 90 tablet; Refill: 1  PANIC DISORDER,NO AGORAPHOBIA Assessment & Plan: PDMP reviewed, no aberrancies.  Refill of clonazepam  0.5 mg tablets sent in today.  Orders: -     clonazePAM ; Take 1 tablet (0.5 mg total) by mouth at bedtime as needed for anxiety.  Dispense: 30 tablet; Refill: 0  Irritable bowel syndrome, unspecified type Assessment & Plan: Refill sent in for Bentyl  20 mg 3 times a day, famotidine  20 mg once a day, pantoprazole  40 mg twice a day.  Recommend patient continue with low FODMAP diet and other recommendations per her GI specialist.  Orders: -     Dicyclomine  HCl; Take 1 tablet (20 mg total) by mouth 3 (three) times daily before meals.  Dispense: 102 each; Refill: 1 -     Famotidine ; Take 1 tablet (20 mg total) by mouth at bedtime. -     Pantoprazole  Sodium; Take 1 tablet (40 mg total) by mouth 2 (two) times daily before a meal.  Dispense: 180 tablet; Refill: 2  Gastroesophageal reflux disease, unspecified whether esophagitis present -     Famotidine ; Take 1 tablet (20 mg total) by mouth at bedtime. -     Pantoprazole  Sodium; Take 1 tablet (40 mg total) by mouth 2 (two) times daily before a meal.  Dispense: 180 tablet; Refill: 2    Return in about 6 months (around 10/07/2024) for HLD, Mood.    Felicia Bennett,  PA-C

## 2024-04-07 NOTE — Assessment & Plan Note (Addendum)
 Refill sent in for Bentyl  20 mg 3 times a day, famotidine  20 mg once a day, pantoprazole  40 mg twice a day.  Recommend patient continue with low FODMAP diet and other recommendations per her GI specialist.

## 2024-04-07 NOTE — Assessment & Plan Note (Signed)
 Last lipid panel: LDL 124, HDL 58, triglycerides 99.  Slightly elevated from her lipid panel 1 year ago.  CMP showed slightly elevated ALT at 41.  Due to this, I am hesitant to increase atorvastatin  at this time.  Patient has also started on a low FODMAP diet, which should help improve cholesterol over the next Avril months.  Will continue atorvastatin  20 mg for now.  Will recheck lipid panel in 6 months and discuss adding Zetia

## 2024-04-07 NOTE — Assessment & Plan Note (Signed)
PDMP reviewed, no aberrancies.  Refill of clonazepam 0.5 mg tablets sent in today.

## 2024-04-08 ENCOUNTER — Ambulatory Visit: Payer: Self-pay | Admitting: Pediatrics

## 2024-04-10 ENCOUNTER — Ambulatory Visit: Attending: Emergency Medicine | Admitting: Emergency Medicine

## 2024-04-10 ENCOUNTER — Encounter: Payer: Self-pay | Admitting: Emergency Medicine

## 2024-04-10 VITALS — BP 118/60 | HR 64 | Ht 60.0 in | Wt 145.4 lb

## 2024-04-10 DIAGNOSIS — I428 Other cardiomyopathies: Secondary | ICD-10-CM | POA: Insufficient documentation

## 2024-04-10 DIAGNOSIS — E782 Mixed hyperlipidemia: Secondary | ICD-10-CM | POA: Diagnosis present

## 2024-04-10 DIAGNOSIS — I5022 Chronic systolic (congestive) heart failure: Secondary | ICD-10-CM | POA: Diagnosis not present

## 2024-04-10 DIAGNOSIS — F17200 Nicotine dependence, unspecified, uncomplicated: Secondary | ICD-10-CM | POA: Diagnosis not present

## 2024-04-10 NOTE — Patient Instructions (Addendum)
 Medication Instructions:  NO CHANGES   Lab Work: NONE   Testing/Procedures: GET MYOCARDIAL AMYLOID IMAGING PLANAR AND SPECT SCHEDULED.  Follow-Up: At [facility], you and your health needs are our priority.  As part of our continuing mission to provide you with exceptional heart care, our providers are all part of one team.  This team includes your primary Cardiologist (physician) and Advanced Practice Providers or APPs (Physician Assistants and Nurse Practitioners) who all work together to provide you with the care you need, when you need it.  Your next appointment:   6 MONTHS  Provider:   Eilleen Grates, MD OR [facility] [place], [place]

## 2024-04-10 NOTE — Progress Notes (Signed)
 Cardiology Office Note:    Date:  04/10/2024  ID:  Felicia Good, DOB 1961-07-15, MRN [id number] PCP: Melene Sportsman  [facility] Providers Cardiologist:  Eilleen Grates, MD       Patient Profile:       Chief Complaint: Follow-up for ischemic cardiomyopathy History of Present Illness:  Felicia Good is a 63 y.o. female with visit-pertinent history of ischemic cardiomyopathy, HFrEF, and mild mitral regurgitation, hyperlipidemia   She established with cardiology service on 09/09/2023 for evaluation of shortness of breath.  The shortness of breath started about a year and a half ago, she would notice it while walking up small [place] near her home.  Echocardiogram September 2024 showed LVEF 35-40%, global hypokinesis, mild MR and mild anterior leaflet prolapse.  Her cardiomyopathy was unclear at that time.  Her lisinopril  was discontinued and she was started on Entresto  24/26 mg twice daily.  Coronary CTA was ordered for ischemic evaluation and completed on 09/27/2023 showing coronary calcium  score of 0 and total plaque volume of 0 with no evidence of CAD.  She was seen in clinic on 10/28/2023 and doing well at the time.  She was started on carvedilol  3.125 mg twice daily.   She was seen in clinic on 12/14/2023. She reports feeling tired and worn out and attributes this to recent stress and personal circumstances.  GDMT titration was limited due to low blood pressure of 94/64.  Echocardiogram was repeated on 01/06/2024 showing LVEF 40-45%, LV with mildly decreased function, no RWMA, normal diastolic parameters, RV function and size normal, trivial MR, mild calcification of aortic valve.  Last seen in clinic on 01/11/2024.  She is doing well at the time without cardiovascular concerns.  Jardiance  10 mg was added to further optimize GDMT.  PYP scanning for amyloid was ordered and remains pending.   Discussed the use of AI scribe software for clinical note transcription with the patient, who  gave verbal consent to proceed.  History of Present Illness Felicia Good is a 63 year old female who presents for follow-up of her cardiac condition.  She experiences mild fatigue but reports overall improvement in symptoms with carvedilol , Jardiance , and Entresto . She maintains regular physical activity, including walking and using an exercise bike, which she attributes to her improvement. She denies chest pain, dyspnea, or significant peripheral edema, though she had two episodes of transient 'leaden' legs that resolved spontaneously after performing Kegel exercises. Her last echocardiogram in March showed an ejection fraction of 40-45%. She is awaiting an amyloid scan.  She takes atorvastatin  for cholesterol management, but her LDL cholesterol has increased despite dietary efforts. She consumes red meat once a week, uses olive oil, and avoids butter, mayonnaise, and dressing.  She continues to smoke, currently at half her previous amount, with plans to reduce further after her granddaughter's heart surgery this week.  She denies any syncope, presyncope, orthopnea, PND, lightheadedness, dizziness.  Review of systems:  Please see the history of present illness. All other systems are reviewed and otherwise negative.      Studies Reviewed:        Echocardiogram 01/06/2024 1. Left ventricular ejection fraction, by estimation, is 40 to 45%. The  left ventricle has mildly decreased function. The left ventricle has no  regional wall motion abnormalities. Left ventricular diastolic parameters  were normal.   2. Right ventricular systolic function is normal. The right ventricular  size is normal.   3. The mitral valve is normal in  structure. Trivial mitral valve  regurgitation. No evidence of mitral stenosis.   4. The aortic valve is tricuspid. There is mild calcification of the  aortic valve. Aortic valve regurgitation is not visualized. No aortic  stenosis is present.   5. The inferior  vena cava is normal in size with greater than 50%  respiratory variability, suggesting right atrial pressure of 3 mmHg.   Coronary CTA 09/27/2023 1. Coronary calcium  score of 0. This was 0 percentile for age-, sex, and race-matched controls.   2. Total plaque volume 0 mm3 which is 0 percentile for age- and sex-matched controls (calcified plaque 0 mm3; non-calcified plaque 0 mm3). TPV is 0.   3. Normal coronary origin with right dominance.   4. Normal coronary arteries. Risk Assessment/Calculations:              Physical Exam:   VS:  BP 118/60   Pulse 64   Ht 5' (1.524 m)   Wt 145 lb 6.4 oz (66 kg)   LMP 11/26/2013   SpO2 98%   BMI 28.40 kg/m    Wt Readings from Last 3 Encounters:  04/10/24 145 lb 6.4 oz (66 kg)  04/07/24 145 lb (65.8 kg)  04/04/24 147 lb (66.7 kg)    GEN: Well nourished, well developed in no acute distress NECK: No JVD; No carotid bruits CARDIAC: RRR, no murmurs, rubs, gallops RESPIRATORY:  Clear to auscultation without rales, wheezing or rhonchi  ABDOMEN: Soft, non-tender, non-distended EXTREMITIES:  No edema; No acute deformity      Assessment and Plan:  HFmrEF / Nonischemic Cardiomyopathy The etiology of this is not clear at this time Echocardiogram 06/2023 w/ LVEF 35-40%, global hypokinesis Coronary CTA 09/2023 with normal coronary arteries and CAC of 0 Echocardiogram 12/2023 w/ LVEF 40-45%, LV mildly decreased function - Today she is euvolemic and well compensated on exam.  NYHA class I.   Weight remains stable.  Has had some weight loss with new diet.  Now exercising without limitation.  Remains overall asymptomatic besides mild fatigue.  No DOE, orthopnea, PND, edema. - Further GDMT optimization limited to low normal BP  - Continue Carvedilol  3.125 mg twice daily, Jardiance  10 mg daily, Entresto  24-26 mg twice daily - Currently pending PYP myocardial amyloid imaging - She has significant family history of HF which raises concern for genetic  predisposition   Mitral valve prolapse Echocardiogram 06/2023 with mild prolapse of anterior leaflet  Repeat echocardiogram shows the mitral valve is normal in structure with no evidence of MVP   Hyperlipidemia, LDL goal <100 LDL 124, HDL 58, TG 99, TC 200 on 02/2024 LDL above goal and was previously 91 on 12/2022 She is now on FODMAP diet and exercising regularly.  Will hold off on increasing atorvastatin  at this time and repeat fasting lipid panel at follow-up visit - Continue Atorvastatin  20 mg daily   Tobacco use  Long-time smoker, continues to smoke daily.  Action stage.  - Smoking cessation crucial for cardiovascular health - Encouraged continued efforts to quit smoking      Dispo:  Return in about 6 months (around 10/10/2024).  Signed, Ava Boatman, NP

## 2024-04-13 ENCOUNTER — Other Ambulatory Visit (HOSPITAL_COMMUNITY): Payer: Self-pay

## 2024-04-21 ENCOUNTER — Ambulatory Visit (HOSPITAL_COMMUNITY)

## 2024-04-26 ENCOUNTER — Ambulatory Visit (HOSPITAL_COMMUNITY)
Admission: RE | Admit: 2024-04-26 | Discharge: 2024-04-26 | Disposition: A | Discharge status: Home / Self Care | Source: Ambulatory Visit | Attending: Physician Assistant | Admitting: Physician Assistant

## 2024-04-26 DIAGNOSIS — K769 Liver disease, unspecified: Secondary | ICD-10-CM | POA: Insufficient documentation

## 2024-04-26 DIAGNOSIS — R932 Abnormal findings on diagnostic imaging of liver and biliary tract: Secondary | ICD-10-CM | POA: Diagnosis not present

## 2024-04-26 DIAGNOSIS — K7689 Other specified diseases of liver: Secondary | ICD-10-CM | POA: Diagnosis not present

## 2024-04-26 MED ORDER — GADOBUTROL 1 MMOL/ML IV SOLN: 6.5000 mL | Freq: Once | INTRAVENOUS | Status: DC | PRN

## 2024-04-26 MED ORDER — GADOXETATE DISODIUM 0.25 MMOL/ML IV SOLN
10.0000 mL | Freq: Once | INTRAVENOUS | Status: AC | PRN
  Administered 2024-04-26: 10 mL via INTRAVENOUS

## 2024-04-27 ENCOUNTER — Ambulatory Visit: Payer: Self-pay | Admitting: Physician Assistant

## 2024-05-15 ENCOUNTER — Other Ambulatory Visit: Payer: Self-pay

## 2024-05-15 ENCOUNTER — Other Ambulatory Visit (HOSPITAL_COMMUNITY): Payer: Self-pay

## 2024-05-16 ENCOUNTER — Other Ambulatory Visit (HOSPITAL_COMMUNITY): Payer: Self-pay

## 2024-05-16 ENCOUNTER — Telehealth (HOSPITAL_COMMUNITY): Payer: Self-pay

## 2024-05-16 NOTE — Telephone Encounter (Signed)
 Spoke with the patient, detailed instructions given. S.Icela Glymph CCT

## 2024-05-19 ENCOUNTER — Other Ambulatory Visit: Payer: Self-pay | Admitting: Emergency Medicine

## 2024-05-19 DIAGNOSIS — I5020 Unspecified systolic (congestive) heart failure: Secondary | ICD-10-CM

## 2024-05-23 ENCOUNTER — Ambulatory Visit (HOSPITAL_COMMUNITY): Admission: RE | Admit: 2024-05-23 | Source: Ambulatory Visit

## 2024-05-31 ENCOUNTER — Ambulatory Visit (HOSPITAL_COMMUNITY)
Admission: RE | Admit: 2024-05-31 | Discharge: 2024-05-31 | Disposition: A | Discharge status: Home / Self Care | Source: Ambulatory Visit | Attending: Cardiovascular Disease | Admitting: Cardiovascular Disease

## 2024-05-31 DIAGNOSIS — I5020 Unspecified systolic (congestive) heart failure: Secondary | ICD-10-CM

## 2024-05-31 MED ORDER — TECHNETIUM TC 99M PYROPHOSPHATE
21.8000 | Freq: Once | INTRAVENOUS | Status: AC
  Administered 2024-05-31: 21.8 via INTRAVENOUS

## 2024-06-01 ENCOUNTER — Ambulatory Visit: Payer: Self-pay | Admitting: Cardiology

## 2024-06-05 NOTE — Progress Notes (Signed)
 [facility] Return Visit   Referring Provider Wallace Joesph LABOR, PA [address] [place],  [place] [address]  Primary Care Provider Gayle Saddie FALCON, [place]  Patient Profile: Felicia Good is a 63 y.o. female with a past medical history noteworthy for cardiomyopathy (EF 40-45% 12/2023), mitral regurg/valve prolapse, chronic sinusitis, HLD, who returns to the [facility] for follow-up of the problem(s) noted below.  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 was last seen in the GI office 03/28/2024   Current GI Meds  Pantoprazole  40 mg orally daily Famotidine  20 mg p.o. twice daily as needed Dicyclomine  20 mg p.o. twice daily-3 times daily  Interval History   Discussed the use of AI scribe software for clinical note transcription with the patient, who gave verbal consent to proceed.  History of Present Illness IBS - Colonoscopy 03/2024 showed normal colon and terminal ileum and biopsies negative for microscopic colitis - Bowel regularity improved since previous bowel purge and colonoscopy, with one to two bowel movements daily without use of laxatives or stool softeners - Occasional 'wet gas' and urgency, possibly related to incomplete evacuation or dietary factors - No mention of constipation or diarrhea at this time - No formal pelvic floor physical therapy completed - Performs pelvic floor exercises found online -interested in a referral for pelvic floor physical therapy given sensation of incomplete evacuation  - Abdominal pressure present bilaterally, especially when lying on her side - Symptoms believed to be food-related, with dairy (especially in larger quantities and when combined with yogurt) as a significant trigger - Cautious with raw vegetables and processed foods; some are tolerated, others are not - Follows a FODMAP diet, with dairy identified as  a primary trigger  - Taking dicyclomine  20 mg p.o. twice daily with benefit  GERD - Occasional esophageal 'squeaky' sounds associated with certain foods, such as cornbread - Denies pyrosis, regurgitation, heartburn, dysphagia or dyne aphasia - Continues on pantoprazole  40 mg orally daily in conjunction with famotidine  as needed  Liver lesion and elevated ALT - Has had a longstanding history of mildly elevated ALT - CMP 03/2024 ALT 41, AST 29 - Previous workup performed by Dr. 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 Otherwise negative.  General Review of Systems  Review of systems is significant for the pertinent positives and negatives as listed per the HPI.  Full ROS is otherwise negative.  Past Medical History   Past Medical History:  Diagnosis Date   Anxiety    Cardiomyopathy (HCC)    Depression    Dyslipidemia    IBS (irritable bowel syndrome)    Migraine headache    Mitral valve prolapse    Panic attacks    Rectal polyp    RLS (restless legs syndrome)      Past Surgical History   Past Surgical History:  Procedure Laterality Date   RECTAL POLYPECTOMY       Allergies and Medications   No Known Allergies   Current Meds  Medication Sig   ASPIRIN 81 PO Take by mouth.   atorvastatin  (LIPITOR) 20 MG tablet Take 1 tablet (20 mg total) by mouth daily.   busPIRone  (BUSPAR ) 10 MG tablet Take 1 tablet (10 mg total) by mouth 2 (two) times daily.   carvedilol  (COREG ) 3.125 MG tablet Take 1 tablet (3.125 mg total) by mouth 2 (two) times daily.   Cholecalciferol (VITAMIN D3) 2000  units TABS Take 1 tablet by mouth daily.   citalopram  (CELEXA ) 40 MG tablet Take 1 tablet (40 mg total) by mouth daily.   clonazePAM  (KLONOPIN ) 0.5 MG tablet Take 1 tablet (0.5 mg total) by mouth at bedtime as needed for anxiety.   cyclobenzaprine  (FLEXERIL ) 10 MG tablet Take 1 tablet (10 mg total) by mouth 3 (three) times daily as needed for muscle spasms.   dicyclomine  (BENTYL ) 20 MG tablet Take 1 tablet (20 mg total) by mouth 3 (three) times daily before meals.   empagliflozin  (JARDIANCE ) 10 MG TABS tablet Take 1 tablet (10 mg total) by mouth daily before breakfast.   famotidine  (PEPCID ) 20 MG tablet Take 1 tablet (20 mg total) by mouth at bedtime.   fluticasone  (FLONASE ) 50 MCG/ACT nasal spray Place 1 spray into both nostrils 2 (two) times daily as needed for allergies or rhinitis.   hydrocortisone  (ANUSOL -HC) 2.5 % rectal cream Place 1 Application rectally 2 (two) times daily.   pantoprazole  (PROTONIX ) 40 MG tablet Take 1 tablet (40 mg total) by mouth 2 (two) times daily before a meal.   Prenatal Vit-Fe Fumarate-FA (PRENATAL VITAMIN PO) Take 1 tablet by mouth daily.   sacubitril -valsartan  (ENTRESTO ) 24-26 MG Take 1 tablet by mouth 2 (two) times daily.   TRIAMCINOLONE  ACETONIDE EX 1 application Topical 2 times daily     Family His   Family History  Problem Relation Age of Onset   Hypertension Mother    Stroke Mother    Dementia Mother    Heart failure Mother        Later onset   Other Father        esophageal problems   Heart failure Father        No details   Cirrhosis Father        Non alcoholic    Stroke Father        Died in 07-15-14    Hypertension Father    Breast cancer Maternal Aunt    Colon cancer Neg Hx     Social History   Social History   Tobacco Use   Smoking status: Some Days    Current packs/day: 0.00    Average packs/day: 0.3 packs/day for 40.0 years (10.0 ttl pk-yrs)    Types: Cigarettes    Last attempt to quit: 2023/07/16    Years since quitting: 1.6     Passive exposure: Current   Smokeless tobacco: Never   Tobacco comments:    has cut back  Vaping Use   Vaping status: Never Used  Substance Use Topics   Alcohol use: Yes    Comment: occ   Drug use: No   Jolyn reports that she has been smoking cigarettes. She has a 10 pack-year smoking history. She has been exposed to tobacco smoke. She has never used smokeless tobacco. She reports current alcohol use. She reports that she does not use drugs.  Vital Signs and Physical Examination   Vitals:   06/06/24 0846  BP: 104/64  Pulse: 63   Body mass index is 23.24 kg/m. Weight: 148 lb  6 oz (67.3 kg)  General: Well developed, well nourished, no acute distress Head: Normocephalic and atraumatic Eyes: Sclerae anicteric, EOMI Lungs: Clear throughout to auscultation Heart: Regular rate and rhythm; No murmurs, rubs or bruits Abdomen: Soft, non tender and non distended. No masses, hepatosplenomegaly or hernias noted. Normal Bowel sounds Rectal: Deferred Musculoskeletal: Symmetrical with no gross deformities     Review of Data   The following data was reviewed at the time of this encounter:   Laboratory Studies      Latest Ref Rng & Units 03/28/2024   11:45 AM 03/08/2024    9:32 AM 12/14/2023   12:13 PM  CBC  WBC 4.0 - 10.5 K/uL 7.0  4.3  5.4   Hemoglobin 12.0 - 15.0 g/dL 85.4  86.9  87.4   Hematocrit 36.0 - 46.0 % 43.1  39.7  37.5   Platelets 150.0 - 400.0 K/uL 230.0  225  213     Lab Results  Component Value Date   LIPASE 43 09/08/2018      Latest Ref Rng & Units 03/28/2024   11:45 AM 03/08/2024    9:32 AM 01/27/2024   10:35 AM  CMP  Glucose 70 - 99 mg/dL 95  92    BUN 6 - 23 mg/dL 21  16    Creatinine 9.59 - 1.20 mg/dL 9.21  9.30    Sodium 864 - 145 mEq/L 136  143    Potassium 3.5 - 5.1 mEq/L 3.9  4.2    Chloride 96 - 112 mEq/L 100  106    CO2 19 - 32 mEq/L 29  24    Calcium  8.4 - 10.5 mg/dL 89.4  9.5    Total Protein 6.0 - 8.3 g/dL 7.8  6.7  6.8   Total Bilirubin 0.2 -  1.2 mg/dL 0.5  0.3    Alkaline Phos 39 - 117 U/L 76  89    AST 0 - 37 U/L 29  34    ALT 0 - 35 U/L 41  40     Lab Results  Component Value Date   TSH 2.19 03/28/2024   Lab Results  Component Value Date   ESRSEDRATE 16 03/28/2024   Lab Results  Component Value Date   CRP <1.0 03/28/2024   Stool studies 03/2024 C. difficile, fecal fat and pancreatic elastase normal  Labs 11/2023 Hepatitis B surface antigen nonreactive Hepatitis B surface antibody reactive Hepatitis C antibody negative  Labs 09/09/2023 HIV negative  2019 Celiac panel negative Labs for autoimmune hepatitis and alpha-1 antitrypsin deficiency negative  Imaging Studies  MRI Abdomen 04/26/2024 1. Arterially hyperenhancing lesion of the superior left lobe of the liver, hepatic segment IVA, measuring 1.2 x 1.1 cm. Imaging characteristics are most consistent with a benign focal nodular hyperplasia, and in the absence of known malignancy or chronic high risk liver disease (cirrhosis, chronic viral hepatitis), no specific further follow-up or characterization is required.   2.  No acute findings in the abdomen.  CTAP 04/06/2024 No acute findings.   12 mm hypervascular mass in left hepatic lobe, which likely represents a benign etiology such as a flash-filling hemangioma, focal nodular hyperplasia, or adenoma. Recommend continued imaging follow-up in 6 months with abdomen MRI without and with contrast.  RUQ US  07/23/2023 FINDINGS: Gallbladder:   No gallstones or wall thickening visualized. No sonographic Murphy sign noted by sonographer.   Common bile duct:   Diameter: 3 mm   Liver:   There is a 1.7 cm septated cyst  within the right hepatic lobe. Normal parenchymal echogenicity. Portal vein is patent on color Doppler imaging with normal direction of blood flow towards the liver.   Other: None.   IMPRESSION: No cholelithiasis or sonographic evidence for acute cholecystitis.  HIDA  09/30/2018 Normal, EF 57%   CTAP 09/08/2018 1. Increased colonic stool burden consistent with constipation. No bowel obstruction or inflammation. 2. 11 mm cyst or hemangioma in the left hepatic lobe. 3. Mild prominence of periuterine vessels which may reflect stigmata of pelvic vascular congestion syndrome.  GI Procedures and Studies  Colonoscopy 03/2024 Normal colon and terminal ileum Path: Normal random colon biopsies-no microscopic colitis or IBD  EGD 10/2018 Multiple nonbleeding erosions in the gastric body Diffuse moderately erythematous mucosa in the duodenal bulb Path: Chronic inactive gastritis without H. pylori, ulcerative duodenitis  Colonoscopy 02/2015 Normal   Clinical Impression  It is my clinical impression that Ms. Lapp is a 63 y.o. 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  Since her last visit she underwent colonoscopy and laboratory testing for her symptoms of abdominal discomfort, alternating bowel habits and bloating.  Laboratory studies, CTAP and colonoscopy were overall normal.  Reviewed with her that I suspect she has a form of irritable bowel syndrome although SIBO may be in the differential diagnosis.  She has noted improvement in her bowel habits since undergoing bowel purge and cleanout with recent colonoscopy prep.  She is currently focusing on dietary modification with fiber and low FODMAP diet.  She has found benefit from the use of dicyclomine  for managing her abdominal discomfort.  She does continue to endorse a sensation of incomplete evacuation and is interested in being evaluated for pelvic floor physical therapy.  GERD symptoms appear relatively well-controlled on a combination of lifestyle modification/diet, pantoprazole  and famotidine .  We also reviewed her previous hepatology evaluation as she  has had a mildly elevated ALT.  Laboratory studies have ruled out forms of viral hepatitis, autoimmune hepatitis, alpha-1 antitrypsin deficiency and celiac disease.  Her imaging recently demonstrated benign FNH which does not require additional radiographic follow-up.  Her father had MASLD and suspect she may also carry this diagnosis.  Fib 4 calculated today shows low risk of fibrosis.  Plan  Continue dicyclomine  20 mg p.o. twice daily-3 times daily Referral placed for pelvic floor physical therapy Low FODMAP diet and dietary modification for IBS and GERD Continue pantoprazole  40 mg orally daily Continue famotidine  20 mg p.o. twice daily as needed GERD symptoms Monitor LFTs every 6 to 12 months No dedicated liver imaging required for [facility] but would be reasonable to update ultrasound every 2-3 years in the setting of suspected MASLD Next screening colonoscopy due 04/14/1934   Planned Follow Up Return in about 6 months (around 12/07/2024).  The patient or caregiver verbalized understanding of the material covered, with no barriers to understanding. All questions were answered. Patient or caregiver is agreeable with the plan outlined above.    It was a pleasure to see [facility].  If you have any questions or concerns regarding this evaluation, do not hesitate to contact me.  Inocente Hausen, MD [facility]  I spent total of 30 minutes in both face-to-face (20 minutes interview) and non-face-to-face (10 minutes chart review, care coordination, documentation)  activities, excluding procedures performed, for the visit on the date of this encounter.

## 2024-06-06 ENCOUNTER — Ambulatory Visit: Admitting: Pediatrics

## 2024-06-06 ENCOUNTER — Encounter: Payer: Self-pay | Admitting: Pediatrics

## 2024-06-06 VITALS — BP 104/64 | HR 63 | Ht 67.0 in | Wt 148.4 lb

## 2024-06-06 DIAGNOSIS — K219 Gastro-esophageal reflux disease without esophagitis: Secondary | ICD-10-CM | POA: Diagnosis not present

## 2024-06-06 DIAGNOSIS — K769 Liver disease, unspecified: Secondary | ICD-10-CM

## 2024-06-06 DIAGNOSIS — K581 Irritable bowel syndrome with constipation: Secondary | ICD-10-CM

## 2024-06-06 DIAGNOSIS — R7401 Elevation of levels of liver transaminase levels: Secondary | ICD-10-CM

## 2024-06-06 DIAGNOSIS — K589 Irritable bowel syndrome without diarrhea: Secondary | ICD-10-CM

## 2024-06-06 DIAGNOSIS — R7989 Other specified abnormal findings of blood chemistry: Secondary | ICD-10-CM

## 2024-06-06 NOTE — Patient Instructions (Signed)
 Referral placed for Pelvic Floor PT.   Follow up in 6 months or sooner if needed.  _______________________________________________________  If your blood pressure at your visit was 140/90 or greater, please contact your primary care physician to follow up on this.  _______________________________________________________  If you are age 64 or older, your body mass index should be between 23-30. Your Body mass index is 23.24 kg/m. If this is out of the aforementioned range listed, please consider follow up with your Primary Care Provider.  If you are age 4 or younger, your body mass index should be between 19-25. Your Body mass index is 23.24 kg/m. If this is out of the aformentioned range listed, please consider follow up with your Primary Care Provider.   ________________________________________________________  The [facility] providers would like to encourage you to use MYCHART to communicate with providers for non-urgent requests or questions.  Due to long hold times on the telephone, sending your provider a message by [facility] may be a faster and more efficient way to get a response.  Please allow 48 business hours for a response.  Please remember that this is for non-urgent requests.  _______________________________________________________  [facility] is using a team-based approach to care.  Your team is made up of your doctor and two to three APPS. Our APPS (Nurse Practitioners and Physician Assistants) work with your physician to ensure care continuity for you. They are fully qualified to address your health concerns and develop a treatment plan. They communicate directly with your gastroenterologist to care for you. Seeing the Advanced Practice Practitioners on your physician's team can help you by facilitating care more promptly, often allowing for earlier appointments, access to diagnostic testing, procedures, and other specialty referrals.

## 2024-06-13 ENCOUNTER — Other Ambulatory Visit: Payer: Self-pay

## 2024-06-13 DIAGNOSIS — R928 Other abnormal and inconclusive findings on diagnostic imaging of breast: Secondary | ICD-10-CM

## 2024-06-21 ENCOUNTER — Other Ambulatory Visit (HOSPITAL_COMMUNITY): Payer: Self-pay

## 2024-07-13 ENCOUNTER — Other Ambulatory Visit: Payer: Self-pay | Admitting: Emergency Medicine

## 2024-07-13 ENCOUNTER — Other Ambulatory Visit (HOSPITAL_COMMUNITY): Payer: Self-pay

## 2024-07-13 MED ORDER — EMPAGLIFLOZIN 10 MG PO TABS
10.0000 mg | ORAL_TABLET | Freq: Every day | ORAL | 3 refills | Status: AC
  Filled 2024-07-13 – 2024-07-14 (×2): qty 90, 90d supply, fill #0
  Filled 2024-10-16: qty 90, 90d supply, fill #1

## 2024-07-14 ENCOUNTER — Other Ambulatory Visit (HOSPITAL_COMMUNITY): Payer: Self-pay

## 2024-07-17 ENCOUNTER — Other Ambulatory Visit: Payer: Self-pay

## 2024-07-17 DIAGNOSIS — K589 Irritable bowel syndrome without diarrhea: Secondary | ICD-10-CM

## 2024-07-17 MED ORDER — DICYCLOMINE HCL 20 MG PO TABS
20.0000 mg | ORAL_TABLET | Freq: Three times a day (TID) | ORAL | 1 refills | Status: AC
Start: 2024-07-17 — End: ?

## 2024-07-17 NOTE — Telephone Encounter (Signed)
 Copied from CRM #[id number]. Topic: Clinical - Medication Refill >> Jul 17, 2024  9:05 AM Wess RAMAN wrote: Medication: dicyclomine  (BENTYL ) 20 MG tablet   Has the patient contacted their pharmacy? Yes (Agent: If no, request that the patient contact the pharmacy for the refill. If patient does not wish to contact the pharmacy document the reason why and proceed with request.) (Agent: If yes, when and what did the pharmacy advise?)  This is the patient's preferred pharmacy:   [facility] - [place], [place] - [address] [place] [address] Phone: [phone] Fax: [phone]  Is this the correct pharmacy for this prescription? Yes If no, delete pharmacy and type the correct one.   Has the prescription been filled recently? Yes  Is the patient out of the medication? No. Patient needs refill before leaving and going out of town.   Has the patient been seen for an appointment in the last year OR does the patient have an upcoming appointment? Yes  Can we respond through MyChart? Yes  Agent: Please be advised that Rx refills may take up to 3 business days. We ask that you follow-up with your pharmacy.

## 2024-07-18 ENCOUNTER — Other Ambulatory Visit: Payer: Self-pay

## 2024-07-18 DIAGNOSIS — F419 Anxiety disorder, unspecified: Secondary | ICD-10-CM

## 2024-07-23 ENCOUNTER — Other Ambulatory Visit: Payer: Self-pay

## 2024-08-07 DIAGNOSIS — N6011 Diffuse cystic mastopathy of right breast: Secondary | ICD-10-CM | POA: Diagnosis not present

## 2024-08-07 DIAGNOSIS — R92333 Mammographic heterogeneous density, bilateral breasts: Secondary | ICD-10-CM | POA: Diagnosis not present

## 2024-08-07 LAB — HM MAMMOGRAPHY

## 2024-08-14 ENCOUNTER — Other Ambulatory Visit (HOSPITAL_COMMUNITY): Payer: Self-pay

## 2024-08-15 ENCOUNTER — Other Ambulatory Visit (HOSPITAL_COMMUNITY): Payer: Self-pay

## 2024-08-15 ENCOUNTER — Telehealth: Payer: Self-pay | Admitting: Pharmacy Technician

## 2024-08-15 NOTE — Telephone Encounter (Signed)
   Patient needs to get brand with [email or web address]

## 2024-08-16 ENCOUNTER — Telehealth: Payer: Self-pay

## 2024-08-16 MED ORDER — ENTRESTO 24-26 MG PO TABS
1.0000 | ORAL_TABLET | Freq: Two times a day (BID) | ORAL | 3 refills | Status: DC
  Filled 2024-08-16: qty 180, 90d supply, fill #0
  Filled 2024-11-14: qty 180, 90d supply, fill #1

## 2024-08-16 NOTE — Therapy (Unsigned)
 OUTPATIENT PHYSICAL THERAPY FEMALE PELVIC EVALUATION   Patient Name: Felicia Good MRN: [id number] DOB:1961/08/02, 63 y.o., female Today's Date: 08/17/2024  END OF SESSION:  PT End of Session - 08/17/24 1305     Visit Number 1    Date for Recertification  02/15/25    Authorization Type [employer] mediccaid    PT Start Time 1300    PT Stop Time 1345    PT Time Calculation (min) 45 min    Activity Tolerance Patient tolerated treatment well    Behavior During Therapy WFL for tasks assessed/performed          Past Medical History:  Diagnosis Date   Anxiety    Cardiomyopathy (HCC)    Depression    Dyslipidemia    IBS (irritable bowel syndrome)    Migraine headache    Mitral valve prolapse    Panic attacks    Rectal polyp    RLS (restless legs syndrome)    Past Surgical History:  Procedure Laterality Date   RECTAL POLYPECTOMY     Patient Active Problem List   Diagnosis Date Noted   Change in bowel habits 12/15/2023   Diarrhea of presumed infectious origin 12/15/2023   Nonrheumatic mitral valve regurgitation 09/06/2023   Cardiomyopathy (HCC) 09/06/2023   Gastroesophageal reflux disease 08/07/2021   Irritable bowel syndrome 08/07/2021   Female stress incontinence 07/25/2021   Mitral valve prolapse 07/25/2021   H/O ulcer disease 05/08/2019   Brown hairy tongue 07/30/2017   Elevated liver function tests 07/15/2017   Hyperlipidemia 07/15/2017   Anxiety 07/15/2017   Chronic sinusitis 06/14/2017   PANIC DISORDER,NO AGORAPHOBIA 10/13/2010   TOBACCO ABUSE 08/13/2008   Back pain 01/19/2008   Depression 06/15/2007    PCP: Gayle Saddie FALCON, PA-C  REFERRING PROVIDER: Suzann Inocente HERO, MD   REFERRING DIAG: K58.1 (ICD-10-CM) - Irritable bowel syndrome with constipation   THERAPY DIAG:  Other lack of coordination - Plan: PT plan of care cert/re-cert  Rectal pain - Plan: PT plan of care cert/re-cert  Abdominal pain, unspecified abdominal location - Plan: PT plan of care  cert/re-cert  Rationale for Evaluation and Treatment: Rehabilitation  ONSET DATE: 03/2023  SUBJECTIVE:                                                                                                                                                                                           SUBJECTIVE STATEMENT: Occasional 'wet gas' and urgency, possibly related to incomplete evacuation or dietary factors. Biggest problem is bloating.  She is on the FODMAP diet. Patient had pelvic floro biofeedback.  Fluid intake:   FUNCTIONAL LIMITATIONS: none  PERTINENT HISTORY:  Medications for current condition: Pantoprazole ; Famotidine ; Dicyclomine  Surgeries: Rectal polypectomy Other: Cardiomyopathy; IBS; RLS Sexual abuse: No   PAIN:  Are you having pain? Yes NPRS scale: 5-7/10 Pain location: sides of the lower abdomen  Pain type: bloating Pain description: intermittent   Aggravating factors: bloating and lay on left side Relieving factors: drink orange Gatorade  PRECAUTIONS: None  RED FLAGS: None   WEIGHT BEARING RESTRICTIONS: No  FALLS:  Has patient fallen in last 6 months? No  OCCUPATION: retired  ACTIVITY LEVEL : walking  PLOF: Independent  PATIENT GOALS: reduce bloating, exercises for the pelvic floor   BOWEL MOVEMENT: Pain with bowel movement: Yes pain level is 8/10 and happens 1 time per month Type of bowel movement:Type (Bristol Stool Scale) Type 1,2, 3, 4 , Frequency daily, Strain no, and Splinting no; uses a foot stool for a bowel movement; may have urgency at time Fully empty rectum: Yes: sometimes Leakage: Yes:                                                       Caused by: wet farts Pads: No Fiber supplement/laxative Metamucil  URINATION: Pain with urination: No Fully empty bladder: Yes:                                  Post-void dribble: No Stream: Strong Urgency: sometimes and when she has not drank enough water Frequency:during the day every 2 hours                                                          Nocturia: No   Leakage: Coughing and Sneezing Pads/briefs: No  INTERCOURSE:not active   PREGNANCY: Vaginal deliveries 1 Tearing Yes: 14 stitches Episiotomy No C-section deliveries 0 Currently pregnant No  PROLAPSE: None   OBJECTIVE:  Note: Objective measures were completed at Evaluation unless otherwise noted.  DIAGNOSTIC FINDINGS:  none  PATIENT SURVEYS:  PFIQ-7: 29 CRAIQ-7: 92  COGNITION: Overall cognitive status: Within functional limits for tasks assessed     SENSATION: Light touch: Appears intact    FUNCTIONAL TESTS:  Squat:decreased lumbar extension   POSTURE: rounded shoulders and forward head   LUMBARAROM/PROM: Lumbar ROM is full   LOWER EXTREMITY MNF:Apojuzmjo hip ROM is full   LOWER EXTREMITY MMT:  MMT Right eval Left eval  Hip flexion 4/5 5/5  Hip extension 4/5 4/5  Hip abduction 3+/5 3+/5  Hip adduction 5/5 5/5   (Blank rows = not tested) PALPATION: Pelvic Alignment: ASIS are equal  Abdominal: tenderness located on the right lateral abdomen; tightness in the diaphragm  Diastasis: No Distortion: No  Breathing: good lower rib cage expansion Scar tissue: No                External Perineal Exam: scar on the perineal body has some decreased mobility                             Internal Pelvic Floor: patient not able to push the therapist finger  out and at times with contract the pelvic floor instead of relaxing  Patient confirms identification and approves PT to assess internal pelvic floor and treatment Yes No emotional/communication barriers or cognitive limitation. Patient is motivated to learn. Patient understands and agrees with treatment goals and plan. PT explains patient will be examined in standing, sitting, and lying down to see how their muscles and joints work. When they are ready, they will be asked to remove their underwear so PT can examine their perineum. The patient is  also given the option of providing their own chaperone as one is not provided in our facility. The patient also has the right and is explained the right to defer or refuse any part of the evaluation or treatment including the internal exam. With the patient's consent, PT will use one gloved finger to gently assess the muscles of the pelvic floor, seeing how well it contracts and relaxes and if there is muscle symmetry. After, the patient will get dressed and PT and patient will discuss exam findings and plan of care. PT and patient discuss plan of care, schedule, attendance policy and HEP activities.   PELVIC MMT:   MMT eval  Vaginal   Internal Anal Sphincter 3/5  External Anal Sphincter 3/5  Puborectalis 3/5  (Blank rows = not tested)        TONE: Average tone  PROLAPSE: none  TODAY'S TREATMENT:                                                                                                                              DATE: 08/17/24  EVAL Examination completed, findings reviewed, pt educated on POC, HEP, and female pelvic floor anatomy, reasoning with pelvic floor assessment internally with pt consent, and abdominal massage. Pt motivated to participate in PT and agreeable to attempt recommendations.     PATIENT EDUCATION:  08/17/24 Education details: Access Code: [email or web address] Person educated: Patient Education method: Explanation, Demonstration, Tactile cues, Verbal cues, and Handouts Education comprehension: verbalized understanding, returned demonstration, verbal cues required, tactile cues required, and needs further education  HOME EXERCISE PROGRAM: 08/17/24 Access Code: [email or web address] URL: [email or web address] Date: 08/17/2024 Prepared by: Channing Pereyra  Patient Education - Abdominal Massage for Constipation  For all possible CPT codes, reference the Planned Interventions line above.     Check all conditions that are expected to impact treatment: {Conditions  expected to impact treatment:Unknown   If treatment provided at initial evaluation, no treatment charged due to lack of authorization.       ASSESSMENT:  CLINICAL IMPRESSION: Patient is a 63 y.o. female who was seen today for physical therapy evaluation and treatment for IBS with constipation. Patient has had issues for 1 year. She will get abdominal pain at level  5-7/10 when her  abdomen is bloated. She will have pain 1 time per month with a bowel movement at level 8/10. Her bowel movements are Type 1,2,3,4. It is slow for her to push  her stool out. She does not strain. She will have liquid come out when she has gas at times. Pelvic floor strength is 3/5. She was not able to push the therapist finger out of the rectum and at time will contract her pelvic floor. Patient will benefit from skilled therapy to improve pelvic floor coordination to push stool out, reduce liquid from coming out, and reduce pain.   OBJECTIVE IMPAIRMENTS: decreased coordination, decreased strength, increased fascial restrictions, and pain.   ACTIVITY LIMITATIONS: continence and toileting  PARTICIPATION LIMITATIONS: shopping and community activity  PERSONAL FACTORS: 1-2 comorbidities: Cardiomyopathy; IBS; RLS are also affecting patient's functional outcome.   REHAB POTENTIAL: Good  CLINICAL DECISION MAKING: Evolving/moderate complexity  EVALUATION COMPLEXITY: Moderate   GOALS: Goals reviewed with patient? Yes  SHORT TERM GOALS: Target date: 09/14/24  Patient educated on abdominal massage to reduce bloating of the abdomen.  Baseline:not educated yet.  Goal status: INITIAL   LONG TERM GOALS: Target date: 02/15/25  Patient independent with advanced HEP for core, pelvic floor and hip strength to reduce continence.  Baseline: not educated yet Goal status: INITIAL  2.  Patient is able to push the therapist finger out of the rectum so she will be able to push stool out fully to reduce bloating in the  abdomen.  Baseline: not able to push the therapist finger out of the rectum.  Goal status: INITIAL  3.  Patient able to reports her abdominal pain decreased </= 2/10 due to reduction of bloating and improved evacuation of stool.  Baseline: pain level 6/10 Goal status: INITIAL   PLAN:  PT FREQUENCY: 1x/week  PT DURATION: 6 months  PLANNED INTERVENTIONS: 97110-Therapeutic exercises, 97530- Therapeutic activity, 97112- Neuromuscular re-education, 97535- Self Care, 02859- Manual therapy, G0283- Electrical stimulation (unattended), Patient/Family education, Joint mobilization, Spinal mobilization, Cryotherapy, Moist heat, and Biofeedback  PLAN FOR NEXT SESSION: manual work to the abdomen; pelvic floor EMG to work on pushing stool out, diaphragmatic breathing   Channing Pereyra, PT 08/17/24 2:01 PM

## 2024-08-16 NOTE — Telephone Encounter (Signed)
 See telephone note from 10/22

## 2024-08-16 NOTE — Telephone Encounter (Signed)
 Brand name Entresto  ordered. See telephone note from 10/21

## 2024-08-17 ENCOUNTER — Encounter: Attending: Pediatrics | Admitting: Physical Therapy

## 2024-08-17 ENCOUNTER — Other Ambulatory Visit (HOSPITAL_COMMUNITY): Payer: Self-pay

## 2024-08-17 ENCOUNTER — Other Ambulatory Visit: Payer: Self-pay

## 2024-08-17 ENCOUNTER — Encounter: Payer: Self-pay | Admitting: Physical Therapy

## 2024-08-17 DIAGNOSIS — R109 Unspecified abdominal pain: Secondary | ICD-10-CM | POA: Diagnosis present

## 2024-08-17 DIAGNOSIS — K6289 Other specified diseases of anus and rectum: Secondary | ICD-10-CM | POA: Diagnosis present

## 2024-08-17 DIAGNOSIS — R278 Other lack of coordination: Secondary | ICD-10-CM | POA: Insufficient documentation

## 2024-08-17 NOTE — Telephone Encounter (Signed)
 Rx now getting ready for 4.00

## 2024-08-24 ENCOUNTER — Encounter: Admitting: Physical Therapy

## 2024-08-24 ENCOUNTER — Encounter: Payer: Self-pay | Admitting: Physical Therapy

## 2024-08-24 DIAGNOSIS — R278 Other lack of coordination: Secondary | ICD-10-CM

## 2024-08-24 DIAGNOSIS — K6289 Other specified diseases of anus and rectum: Secondary | ICD-10-CM

## 2024-08-24 DIAGNOSIS — R109 Unspecified abdominal pain: Secondary | ICD-10-CM

## 2024-08-24 NOTE — Therapy (Signed)
 OUTPATIENT PHYSICAL THERAPY FEMALE PELVIC TREATMENT   Patient Name: Felicia Good MRN: [id number] DOB:[birth year], 63 y.o., female Today's Date: 08/24/2024  END OF SESSION:  PT End of Session - 08/24/24 1307     Visit Number 2    Date for Recertification  02/15/25    Authorization Type [employer] mediccaid    PT Start Time 1300    PT Stop Time 1345    PT Time Calculation (min) 45 min    Activity Tolerance Patient tolerated treatment well    Behavior During Therapy WFL for tasks assessed/performed          Past Medical History:  Diagnosis Date   Anxiety    Cardiomyopathy (HCC)    Depression    Dyslipidemia    IBS (irritable bowel syndrome)    Migraine headache    Mitral valve prolapse    Panic attacks    Rectal polyp    RLS (restless legs syndrome)    Past Surgical History:  Procedure Laterality Date   RECTAL POLYPECTOMY     Patient Active Problem List   Diagnosis Date Noted   Change in bowel habits 12/15/2023   Diarrhea of presumed infectious origin 12/15/2023   Nonrheumatic mitral valve regurgitation 09/06/2023   Cardiomyopathy (HCC) 09/06/2023   Gastroesophageal reflux disease 08/07/2021   Irritable bowel syndrome 08/07/2021   Female stress incontinence 07/25/2021   Mitral valve prolapse 07/25/2021   H/O ulcer disease 05/08/2019   Brown hairy tongue 07/30/2017   Elevated liver function tests 07/15/2017   Hyperlipidemia 07/15/2017   Anxiety 07/15/2017   Chronic sinusitis 06/14/2017   PANIC DISORDER,NO AGORAPHOBIA 10/13/2010   TOBACCO ABUSE 08/13/2008   Back pain 01/19/2008   Depression 06/15/2007    PCP: Gayle Saddie FALCON, PA-C  REFERRING PROVIDER: Suzann Inocente HERO, MD   REFERRING DIAG: K58.1 (ICD-10-CM) - Irritable bowel syndrome with constipation   THERAPY DIAG:  Other lack of coordination  Rectal pain  Abdominal pain, unspecified abdominal location  Rationale for Evaluation and Treatment: Rehabilitation  ONSET DATE: 03/2023  SUBJECTIVE:                                                                                                                                                                                            SUBJECTIVE STATEMENT: I have been doing the exercises. I had a sharp pain and massaged it to help it. No wet farts right now. No rectal pain with bowel movements since last visit.   FUNCTIONAL LIMITATIONS: none  PERTINENT HISTORY:  Medications for current condition: Pantoprazole ; Famotidine ; Dicyclomine  Surgeries: Rectal polypectomy Other: Cardiomyopathy; IBS; RLS Sexual abuse: No  PAIN:  Are you having pain? Yes NPRS scale: 5-7/10 Pain location: sides of the lower abdomen  Pain type: bloating Pain description: intermittent   Aggravating factors: bloating and lay on left side Relieving factors: drink orange Gatorade  PRECAUTIONS: None  RED FLAGS: None   WEIGHT BEARING RESTRICTIONS: No  FALLS:  Has patient fallen in last 6 months? No  OCCUPATION: retired  ACTIVITY LEVEL : walking  PLOF: Independent  PATIENT GOALS: reduce bloating, exercises for the pelvic floor   BOWEL MOVEMENT: Pain with bowel movement: Yes pain level is 8/10 and happens 1 time per month Type of bowel movement:Type (Bristol Stool Scale) Type 1,2, 3, 4 , Frequency daily, Strain no, and Splinting no; uses a foot stool for a bowel movement; may have urgency at time Fully empty rectum: Yes: sometimes Leakage: Yes:                                                       Caused by: wet farts Pads: No Fiber supplement/laxative Metamucil  URINATION: Pain with urination: No Fully empty bladder: Yes:                                  Post-void dribble: No Stream: Strong Urgency: sometimes and when she has not drank enough water Frequency:during the day every 2 hours                                                         Nocturia: No   Leakage: Coughing and Sneezing Pads/briefs: No  INTERCOURSE:not active   PREGNANCY: Vaginal  deliveries 1 Tearing Yes: 14 stitches Episiotomy No C-section deliveries 0 Currently pregnant No  PROLAPSE: None   OBJECTIVE:  Note: Objective measures were completed at Evaluation unless otherwise noted.  DIAGNOSTIC FINDINGS:  none  PATIENT SURVEYS:  PFIQ-7: 29 CRAIQ-7: 52  COGNITION: Overall cognitive status: Within functional limits for tasks assessed     SENSATION: Light touch: Appears intact    FUNCTIONAL TESTS:  Squat:decreased lumbar extension   POSTURE: rounded shoulders and forward head   LUMBARAROM/PROM: Lumbar ROM is full   LOWER EXTREMITY MNF:Apojuzmjo hip ROM is full   LOWER EXTREMITY MMT:  MMT Right eval Left eval  Hip flexion 4/5 5/5  Hip extension 4/5 4/5  Hip abduction 3+/5 3+/5  Hip adduction 5/5 5/5   (Blank rows = not tested) PALPATION: Pelvic Alignment: ASIS are equal  Abdominal: tenderness located on the right lateral abdomen; tightness in the diaphragm  Diastasis: No Distortion: No  Breathing: good lower rib cage expansion Scar tissue: No                External Perineal Exam: scar on the perineal body has some decreased mobility                             Internal Pelvic Floor: patient not able to push the therapist finger out and at times with contract the pelvic floor instead of relaxing  Patient confirms identification and approves PT to assess internal  pelvic floor and treatment Yes No emotional/communication barriers or cognitive limitation. Patient is motivated to learn. Patient understands and agrees with treatment goals and plan. PT explains patient will be examined in standing, sitting, and lying down to see how their muscles and joints work. When they are ready, they will be asked to remove their underwear so PT can examine their perineum. The patient is also given the option of providing their own chaperone as one is not provided in our facility. The patient also has the right and is explained the right to defer or  refuse any part of the evaluation or treatment including the internal exam. With the patient's consent, PT will use one gloved finger to gently assess the muscles of the pelvic floor, seeing how well it contracts and relaxes and if there is muscle symmetry. After, the patient will get dressed and PT and patient will discuss exam findings and plan of care. PT and patient discuss plan of care, schedule, attendance policy and HEP activities.   PELVIC MMT:   MMT eval  Vaginal   Internal Anal Sphincter 3/5  External Anal Sphincter 3/5  Puborectalis 3/5  (Blank rows = not tested)        TONE: Average tone  PROLAPSE: none  TODAY'S TREATMENT:     08/24/24 Manual: Soft tissue mobilization: Circular massage to the abdomen to promote peristalic motion of the intestines then reviewed with patient on what she is to do Manual work to the diaphragm for diaphragmatic breathing Myofascial release: Tissue rolling of the abdomen and on the lower rib cage Fascial release around the lower abdomen and laterally going through the restrictions around the intestines to reduce the bloating Neuromuscular re-education: Down training: Diaphragmatic breathing is supine with tactile cues to the rib cage to expand prior to the abdomen Exercises: Stretches/mobility: Karolynn pose with diaphragmatic breathing Supine pull knee across the trunk                                                                                                                             DATE: 08/17/24  EVAL Examination completed, findings reviewed, pt educated on POC, HEP, and female pelvic floor anatomy, reasoning with pelvic floor assessment internally with pt consent, and abdominal massage. Pt motivated to participate in PT and agreeable to attempt recommendations.     PATIENT EDUCATION:  08/24/24 Education details: Access Code: [email or web address] Person educated: Patient Education method: Explanation, Demonstration, Tactile cues,  Verbal cues, and Handouts Education comprehension: verbalized understanding, returned demonstration, verbal cues required, tactile cues required, and needs further education  HOME EXERCISE PROGRAM: 08/24/24 Access Code: [email or web address] URL: [email or web address] Date: 08/24/2024 Prepared by: Channing Pereyra  Exercises - Supine Diaphragmatic Breathing  - 2 x daily - 7 x weekly - 1 sets - 10 reps - Diaphragmatic Breathing in Child's Pose with Pelvic Floor Relaxation  - 1 x daily - 7 x weekly - 1 sets - 2 reps - 30 sec hold - Supine  Piriformis Stretch with Leg Straight  - 1 x daily - 7 x weekly - 1 sets - 2 reps - 30 sec hold - Happy Baby with Pelvic Floor Lengthening  - 1 x daily - 7 x weekly - 1 sets - 2 reps - 30 sec hold  Patient Education - Abdominal Massage for Constipation        ASSESSMENT:  CLINICAL IMPRESSION: Patient is a 63 y.o. female who was seen today for physical therapy  treatment for IBS with constipation. Patient was able to expand the lower rib cage and feel the pelvic floor relax with her diaphragmatic breathing. She had increased softness of her abdomen. She has not had pain in the rectum since the eval. . Patient will benefit from skilled therapy to improve pelvic floor coordination to push stool out, reduce liquid from coming out, and reduce pain.   OBJECTIVE IMPAIRMENTS: decreased coordination, decreased strength, increased fascial restrictions, and pain.   ACTIVITY LIMITATIONS: continence and toileting  PARTICIPATION LIMITATIONS: shopping and community activity  PERSONAL FACTORS: 1-2 comorbidities: Cardiomyopathy; IBS; RLS are also affecting patient's functional outcome.   REHAB POTENTIAL: Good  CLINICAL DECISION MAKING: Evolving/moderate complexity  EVALUATION COMPLEXITY: Moderate   GOALS: Goals reviewed with patient? Yes  SHORT TERM GOALS: Target date: 09/14/24  Patient educated on abdominal massage to reduce bloating of the abdomen.   Baseline:not educated yet.  Goal status: INITIAL   LONG TERM GOALS: Target date: 02/15/25  Patient independent with advanced HEP for core, pelvic floor and hip strength to reduce continence.  Baseline: not educated yet Goal status: INITIAL  2.  Patient is able to push the therapist finger out of the rectum so she will be able to push stool out fully to reduce bloating in the abdomen.  Baseline: not able to push the therapist finger out of the rectum.  Goal status: INITIAL  3.  Patient able to reports her abdominal pain decreased </= 2/10 due to reduction of bloating and improved evacuation of stool.  Baseline: pain level 6/10 Goal status: INITIAL   PLAN:  PT FREQUENCY: 1x/week  PT DURATION: 6 months  PLANNED INTERVENTIONS: 97110-Therapeutic exercises, 97530- Therapeutic activity, 97112- Neuromuscular re-education, 97535- Self Care, 02859- Manual therapy, G0283- Electrical stimulation (unattended), Patient/Family education, Joint mobilization, Spinal mobilization, Cryotherapy, Moist heat, and Biofeedback  PLAN FOR NEXT SESSION: manual work to the abdomen; pelvic floor EMG to work on pushing stool out,   Channing Pereyra, PT 08/24/24 1:07 PM

## 2024-08-31 ENCOUNTER — Encounter: Payer: Self-pay | Admitting: Physical Therapy

## 2024-08-31 ENCOUNTER — Encounter: Attending: Pediatrics | Admitting: Physical Therapy

## 2024-08-31 DIAGNOSIS — K6289 Other specified diseases of anus and rectum: Secondary | ICD-10-CM | POA: Diagnosis present

## 2024-08-31 DIAGNOSIS — R278 Other lack of coordination: Secondary | ICD-10-CM | POA: Diagnosis present

## 2024-08-31 DIAGNOSIS — R109 Unspecified abdominal pain: Secondary | ICD-10-CM | POA: Diagnosis present

## 2024-08-31 NOTE — Therapy (Signed)
 OUTPATIENT PHYSICAL THERAPY FEMALE PELVIC TREATMENT   Patient Name: Felicia Good MRN: [id number] DOB:1961-05-16, 63 y.o., female Today's Date: 08/31/2024  END OF SESSION:  PT End of Session - 08/31/24 1302     Visit Number 3    Date for Recertification  02/15/25    Authorization Type [employer] mediccaid    PT Start Time 1300    PT Stop Time 1345    PT Time Calculation (min) 45 min    Activity Tolerance Patient tolerated treatment well    Behavior During Therapy WFL for tasks assessed/performed          Past Medical History:  Diagnosis Date   Anxiety    Cardiomyopathy (HCC)    Depression    Dyslipidemia    IBS (irritable bowel syndrome)    Migraine headache    Mitral valve prolapse    Panic attacks    Rectal polyp    RLS (restless legs syndrome)    Past Surgical History:  Procedure Laterality Date   RECTAL POLYPECTOMY     Patient Active Problem List   Diagnosis Date Noted   Change in bowel habits 12/15/2023   Diarrhea of presumed infectious origin 12/15/2023   Nonrheumatic mitral valve regurgitation 09/06/2023   Cardiomyopathy (HCC) 09/06/2023   Gastroesophageal reflux disease 08/07/2021   Irritable bowel syndrome 08/07/2021   Female stress incontinence 07/25/2021   Mitral valve prolapse 07/25/2021   H/O ulcer disease 05/08/2019   Brown hairy tongue 07/30/2017   Elevated liver function tests 07/15/2017   Hyperlipidemia 07/15/2017   Anxiety 07/15/2017   Chronic sinusitis 06/14/2017   PANIC DISORDER,NO AGORAPHOBIA 10/13/2010   TOBACCO ABUSE 08/13/2008   Back pain 01/19/2008   Depression 06/15/2007    PCP: Gayle Saddie FALCON, PA-C  REFERRING PROVIDER: Suzann Inocente HERO, MD   REFERRING DIAG: K58.1 (ICD-10-CM) - Irritable bowel syndrome with constipation   THERAPY DIAG:  Other lack of coordination  Rectal pain  Abdominal pain, unspecified abdominal location  Rationale for Evaluation and Treatment: Rehabilitation  ONSET DATE: 03/2023  SUBJECTIVE:                                                                                                                                                                                            SUBJECTIVE STATEMENT: My ribs were sore. I am doing the exercises. I had wet gas yesterday. Patient has a wet fart with sitting on commode. Exercises help with abdominal pain. Last time she had abdominal pain was last week.   FUNCTIONAL LIMITATIONS: none  PERTINENT HISTORY:  Medications for current condition: Pantoprazole ; Famotidine ; Dicyclomine  Surgeries: Rectal polypectomy Other: Cardiomyopathy; IBS;  RLS Sexual abuse: No   PAIN:  Are you having pain? Yes NPRS scale: 5-7/10 Pain location: sides of the lower abdomen  Pain type: bloating Pain description: intermittent   Aggravating factors: bloating and lay on left side Relieving factors: drink orange Gatorade  PRECAUTIONS: None  RED FLAGS: None   WEIGHT BEARING RESTRICTIONS: No  FALLS:  Has patient fallen in last 6 months? No  OCCUPATION: retired  ACTIVITY LEVEL : walking  PLOF: Independent  PATIENT GOALS: reduce bloating, exercises for the pelvic floor   BOWEL MOVEMENT: Pain with bowel movement: Yes pain level is 8/10 and happens 1 time per month Type of bowel movement:Type (Bristol Stool Scale) Type 1,2, 3, 4 , Frequency daily, Strain no, and Splinting no; uses a foot stool for a bowel movement; may have urgency at time Fully empty rectum: Yes: sometimes Leakage: Yes:                                                       Caused by: wet farts Pads: No Fiber supplement/laxative Metamucil  URINATION: Pain with urination: No Fully empty bladder: Yes:                                  Post-void dribble: No Stream: Strong Urgency: sometimes and when she has not drank enough water Frequency:during the day every 2 hours                                                         Nocturia: No   Leakage: Coughing and Sneezing Pads/briefs:  No  INTERCOURSE:not active   PREGNANCY: Vaginal deliveries 1 Tearing Yes: 14 stitches Episiotomy No C-section deliveries 0 Currently pregnant No  PROLAPSE: None   OBJECTIVE:  Note: Objective measures were completed at Evaluation unless otherwise noted.  DIAGNOSTIC FINDINGS:  none  PATIENT SURVEYS:  PFIQ-7: 77 CRAIQ-7: 58  COGNITION: Overall cognitive status: Within functional limits for tasks assessed     SENSATION: Light touch: Appears intact    FUNCTIONAL TESTS:  Squat:decreased lumbar extension   POSTURE: rounded shoulders and forward head   LUMBARAROM/PROM: Lumbar ROM is full   LOWER EXTREMITY MNF:Apojuzmjo hip ROM is full   LOWER EXTREMITY MMT:  MMT Right eval Left eval  Hip flexion 4/5 5/5  Hip extension 4/5 4/5  Hip abduction 3+/5 3+/5  Hip adduction 5/5 5/5   (Blank rows = not tested) PALPATION: Pelvic Alignment: ASIS are equal  Abdominal: tenderness located on the right lateral abdomen; tightness in the diaphragm  Diastasis: No Distortion: No  Breathing: good lower rib cage expansion Scar tissue: No                External Perineal Exam: scar on the perineal body has some decreased mobility                             Internal Pelvic Floor: patient not able to push the therapist finger out and at times with contract the pelvic floor instead of relaxing  Patient confirms identification  and approves PT to assess internal pelvic floor and treatment Yes No emotional/communication barriers or cognitive limitation. Patient is motivated to learn. Patient understands and agrees with treatment goals and plan. PT explains patient will be examined in standing, sitting, and lying down to see how their muscles and joints work. When they are ready, they will be asked to remove their underwear so PT can examine their perineum. The patient is also given the option of providing their own chaperone as one is not provided in our facility. The patient also  has the right and is explained the right to defer or refuse any part of the evaluation or treatment including the internal exam. With the patient's consent, PT will use one gloved finger to gently assess the muscles of the pelvic floor, seeing how well it contracts and relaxes and if there is muscle symmetry. After, the patient will get dressed and PT and patient will discuss exam findings and plan of care. PT and patient discuss plan of care, schedule, attendance policy and HEP activities.   PELVIC MMT:   MMT eval  Vaginal   Internal Anal Sphincter 3/5  External Anal Sphincter 3/5  Puborectalis 3/5  (Blank rows = not tested)        TONE: Average tone  PROLAPSE: none  TODAY'S TREATMENT:    08/31/24 Manual: Soft tissue mobilization: Circular massage to the abdomen for peristalic motion of the intestines ILU massage to the abdomen Manual work to the diaphragm and upper abdomen to reduce the fascial restrictions Myofascial release: Tissue rolling of the abdomen and around the rib cage to reduce restriction and around the lower abdomen Neuromuscular re-education: Form correction: Open book stretch with breath to open up the rib cage 10 x each side Cat cow 20 x  Down training: Diaphragmatic breathing to relax the pelvic floor     08/24/24 Manual: Soft tissue mobilization: Circular massage to the abdomen to promote peristalic motion of the intestines then reviewed with patient on what she is to do Manual work to the diaphragm for diaphragmatic breathing Myofascial release: Tissue rolling of the abdomen and on the lower rib cage Fascial release around the lower abdomen and laterally going through the restrictions around the intestines to reduce the bloating Neuromuscular re-education: Down training: Diaphragmatic breathing is supine with tactile cues to the rib cage to expand prior to the abdomen Exercises: Stretches/mobility: Karolynn pose with diaphragmatic breathing Supine  pull knee across the trunk                                                                                                                             DATE: 08/17/24  EVAL Examination completed, findings reviewed, pt educated on POC, HEP, and female pelvic floor anatomy, reasoning with pelvic floor assessment internally with pt consent, and abdominal massage. Pt motivated to participate in PT and agreeable to attempt recommendations.     PATIENT EDUCATION:  08/24/24 Education details: Access Code: [email or web address] Person  educated: Patient Education method: Explanation, Demonstration, Tactile cues, Verbal cues, and Handouts Education comprehension: verbalized understanding, returned demonstration, verbal cues required, tactile cues required, and needs further education  HOME EXERCISE PROGRAM: 08/24/24 Access Code: [email or web address] URL: [email or web address] Date: 08/24/2024 Prepared by: Channing Pereyra  Exercises - Supine Diaphragmatic Breathing  - 2 x daily - 7 x weekly - 1 sets - 10 reps - Diaphragmatic Breathing in Child's Pose with Pelvic Floor Relaxation  - 1 x daily - 7 x weekly - 1 sets - 2 reps - 30 sec hold - Supine Piriformis Stretch with Leg Straight  - 1 x daily - 7 x weekly - 1 sets - 2 reps - 30 sec hold - Happy Baby with Pelvic Floor Lengthening  - 1 x daily - 7 x weekly - 1 sets - 2 reps - 30 sec hold  Patient Education - Abdominal Massage for Constipation        ASSESSMENT:  CLINICAL IMPRESSION: Patient is a 63 y.o. female who was seen today for physical therapy  treatment for IBS with constipation. Patient feels the last week her bowel movements are daily. She does not have trouble getting the stool out. Wet farts happen when she is on the commode. Patient had good bowel sounds with the manual work. She was able to feel the pelvic floor relax with diaphragmatic breathing. Patient will benefit from skilled therapy to improve pelvic floor coordination to push stool out,  reduce liquid from coming out, and reduce pain.   OBJECTIVE IMPAIRMENTS: decreased coordination, decreased strength, increased fascial restrictions, and pain.   ACTIVITY LIMITATIONS: continence and toileting  PARTICIPATION LIMITATIONS: shopping and community activity  PERSONAL FACTORS: 1-2 comorbidities: Cardiomyopathy; IBS; RLS are also affecting patient's functional outcome.   REHAB POTENTIAL: Good  CLINICAL DECISION MAKING: Evolving/moderate complexity  EVALUATION COMPLEXITY: Moderate   GOALS: Goals reviewed with patient? Yes  SHORT TERM GOALS: Target date: 09/14/24  Patient educated on abdominal massage to reduce bloating of the abdomen.  Baseline:not educated yet.  Goal status: Met 08/31/24   LONG TERM GOALS: Target date: 02/15/25  Patient independent with advanced HEP for core, pelvic floor and hip strength to reduce continence.  Baseline: not educated yet Goal status: INITIAL  2.  Patient is able to push the therapist finger out of the rectum so she will be able to push stool out fully to reduce bloating in the abdomen.  Baseline: not able to push the therapist finger out of the rectum.  Goal status: INITIAL  3.  Patient able to reports her abdominal pain decreased </= 2/10 due to reduction of bloating and improved evacuation of stool.  Baseline: pain level 6/10 Goal status: INITIAL   PLAN:  PT FREQUENCY: 1x/week  PT DURATION: 6 months  PLANNED INTERVENTIONS: 97110-Therapeutic exercises, 97530- Therapeutic activity, 97112- Neuromuscular re-education, 97535- Self Care, [id number]- Manual therapy, [id number]- Electrical stimulation (unattended), Patient/Family education, Joint mobilization, Spinal mobilization, Cryotherapy, Moist heat, and Biofeedback  PLAN FOR NEXT SESSION: manual work to the abdomen; if doing well then discharge  Channing Pereyra, PT 08/31/24 1:51 PM

## 2024-09-07 ENCOUNTER — Encounter: Admitting: Physical Therapy

## 2024-09-07 ENCOUNTER — Encounter: Payer: Self-pay | Admitting: Physical Therapy

## 2024-09-07 DIAGNOSIS — R278 Other lack of coordination: Secondary | ICD-10-CM | POA: Diagnosis not present

## 2024-09-07 DIAGNOSIS — R109 Unspecified abdominal pain: Secondary | ICD-10-CM

## 2024-09-07 DIAGNOSIS — K6289 Other specified diseases of anus and rectum: Secondary | ICD-10-CM

## 2024-09-07 NOTE — Therapy (Signed)
 OUTPATIENT PHYSICAL THERAPY FEMALE PELVIC TREATMENT   Patient Name: Felicia Good MRN: [id number] DOB:25-Jun-1961, 63 y.o., female Today's Date: 09/07/2024  END OF SESSION:  PT End of Session - 09/07/24 1309     Visit Number 4    Date for Recertification  02/15/25    Authorization Type UHC medicaid    PT Start Time 1305    PT Stop Time 1350    PT Time Calculation (min) 45 min    Activity Tolerance Patient tolerated treatment well    Behavior During Therapy WFL for tasks assessed/performed          Past Medical History:  Diagnosis Date   Anxiety    Cardiomyopathy (HCC)    Depression    Dyslipidemia    IBS (irritable bowel syndrome)    Migraine headache    Mitral valve prolapse    Panic attacks    Rectal polyp    RLS (restless legs syndrome)    Past Surgical History:  Procedure Laterality Date   RECTAL POLYPECTOMY     Patient Active Problem List   Diagnosis Date Noted   Change in bowel habits 12/15/2023   Diarrhea of presumed infectious origin 12/15/2023   Nonrheumatic mitral valve regurgitation 09/06/2023   Cardiomyopathy (HCC) 09/06/2023   Gastroesophageal reflux disease 08/07/2021   Irritable bowel syndrome 08/07/2021   Female stress incontinence 07/25/2021   Mitral valve prolapse 07/25/2021   H/O ulcer disease 05/08/2019   Brown hairy tongue 07/30/2017   Elevated liver function tests 07/15/2017   Hyperlipidemia 07/15/2017   Anxiety 07/15/2017   Chronic sinusitis 06/14/2017   PANIC DISORDER,NO AGORAPHOBIA 10/13/2010   TOBACCO ABUSE 08/13/2008   Back pain 01/19/2008   Depression 06/15/2007    PCP: Gayle Saddie FALCON, PA-C  REFERRING PROVIDER: Suzann Inocente HERO, MD   REFERRING DIAG: K58.1 (ICD-10-CM) - Irritable bowel syndrome with constipation   THERAPY DIAG:  Other lack of coordination  Rectal pain  Abdominal pain, unspecified abdominal location  Rationale for Evaluation and Treatment: Rehabilitation  ONSET DATE: 03/2023  SUBJECTIVE:                                                                                                                                                                                            SUBJECTIVE STATEMENT: I am the scame.  My ribs were sore. I am doing the exercises. I had wet gas yesterday. Patient has a wet fart with sitting on commode. Exercises help with abdominal pain. Last time she had abdominal pain was last week.   FUNCTIONAL LIMITATIONS: none  PERTINENT HISTORY:  Medications for current condition: Pantoprazole ; Famotidine ; Dicyclomine  Surgeries:  Rectal polypectomy Other: Cardiomyopathy; IBS; RLS Sexual abuse: No   PAIN:  Are you having pain? Yes NPRS scale: 5-7/10 Pain location: sides of the lower abdomen  Pain type: bloating Pain description: intermittent   Aggravating factors: bloating and lay on left side Relieving factors: drink orange Gatorade  PRECAUTIONS: None  RED FLAGS: None   WEIGHT BEARING RESTRICTIONS: No  FALLS:  Has patient fallen in last 6 months? No  OCCUPATION: retired  ACTIVITY LEVEL : walking  PLOF: Independent  PATIENT GOALS: reduce bloating, exercises for the pelvic floor   BOWEL MOVEMENT: Pain with bowel movement: Yes pain level is 8/10 and happens 1 time per month Type of bowel movement:Type (Bristol Stool Scale) Type 1,2, 3, 4 , Frequency daily, Strain no, and Splinting no; uses a foot stool for a bowel movement; may have urgency at time Fully empty rectum: Yes: sometimes Leakage: Yes:                                                       Caused by: wet farts Pads: No Fiber supplement/laxative Metamucil  URINATION: Pain with urination: No Fully empty bladder: Yes:                                  Post-void dribble: No Stream: Strong Urgency: sometimes and when she has not drank enough water Frequency:during the day every 2 hours                                                         Nocturia: No   Leakage: Coughing and  Sneezing Pads/briefs: No  INTERCOURSE:not active   PREGNANCY: Vaginal deliveries 1 Tearing Yes: 14 stitches Episiotomy No C-section deliveries 0 Currently pregnant No  PROLAPSE: None   OBJECTIVE:  Note: Objective measures were completed at Evaluation unless otherwise noted.  DIAGNOSTIC FINDINGS:  none  PATIENT SURVEYS:  PFIQ-7: 47 CRAIQ-7: 40  COGNITION: Overall cognitive status: Within functional limits for tasks assessed     SENSATION: Light touch: Appears intact    FUNCTIONAL TESTS:  Squat:decreased lumbar extension   POSTURE: rounded shoulders and forward head   LUMBARAROM/PROM: Lumbar ROM is full   LOWER EXTREMITY MNF:Apojuzmjo hip ROM is full   LOWER EXTREMITY MMT:  MMT Right eval Left eval  Hip flexion 4/5 5/5  Hip extension 4/5 4/5  Hip abduction 3+/5 3+/5  Hip adduction 5/5 5/5   (Blank rows = not tested) PALPATION: Pelvic Alignment: ASIS are equal  Abdominal: tenderness located on the right lateral abdomen; tightness in the diaphragm  Diastasis: No Distortion: No  Breathing: good lower rib cage expansion Scar tissue: No                External Perineal Exam: scar on the perineal body has some decreased mobility                             Internal Pelvic Floor: patient not able to push the therapist finger out and at times with contract the pelvic floor instead of  relaxing  Patient confirms identification and approves PT to assess internal pelvic floor and treatment Yes No emotional/communication barriers or cognitive limitation. Patient is motivated to learn. Patient understands and agrees with treatment goals and plan. PT explains patient will be examined in standing, sitting, and lying down to see how their muscles and joints work. When they are ready, they will be asked to remove their underwear so PT can examine their perineum. The patient is also given the option of providing their own chaperone as one is not provided in our  facility. The patient also has the right and is explained the right to defer or refuse any part of the evaluation or treatment including the internal exam. With the patient's consent, PT will use one gloved finger to gently assess the muscles of the pelvic floor, seeing how well it contracts and relaxes and if there is muscle symmetry. After, the patient will get dressed and PT and patient will discuss exam findings and plan of care. PT and patient discuss plan of care, schedule, attendance policy and HEP activities.   PELVIC MMT:   MMT eval  Vaginal   Internal Anal Sphincter 3/5  External Anal Sphincter 3/5  Puborectalis 3/5  (Blank rows = not tested)        TONE: Average tone  PROLAPSE: none  TODAY'S TREATMENT:    09/07/24 Manual:   Manual work to the left side of the abdomen and diaphragm Neuromuscular re-education: Core facilitation: Supine transverse abdominus 10 x with tactile cues Supine marching with abdominal engagement 20 x  Laying on side hip abduction 20 x each with core engagement Exercises: Stretches/mobility: Leaning on counter doing the cat camel 15 x  Happy Baby with diaphragmatic breathing  Supine pull knee across the trunk hold 30 sec each Open book stretch with breath to open up the rib cage 10 x each side  08/31/24 Manual: Soft tissue mobilization: Circular massage to the abdomen for peristalic motion of the intestines ILU massage to the abdomen Manual work to the diaphragm and upper abdomen to reduce the fascial restrictions Myofascial release: Tissue rolling of the abdomen and around the rib cage to reduce restriction and around the lower abdomen Neuromuscular re-education: Form correction: Open book stretch with breath to open up the rib cage 10 x each side Cat cow 20 x  Down training: Diaphragmatic breathing to relax the pelvic floor     08/24/24 Manual: Circular massage to the abdomen to promote peristalic motion of the intestines then  reviewed with patient on what she is to do Manual work to the diaphragm for diaphragmatic breathing Myofascial release: Tissue rolling of the abdomen and on the lower rib cage Fascial release around the lower abdomen and laterally going through the restrictions around the intestines to reduce the bloating Neuromuscular re-education: Down training: Diaphragmatic breathing is supine with tactile cues to the rib cage to expand prior to the abdomen Exercises: Stretches/mobility: Karolynn pose with diaphragmatic breathing Supine pull knee across the trunk  DATE: 08/17/24  EVAL Examination completed, findings reviewed, pt educated on POC, HEP, and female pelvic floor anatomy, reasoning with pelvic floor assessment internally with pt consent, and abdominal massage. Pt motivated to participate in PT and agreeable to attempt recommendations.     PATIENT EDUCATION:  09/07/24 Education details: Access Code: [email or web address] Person educated: Patient Education method: Explanation, Demonstration, Tactile cues, Verbal cues, and Handouts Education comprehension: verbalized understanding, returned demonstration, verbal cues required, tactile cues required, and needs further education  HOME EXERCISE PROGRAM: 09/07/24 Access Code: [email or web address] URL: [email or web address] Date: 09/07/2024 Prepared by: Channing Pereyra  Exercises - Supine Diaphragmatic Breathing  - 2 x daily - 7 x weekly - 1 sets - 10 reps - Supine Piriformis Stretch with Leg Straight  - 1 x daily - 7 x weekly - 1 sets - 2 reps - 30 sec hold - Happy Baby with Pelvic Floor Lengthening  - 1 x daily - 7 x weekly - 1 sets - 2 reps - 30 sec hold - Modified Cat Cow on Counter  - 1 x daily - 3 x weekly - 3 sets - 10 reps - Sidelying Thoracic Rotation with Open Book  - 1 x daily - 3 x weekly - 1 sets - 10 reps - Supine March   - 1 x daily - 3 x weekly - 1 sets - 10 reps - Sidelying Hip Abduction  - 1 x daily - 3 x weekly - 2 sets - 10 reps  Patient Education - Abdominal Massage for Constipation        ASSESSMENT:  CLINICAL IMPRESSION: Patient is a 63 y.o. female who was seen today for physical therapy  treatment for IBS with constipation. Patient  bowel movements are daily. She does not have trouble getting the stool out. Wet farts happen when she is on the commode.  She was able to feel the pelvic floor relax with diaphragmatic breathing. She has met her goals.   OBJECTIVE IMPAIRMENTS: decreased coordination, decreased strength, increased fascial restrictions, and pain.   ACTIVITY LIMITATIONS: continence and toileting  PARTICIPATION LIMITATIONS: shopping and community activity  PERSONAL FACTORS: 1-2 comorbidities: Cardiomyopathy; IBS; RLS are also affecting patient's functional outcome.   REHAB POTENTIAL: Good  CLINICAL DECISION MAKING: Evolving/moderate complexity  EVALUATION COMPLEXITY: Moderate   GOALS: Goals reviewed with patient? Yes  SHORT TERM GOALS: Target date: 09/14/24  Patient educated on abdominal massage to reduce bloating of the abdomen.  Baseline:not educated yet.  Goal status: Met 08/31/24   LONG TERM GOALS: Target date: 02/15/25  Patient independent with advanced HEP for core, pelvic floor and hip strength to reduce continence.  Baseline: not educated yet Goal status: Met 09/07/24  2.  Patient is able to push the therapist finger out of the rectum so she will be able to push stool out fully to reduce bloating in the abdomen.  Baseline: Patient is able to push the stool out so she does not want therapist to assesses rectum.  Goal status: Met 09/07/24  3.  Patient able to reports her abdominal pain decreased </= 2/10 due to reduction of bloating and improved evacuation of stool.  Baseline: pain level 6/10 Goal status: Met 11/08/23   PLAN: Discharge to HEP   Channing Pereyra, PT 09/07/24 2:01 PM   PHYSICAL THERAPY DISCHARGE SUMMARY  Visits from Start of Care: 4  Current functional level related to goals / functional outcomes: See above.    Remaining deficits: See above   Education / Equipment: HEP  Patient agrees to discharge. Patient goals were met. Patient is being discharged due to meeting the stated rehab goals. Thank you for the referral.   Channing Pereyra, PT 09/07/24 2:01 PM

## 2024-09-14 ENCOUNTER — Telehealth: Payer: Self-pay | Admitting: Cardiology

## 2024-09-14 NOTE — Telephone Encounter (Signed)
 Spoke with the patient who states that she does not need a refill on entresto  right. Advised that when she is ready for a refill to contact us  and we will send in generic. Patient verbalized understanding.

## 2024-09-14 NOTE — Telephone Encounter (Signed)
 Pt c/o medication issue:  1. Name of Medication:   ENTRESTO  24-26 MG   2. How are you currently taking this medication (dosage and times per day)?   As prescribed  3. Are you having a reaction (difficulty breathing--STAT)?   No  4. What is your medication issue?   Patient stated she will be switching insurance in January 2026 and they will only cover the generic version of this medication going forward.  Patient wants to get prescription for generic version of this medication.

## 2024-09-18 ENCOUNTER — Ambulatory Visit (INDEPENDENT_AMBULATORY_CARE_PROVIDER_SITE_OTHER)

## 2024-09-18 DIAGNOSIS — Z23 Encounter for immunization: Secondary | ICD-10-CM | POA: Diagnosis not present

## 2024-09-20 DIAGNOSIS — Z124 Encounter for screening for malignant neoplasm of cervix: Secondary | ICD-10-CM | POA: Diagnosis not present

## 2024-09-20 DIAGNOSIS — Z0000 Encounter for general adult medical examination without abnormal findings: Secondary | ICD-10-CM | POA: Diagnosis not present

## 2024-09-28 ENCOUNTER — Other Ambulatory Visit: Payer: Self-pay

## 2024-09-28 DIAGNOSIS — R7303 Prediabetes: Secondary | ICD-10-CM

## 2024-09-28 DIAGNOSIS — E785 Hyperlipidemia, unspecified: Secondary | ICD-10-CM

## 2024-09-28 LAB — HM PAP SMEAR: HPV, high-risk: NEGATIVE

## 2024-10-02 ENCOUNTER — Other Ambulatory Visit

## 2024-10-02 DIAGNOSIS — R7303 Prediabetes: Secondary | ICD-10-CM | POA: Diagnosis not present

## 2024-10-02 DIAGNOSIS — E785 Hyperlipidemia, unspecified: Secondary | ICD-10-CM

## 2024-10-03 ENCOUNTER — Ambulatory Visit: Payer: Self-pay

## 2024-10-03 ENCOUNTER — Ambulatory Visit: Admitting: Dermatology

## 2024-10-03 DIAGNOSIS — L820 Inflamed seborrheic keratosis: Secondary | ICD-10-CM | POA: Diagnosis not present

## 2024-10-03 DIAGNOSIS — L814 Other melanin hyperpigmentation: Secondary | ICD-10-CM | POA: Diagnosis not present

## 2024-10-03 DIAGNOSIS — L821 Other seborrheic keratosis: Secondary | ICD-10-CM | POA: Diagnosis not present

## 2024-10-03 DIAGNOSIS — Z1283 Encounter for screening for malignant neoplasm of skin: Secondary | ICD-10-CM | POA: Diagnosis not present

## 2024-10-03 DIAGNOSIS — D224 Melanocytic nevi of scalp and neck: Secondary | ICD-10-CM | POA: Diagnosis not present

## 2024-10-03 DIAGNOSIS — L578 Other skin changes due to chronic exposure to nonionizing radiation: Secondary | ICD-10-CM | POA: Diagnosis not present

## 2024-10-03 DIAGNOSIS — W908XXA Exposure to other nonionizing radiation, initial encounter: Secondary | ICD-10-CM | POA: Diagnosis not present

## 2024-10-03 LAB — CBC WITH DIFFERENTIAL/PLATELET
Basophils Absolute: 0.1 x10E3/uL (ref 0.0–0.2)
Basos: 1 %
EOS (ABSOLUTE): 0.2 x10E3/uL (ref 0.0–0.4)
Eos: 3 %
Hematocrit: 41.9 % (ref 34.0–46.6)
Hemoglobin: 13.9 g/dL (ref 11.1–15.9)
Immature Grans (Abs): 0 x10E3/uL (ref 0.0–0.1)
Immature Granulocytes: 0 %
Lymphocytes Absolute: 2.5 x10E3/uL (ref 0.7–3.1)
Lymphs: 45 %
MCH: 32.8 pg (ref 26.6–33.0)
MCHC: 33.2 g/dL (ref 31.5–35.7)
MCV: 99 fL — ABNORMAL HIGH (ref 79–97)
Monocytes Absolute: 0.4 x10E3/uL (ref 0.1–0.9)
Monocytes: 8 %
Neutrophils Absolute: 2.3 x10E3/uL (ref 1.4–7.0)
Neutrophils: 43 %
Platelets: 220 x10E3/uL (ref 150–450)
RBC: 4.24 x10E6/uL (ref 3.77–5.28)
RDW: 13.4 % (ref 11.7–15.4)
WBC: 5.4 x10E3/uL (ref 3.4–10.8)

## 2024-10-03 LAB — HEMOGLOBIN A1C
Est. average glucose Bld gHb Est-mCnc: 114 mg/dL
Hgb A1c MFr Bld: 5.6 % (ref 4.8–5.6)

## 2024-10-03 LAB — COMPREHENSIVE METABOLIC PANEL WITH GFR
ALT: 32 IU/L (ref 0–32)
AST: 24 IU/L (ref 0–40)
Albumin: 4.3 g/dL (ref 3.9–4.9)
Alkaline Phosphatase: 84 IU/L (ref 49–135)
BUN/Creatinine Ratio: 20 (ref 12–28)
BUN: 17 mg/dL (ref 8–27)
Bilirubin Total: 0.4 mg/dL (ref 0.0–1.2)
CO2: 23 mmol/L (ref 20–29)
Calcium: 9.2 mg/dL (ref 8.7–10.3)
Chloride: 106 mmol/L (ref 96–106)
Creatinine, Ser: 0.85 mg/dL (ref 0.57–1.00)
Globulin, Total: 2.2 g/dL (ref 1.5–4.5)
Glucose: 85 mg/dL (ref 70–99)
Potassium: 3.9 mmol/L (ref 3.5–5.2)
Sodium: 143 mmol/L (ref 134–144)
Total Protein: 6.5 g/dL (ref 6.0–8.5)
eGFR: 76 mL/min/1.73 (ref >59)

## 2024-10-03 LAB — LIPID PANEL
Chol/HDL Ratio: 2.9 ratio (ref 0.0–4.4)
Cholesterol, Total: 180 mg/dL (ref 100–199)
HDL: 62 mg/dL (ref >39)
LDL Chol Calc (NIH): 98 mg/dL (ref 0–99)
Triglycerides: 110 mg/dL (ref 0–149)
VLDL Cholesterol Cal: 20 mg/dL (ref 5–40)

## 2024-10-03 NOTE — Patient Instructions (Addendum)

## 2024-10-03 NOTE — Progress Notes (Unsigned)
   Follow-Up Visit   Subjective  Felicia Good is a 63 y.o. female who presents for the following: Skin Cancer Screening and Full Body Skin Exam, she reports concern of a spot on her right groin area flaky with a bump. Dry bumpy patches on her left ear. Sore bump in her right ear.   The patient presents for Total-Body Skin Exam (TBSE) for skin cancer screening and mole check. The patient has spots, moles and lesions to be evaluated, some may be new or changing and the patient may have concern these could be cancer.    The following portions of the chart were reviewed this encounter and updated as appropriate: medications, allergies, medical history  Review of Systems:  No other skin or systemic complaints except as noted in HPI or Assessment and Plan.  Objective  Well appearing patient in no apparent distress; mood and affect are within normal limits.  A full examination was performed including scalp, head, eyes, ears, nose, lips, neck, chest, axillae, abdomen, back, buttocks, bilateral upper extremities, bilateral lower extremities, hands, feet, fingers, toes, fingernails, and toenails. All findings within normal limits unless otherwise noted below.   Relevant physical exam findings are noted in the Assessment and Plan.  right low back x 1, left cheek x 2, right groin x 1, right ear x 1 (5) Stuck-on, waxy, tan-brown papule or plaque --Discussed benign etiology and prognosis.   Assessment & Plan   SKIN CANCER SCREENING PERFORMED TODAY.  ACTINIC DAMAGE - Chronic condition, secondary to cumulative UV/sun exposure - diffuse scaly erythematous macules with underlying dyspigmentation - Recommend daily broad spectrum sunscreen SPF 30+ to sun-exposed areas, reapply every 2 hours as needed.  - Staying in the shade or wearing long sleeves, sun glasses (UVA+UVB protection) and wide brim hats (4-inch brim around the entire circumference of the hat) are also recommended for sun protection.  -  Call for new or changing lesions.  LENTIGINES, SEBORRHEIC KERATOSES, HEMANGIOMAS - Benign normal skin lesions - Benign-appearing - Call for any changes   MELANOCYTIC NEVI - ant crown vertex scalp 0.4 cm flesh colored papule Exam: Tan-brown and/or pink-flesh-colored symmetric macules and papules   Treatment Plan: Benign appearing on exam today. Recommend observation. Call clinic for new or changing moles. Recommend daily use of broad spectrum spf 30+ sunscreen to sun-exposed areas.   INFLAMED SEBORRHEIC KERATOSIS (5) right low back x 1, left cheek x 2, right groin x 1, right ear x 1 (5) Symptomatic, irritating, patient would like treated.  Destruction of lesion - right low back x 1, left cheek x 2, right groin x 1, right ear x 1 (5) Complexity: simple   Destruction method: cryotherapy   Informed consent: discussed and consent obtained   Timeout:  patient name, date of birth, surgical site, and procedure verified Lesion destroyed using liquid nitrogen: Yes   Region frozen until ice ball extended beyond lesion: Yes   Outcome: patient tolerated procedure well with no complications   Post-procedure details: wound care instructions given      Return in about 1 year (around 10/03/2025) for TBSE, hx of ISKs.  IFay Kirks, CMA, am acting as scribe for Alm Rhyme, MD .   Documentation: I have reviewed the above documentation for accuracy and completeness, and I agree with the above.  Alm Rhyme, MD

## 2024-10-04 ENCOUNTER — Encounter: Payer: Self-pay | Admitting: Dermatology

## 2024-10-09 ENCOUNTER — Ambulatory Visit

## 2024-10-09 VITALS — BP 109/73 | HR 67 | Temp 97.5°F | Ht 67.0 in | Wt 153.0 lb

## 2024-10-09 DIAGNOSIS — L304 Erythema intertrigo: Secondary | ICD-10-CM | POA: Insufficient documentation

## 2024-10-09 DIAGNOSIS — F419 Anxiety disorder, unspecified: Secondary | ICD-10-CM

## 2024-10-09 DIAGNOSIS — I429 Cardiomyopathy, unspecified: Secondary | ICD-10-CM | POA: Diagnosis not present

## 2024-10-09 DIAGNOSIS — E785 Hyperlipidemia, unspecified: Secondary | ICD-10-CM

## 2024-10-09 DIAGNOSIS — M545 Low back pain, unspecified: Secondary | ICD-10-CM

## 2024-10-09 DIAGNOSIS — K589 Irritable bowel syndrome without diarrhea: Secondary | ICD-10-CM | POA: Diagnosis not present

## 2024-10-09 DIAGNOSIS — F32A Depression, unspecified: Secondary | ICD-10-CM

## 2024-10-09 DIAGNOSIS — R7989 Other specified abnormal findings of blood chemistry: Secondary | ICD-10-CM | POA: Diagnosis not present

## 2024-10-09 DIAGNOSIS — K219 Gastro-esophageal reflux disease without esophagitis: Secondary | ICD-10-CM

## 2024-10-09 DIAGNOSIS — F410 Panic disorder [episodic paroxysmal anxiety] without agoraphobia: Secondary | ICD-10-CM

## 2024-10-09 DIAGNOSIS — G8929 Other chronic pain: Secondary | ICD-10-CM | POA: Diagnosis not present

## 2024-10-09 MED ORDER — ATORVASTATIN CALCIUM 20 MG PO TABS: 20.0000 mg | ORAL_TABLET | Freq: Every day | ORAL | 1 refills | Status: AC

## 2024-10-09 MED ORDER — BUSPIRONE HCL 10 MG PO TABS: 10.0000 mg | ORAL_TABLET | Freq: Two times a day (BID) | ORAL | 1 refills | Status: AC

## 2024-10-09 MED ORDER — TRIAMCINOLONE ACETONIDE 0.1 % EX CREA: 1.0000 | TOPICAL_CREAM | Freq: Two times a day (BID) | CUTANEOUS | 0 refills | Status: AC

## 2024-10-09 MED ORDER — CLONAZEPAM 0.5 MG PO TABS: 0.5000 mg | ORAL_TABLET | Freq: Every evening | ORAL | 0 refills | Status: AC | PRN

## 2024-10-09 MED ORDER — DICYCLOMINE HCL 20 MG PO TABS: 20.0000 mg | ORAL_TABLET | Freq: Three times a day (TID) | ORAL | 1 refills | Status: AC

## 2024-10-09 MED ORDER — FAMOTIDINE 20 MG PO TABS: 20.0000 mg | ORAL_TABLET | Freq: Every day | ORAL | 2 refills | Status: AC

## 2024-10-09 MED ORDER — PANTOPRAZOLE SODIUM 40 MG PO TBEC: 40.0000 mg | DELAYED_RELEASE_TABLET | Freq: Two times a day (BID) | ORAL | 2 refills | Status: AC

## 2024-10-09 MED ORDER — CYCLOBENZAPRINE HCL 10 MG PO TABS: 10.0000 mg | ORAL_TABLET | Freq: Three times a day (TID) | ORAL | 0 refills | Status: AC | PRN

## 2024-10-09 MED ORDER — CITALOPRAM HYDROBROMIDE 40 MG PO TABS: 40.0000 mg | ORAL_TABLET | Freq: Every day | ORAL | 1 refills | Status: AC

## 2024-10-09 NOTE — Assessment & Plan Note (Signed)
 Rash at panty line, possible intertrigo secondary to friction from recent scar from skin tag removal with dermatologist.  - Ensure area is dry. Consider baby powder at bedtime and vaseline during the day to prevent worsening friction.  - Recommend cutting a cotton t-shirt to place in the groin fold to prevent further friction and to keep the area dry and prevent secondary yeast infection.  - Advised to follow up if lesion does not improve in 2-3 weeks.

## 2024-10-09 NOTE — Assessment & Plan Note (Signed)
 Last lipid panel: LDL 98, HDL 62, Trig 110. The 10-year ASCVD risk score (Arnett DK, et al., 2019) is: 6.2%. Continue Atorvastatin  20 mg daily. LFTs WNL. Will cont to monitor.

## 2024-10-09 NOTE — Assessment & Plan Note (Signed)
 Followed by cardiology.  Continue routine follow-up with cardiology and taking sacubitril -valsartan  24-26 mg twice daily, carvediolol BID, and Jaridance as prescribed by Dr. Lavona.

## 2024-10-09 NOTE — Progress Notes (Signed)
 Established Patient Office Visit  Subjective   Patient ID: Felicia Good, female    DOB: [birth year]  Age: 63 y.o. MRN: [id number]  Chief Complaint  Patient presents with   Medical Management of Chronic Issues    HPI  History of Present Illness   Felicia Good is a 63 year old female who presents for a routine follow-up visit.  Lipid management and statin therapy - Continues atorvastatin  therapy without adverse effects - Pleased with current health status  Tobacco use - History of smoking - Currently smokes one pack every three to four days  Dietary modifications - Avoids fried foods - Limits red meat consumption to once per week in an attempt to keep cholesterol levels down   Dermatologic symptoms - Uses triamcinolone  cream for hives - Rash at panty line described as a simple red line - Recently had a skin tag removed in groin fold and reports that friction from the removal site has caused a new sore to develop. Has not been putting anything on it as of current.   Medication management - Currently taking Jardiance , Entresto , carvedilol , Celexa , Klonopin , and atorvastatin   Preventive health maintenance - Up to date on vaccinations: influenza, pneumonia, shingles, tetanus - Completed health screenings: mammogram, colonoscopy, Pap smear      ROS Per HPI.    Objective:     BP 109/73   Pulse 67   Temp (!) 97.5 F (36.4 C) (Oral)   Ht 5' 7 (1.702 m)   Wt 153 lb 0.6 oz (69.4 kg)   LMP 11/26/2013   SpO2 99%   BMI 23.97 kg/m    Physical Exam Constitutional:      General: She is not in acute distress.    Appearance: Normal appearance.  Cardiovascular:     Rate and Rhythm: Normal rate and regular rhythm.     Heart sounds: Normal heart sounds. No murmur heard.    No friction rub. No gallop.  Pulmonary:     Effort: Pulmonary effort is normal. No respiratory distress.     Breath sounds: Normal breath sounds.  Musculoskeletal:        General: No swelling.   Lymphadenopathy:     Cervical: No cervical adenopathy.  Skin:    General: Skin is warm and dry.     Comments: Small, red, circular blister noted to the groin fold of right side. There is no overlying swelling or discharge. No signs of infection. No evidence of candidal infection as there is absence of satellite lesions and beefy red appearance.   Neurological:     General: No focal deficit present.     Mental Status: She is alert.  Psychiatric:        Mood and Affect: Mood normal.        Behavior: Behavior normal.        Thought Content: Thought content normal.      No results found for any visits on 10/09/24.  Last CBC Lab Results  Component Value Date   WBC 5.4 10/02/2024   HGB 13.9 10/02/2024   HCT 41.9 10/02/2024   MCV 99 (H) 10/02/2024   MCH 32.8 10/02/2024   RDW 13.4 10/02/2024   PLT 220 10/02/2024   Last metabolic panel Lab Results  Component Value Date   GLUCOSE 85 10/02/2024   NA 143 10/02/2024   K 3.9 10/02/2024   CL 106 10/02/2024   CO2 23 10/02/2024   BUN 17 10/02/2024   CREATININE 0.85 10/02/2024  EGFR 76 10/02/2024   CALCIUM  9.2 10/02/2024   PROT 6.5 10/02/2024   ALBUMIN 4.3 10/02/2024   LABGLOB 2.2 10/02/2024   AGRATIO 1.7 01/13/2023   BILITOT 0.4 10/02/2024   ALKPHOS 84 10/02/2024   AST 24 10/02/2024   ALT 32 10/02/2024   ANIONGAP 9 09/08/2018   Last lipids Lab Results  Component Value Date   CHOL 180 10/02/2024   HDL 62 10/02/2024   LDLCALC 98 10/02/2024   LDLDIRECT 128.3 11/15/2012   TRIG 110 10/02/2024   CHOLHDL 2.9 10/02/2024   Last hemoglobin A1c Lab Results  Component Value Date   HGBA1C 5.6 10/02/2024   Last thyroid  functions Lab Results  Component Value Date   TSH 2.19 03/28/2024   Last vitamin D  Lab Results  Component Value Date   VD25OH 28.3 (L) 06/14/2017      The 10-year ASCVD risk score (Arnett DK, et al., 2019) is: 6.2%    Assessment & Plan:   Intertrigo Assessment & Plan: Rash at panty line,  possible intertrigo secondary to friction from recent scar from skin tag removal with dermatologist.  - Ensure area is dry. Consider baby powder at bedtime and vaseline during the day to prevent worsening friction.  - Recommend cutting a cotton t-shirt to place in the groin fold to prevent further friction and to keep the area dry and prevent secondary yeast infection.  - Advised to follow up if lesion does not improve in 2-3 weeks.    Irritable bowel syndrome, unspecified type Assessment & Plan: Refill sent in for Bentyl  20 mg 3 times a day, famotidine  20 mg once a day, pantoprazole  40 mg twice a day. Recommend patient continue with low FODMAP diet and other recommendations per her GI specialist.   Orders: -     Pantoprazole  Sodium; Take 1 tablet (40 mg total) by mouth 2 (two) times daily before a meal.  Dispense: 180 tablet; Refill: 2 -     Famotidine ; Take 1 tablet (20 mg total) by mouth at bedtime.  Dispense: 90 tablet; Refill: 2 -     Dicyclomine  HCl; Take 1 tablet (20 mg total) by mouth 3 (three) times daily before meals.  Dispense: 102 tablet; Refill: 1  Gastroesophageal reflux disease, unspecified whether esophagitis present -     Pantoprazole  Sodium; Take 1 tablet (40 mg total) by mouth 2 (two) times daily before a meal.  Dispense: 180 tablet; Refill: 2 -     Famotidine ; Take 1 tablet (20 mg total) by mouth at bedtime.  Dispense: 90 tablet; Refill: 2  Chronic bilateral low back pain, unspecified whether sciatica present -     Cyclobenzaprine  HCl; Take 1 tablet (10 mg total) by mouth 3 (three) times daily as needed for muscle spasms.  Dispense: 30 tablet; Refill: 0  Anxiety Assessment & Plan: Stable. Continue Celexa  40 mg, buspirone  10 mg up to twice daily, clonazepam  0.5 mg as needed for breakthrough anxiety. Will continue to monitor. Follow-up in 6 months.   Orders: -     busPIRone  HCl; Take 1 tablet (10 mg total) by mouth 2 (two) times daily.  Dispense: 68 tablet; Refill: 1 -      Citalopram  Hydrobromide; Take 1 tablet (40 mg total) by mouth daily.  Dispense: 90 tablet; Refill: 1  Hyperlipidemia, unspecified hyperlipidemia type Assessment & Plan: Last lipid panel: LDL 98, HDL 62, Trig 110. The 10-year ASCVD risk score (Arnett DK, et al., 2019) is: 6.2%. Continue Atorvastatin  20 mg daily. LFTs WNL. Will cont  to monitor.    Orders: -     Atorvastatin  Calcium ; Take 1 tablet (20 mg total) by mouth daily.  Dispense: 90 tablet; Refill: 1  Depression, unspecified depression type -     Citalopram  Hydrobromide; Take 1 tablet (40 mg total) by mouth daily.  Dispense: 90 tablet; Refill: 1  PANIC DISORDER,NO AGORAPHOBIA -     clonazePAM ; Take 1 tablet (0.5 mg total) by mouth at bedtime as needed for anxiety.  Dispense: 30 tablet; Refill: 0  Elevated liver function tests Assessment & Plan: Dec 2025 LFTs within normal limits   Cardiomyopathy, unspecified type [facility]) Assessment & Plan: Followed by cardiology.  Continue routine follow-up with cardiology and taking sacubitril -valsartan  24-26 mg twice daily, carvediolol BID, and Jaridance as prescribed by Dr. Lavona.    Other orders -     Triamcinolone  Acetonide; Apply 1 Application topically 2 (two) times daily.  Dispense: 30 g; Refill: 0    Return in about 6 months (around 04/09/2025) for HTN, HLD, Mood.    Saddie JULIANNA Sacks, PA-C

## 2024-10-09 NOTE — Assessment & Plan Note (Signed)
 Refill sent in for Bentyl  20 mg 3 times a day, famotidine  20 mg once a day, pantoprazole  40 mg twice a day.  Recommend patient continue with low FODMAP diet and other recommendations per her GI specialist.

## 2024-10-09 NOTE — Patient Instructions (Signed)
 VISIT SUMMARY: Today, we reviewed your lipid management, smoking habits, dietary modifications, and dermatologic symptoms. Your cholesterol levels are well-controlled with atorvastatin , and your liver function tests have normalized. We also discussed your chronic urticaria and a rash at your panty line. You are up to date on your vaccinations and health screenings.  YOUR PLAN: HYPERLIPIDEMIA: Your cholesterol levels are controlled with atorvastatin , and your liver function tests have normalized. -Continue taking atorvastatin  as prescribed.  CHRONIC URTICARIA: You have itching that may be worsened by cold weather and clothing, managed with triamcinolone  cream. -Continue using triamcinolone  0.1% cream as needed.  INTERTRIGO: You have a rash at your panty line that could be appears to be intertrigo from friction.  -Keep the area dry. Consider using baby powder and vaseline over the area. I recommend cutting a piece of a cotton t-shirt to place in the area to keep dry during the day and prevent further friction. -If the area does not improve over the next few weeks, please let me know!  GENERAL HEALTH MAINTENANCE: You are up to date on your mammograms, flu shot, pneumonia shot, shingles vaccine, colonoscopy, and tetanus. We are waiting for your Pap smear records. -We have faxed a records request for your Pap smear. -You were offered the COVID vaccine if you would like to receive it.  If you have any problems before your next visit feel free to message me via MyChart (minor issues or questions) or call the office, otherwise you may reach out to schedule an office visit.  Thank you! Saddie Sacks, PA-C

## 2024-10-09 NOTE — Assessment & Plan Note (Signed)
 Dec 2025 LFTs within normal limits

## 2024-10-09 NOTE — Assessment & Plan Note (Signed)
 Stable.  Continue Celexa  40 mg, buspirone  10 mg up to twice daily, clonazepam  0.5 mg as needed for breakthrough anxiety.  Will continue to monitor.  Follow-up in 6 months.

## 2024-10-10 ENCOUNTER — Telehealth: Payer: Self-pay

## 2024-10-10 NOTE — Telephone Encounter (Signed)
-----   Message from Nurse Cristino NOVAK, RN sent at 10/10/2024  8:02 AM EST ----- Regarding: FW: MRI  ----- Message ----- From: Mercer Cristino SAILOR, RN Sent: 10/10/2024  12:00 AM EST To: Cristino SAILOR Mercer, RN Subject: MRI                                             MRI abdomen with and without contrast with Eovist  due - order is in epic

## 2024-10-10 NOTE — Telephone Encounter (Signed)
 Reminder was received in Epic.   Chart reviewed and noted that pt had MRI on 04/27/2024  Results and recommendations were   Alan JONELLE Coombs, PA-C 04/27/2024  7:55 AM EDT     MRI abdomen shows benign focal nodular hyperplasia, no need for follow-up for characterization. Normal gallbladder, pancreas, spleen, bowels. Reassuring MRI abdomen with and without.     Office Note on 06/06/2024 stated   2.  No acute findings in the abdomen.   CTAP 04/06/2024 No acute findings.   12 mm hypervascular mass in left hepatic lobe, which likely represents a benign etiology such as a flash-filling hemangioma, focal nodular hyperplasia, or adenoma. Recommend continued imaging follow-up in 6 months with abdomen MRI without and with contrast.  Please review and advise if recommendation is to proceed with follow up MRI

## 2024-10-10 NOTE — Telephone Encounter (Signed)
 Noted

## 2024-10-16 ENCOUNTER — Other Ambulatory Visit: Payer: Self-pay

## 2024-10-24 ENCOUNTER — Other Ambulatory Visit (HOSPITAL_COMMUNITY): Payer: Self-pay

## 2024-10-25 ENCOUNTER — Other Ambulatory Visit (HOSPITAL_COMMUNITY): Payer: Self-pay

## 2024-11-02 ENCOUNTER — Encounter: Payer: Self-pay | Admitting: Emergency Medicine

## 2024-11-02 ENCOUNTER — Ambulatory Visit: Attending: Emergency Medicine | Admitting: Emergency Medicine

## 2024-11-02 VITALS — BP 102/62 | HR 67 | Ht 67.0 in | Wt 151.0 lb

## 2024-11-02 DIAGNOSIS — I428 Other cardiomyopathies: Secondary | ICD-10-CM

## 2024-11-02 DIAGNOSIS — I341 Nonrheumatic mitral (valve) prolapse: Secondary | ICD-10-CM | POA: Diagnosis not present

## 2024-11-02 DIAGNOSIS — E782 Mixed hyperlipidemia: Secondary | ICD-10-CM

## 2024-11-02 DIAGNOSIS — I5020 Unspecified systolic (congestive) heart failure: Secondary | ICD-10-CM

## 2024-11-02 DIAGNOSIS — F17200 Nicotine dependence, unspecified, uncomplicated: Secondary | ICD-10-CM

## 2024-11-02 NOTE — Patient Instructions (Signed)
 Medication Instructions:  NO CHANGES  Lab Work: NONE TO BE DONE TODAY.  Testing/Procedures: Your physician has requested that you have a cardiac MRI. Cardiac MRI uses a computer to create images of your heart as its beating, producing both still and moving pictures of your heart and major blood vessels. For further information please visit [email or web address]. Please follow the instruction sheet given to you today for more information.   Follow-Up: At [facility], you and your health needs are our priority.  As part of our continuing mission to provide you with exceptional heart care, our providers are all part of one team.  This team includes your primary Cardiologist (physician) and Advanced Practice Providers or APPs (Physician Assistants and Nurse Practitioners) who all work together to provide you with the care you need, when you need it.  Your next appointment:   6 MONTHS  Provider:   Lynwood Schilling, MD OR Lum Louis, NP

## 2024-11-02 NOTE — Progress Notes (Signed)
 " Cardiology Office Note:    Date:  11/02/2024  ID:  Felicia Good, DOB [birth year], MRN [id number] PCP: Gayle Saddie [name]  [name] Providers Cardiologist:  Lynwood Schilling, MD Cardiology APP:  Rana Lum CROME, NP       Patient Profile:       Chief Complaint: 50-month follow-up History of Present Illness:  Felicia Good is a 64 y.o. female with visit-pertinent history of ischemic cardiomyopathy, HFrEF, and mild mitral regurgitation, hyperlipidemia   She established with cardiology service on 09/09/2023 for evaluation of shortness of breath.  The shortness of breath started about a year and a half ago, she would notice it while walking up small [place] near her home.  Echocardiogram September 2024 showed LVEF 35-40%, global hypokinesis, mild MR and mild anterior leaflet prolapse.  Her cardiomyopathy was unclear at that time.  Her lisinopril  was discontinued and she was started on Entresto  24/26 mg twice daily.  Coronary CTA was ordered for ischemic evaluation and completed on 09/27/2023 showing coronary calcium  score of 0 and total plaque volume of 0 with no evidence of CAD.  She was seen in clinic on 10/28/2023 and doing well at the time.  She was started on carvedilol  3.125 mg twice daily.   She was seen in clinic on 12/14/2023. She reports feeling tired and worn out and attributes this to recent stress and personal circumstances.  GDMT titration was limited due to low blood pressure of 94/64.  Echocardiogram was repeated on 01/06/2024 showing LVEF 40-45%, LV with mildly decreased function, no RWMA, normal diastolic parameters, RV function and size normal, trivial MR, mild calcification of aortic valve.   Last seen in clinic on 04/10/2024.  She remained euvolemic well compensated.  Further GDMT optimization was limited due to low normal BP.  She was to follow-up in 6 months.  Amyloid study with PAP shows that findings are not suggestive of cardiac ATTR amyloidosis.  The myocardium was  negative for radiotracer uptake.   Discussed the use of AI scribe software for clinical note transcription with the patient, who gave verbal consent to proceed.  History of Present Illness Felicia Good is a 64 year old female who presents for 81-month follow-up.  She feels overall well and paces activity to limit fatigue and dyspnea. She develops mild shortness of breath and with long-distance walking or stairs, which improves with rest.  She denies any chest pains, orthopnea, PND, LEE.  She denies any weight gain.  She is taking carvedilol , Jardiance , and Entresto .  Her family history is notable for heart failure in her father, mother, and uncle, with possible heart disease in her maternal grandfather.  She smokes, making a pack last four days, and does not drink alcohol.  She is without syncope, presyncope, lightheadedness, dizziness   Review of systems:  Please see the history of present illness. All other systems are reviewed and otherwise negative.      Studies Reviewed:        Amyloid study with PYP 05/31/2024   Findings are not suggestive of cardiac ATTR amyloidosis. The myocardium was negative for radiotracer uptake.   The visual grade of myocardial uptake relative to the ribs was Grade 0 (No myocardial uptake and normal bone uptake).   CT images were obtained for attenuation correction and were examined for the presence of coronary calcium  when appropriate.  Echocardiogram 01/06/2024 1. Left ventricular ejection fraction, by estimation, is 40 to 45%. The  left ventricle has mildly decreased  function. The left ventricle has no  regional wall motion abnormalities. Left ventricular diastolic parameters  were normal.   2. Right ventricular systolic function is normal. The right ventricular  size is normal.   3. The mitral valve is normal in structure. Trivial mitral valve  regurgitation. No evidence of mitral stenosis.   4. The aortic valve is tricuspid. There is mild  calcification of the  aortic valve. Aortic valve regurgitation is not visualized. No aortic  stenosis is present.   5. The inferior vena cava is normal in size with greater than 50%  respiratory variability, suggesting right atrial pressure of 3 mmHg.    Coronary CTA 09/27/2023 1. Coronary calcium  score of 0. This was 0 percentile for age-, sex, and race-matched controls.   2. Total plaque volume 0 mm3 which is 0 percentile for age- and sex-matched controls (calcified plaque 0 mm3; non-calcified plaque 0 mm3). TPV is 0.   3. Normal coronary origin with right dominance.   4. Normal coronary arteries.  Risk Assessment/Calculations:              Physical Exam:   VS:  BP 102/62 (BP Location: Left Arm, Patient Position: Sitting, Cuff Size: Normal)   Pulse 67   Ht 5' 7 (1.702 m)   Wt 151 lb (68.5 kg)   LMP 11/26/2013   BMI 23.65 kg/m    Wt Readings from Last 3 Encounters:  11/02/24 151 lb (68.5 kg)  10/09/24 153 lb 0.6 oz (69.4 kg)  06/06/24 148 lb 6 oz (67.3 kg)    GEN: Well nourished, well developed in no acute distress NECK: No JVD; No carotid bruits CARDIAC: RRR, no murmurs, rubs, gallops RESPIRATORY:  Clear to auscultation without rales, wheezing or rhonchi  ABDOMEN: Soft, non-tender, non-distended EXTREMITIES:  No edema; No acute deformity      Assessment and Plan:  HFmrEF Nonischemic Cardiomyopathy The etiology of this is not clear at this time Echocardiogram 06/2023 w/ LVEF 35-40%, global hypokinesis Coronary CTA 09/2023 with normal coronary arteries and CAC of 0 Echocardiogram 12/2023 w/ LVEF 40-45%, LV mildly decreased function Amyloid PYP study was not suggestive of cardiac ATTR amyloidosis Multiple myeloma panel 01/2024 was unremarkable - Today she is euvolemic and well compensated on exam.  NYHA class II symptoms with mild dyspnea on exertion.  No orthopnea, PND, LEE - GDMT optimization limited given low normal BP - Continue Carvedilol  3.125 mg twice  daily - Continue Entresto  24-26 mg twice daily - Continue Jardiance  10 mg daily - Plan for cardiac MRI for further assessment of cardiomyopathy   Mitral valve prolapse Echocardiogram 06/2023 with mild prolapse of anterior leaflet  Repeat echocardiogram shows the mitral valve is normal in structure with no evidence of MVP - Will reevaluate with cardiac MRI as noted above    Hyperlipidemia LDL 98 on 07/2024 and controlled  - Continue atorvastatin  20 mg daily   Tobacco use  Long-time smoker, smoking a pack of cigarettes every 4 days - Action stage currently. - Encouraged continued efforts to quit smoking      Dispo:  Return in about 6 months (around 05/02/2025).  Signed, Lum LITTIE Louis, NP  "

## 2024-11-13 ENCOUNTER — Encounter (HOSPITAL_COMMUNITY): Payer: Self-pay

## 2024-11-14 ENCOUNTER — Other Ambulatory Visit (HOSPITAL_COMMUNITY): Payer: Self-pay

## 2024-11-14 ENCOUNTER — Other Ambulatory Visit: Payer: Self-pay | Admitting: Emergency Medicine

## 2024-11-14 ENCOUNTER — Other Ambulatory Visit: Payer: Self-pay | Admitting: Cardiology

## 2024-11-14 ENCOUNTER — Ambulatory Visit (HOSPITAL_COMMUNITY)
Admission: RE | Admit: 2024-11-14 | Discharge: 2024-11-14 | Disposition: A | Discharge status: Home / Self Care | Source: Ambulatory Visit | Attending: Emergency Medicine | Admitting: Emergency Medicine

## 2024-11-14 DIAGNOSIS — I5020 Unspecified systolic (congestive) heart failure: Secondary | ICD-10-CM

## 2024-11-14 DIAGNOSIS — I428 Other cardiomyopathies: Secondary | ICD-10-CM

## 2024-11-14 MED ORDER — GADOBUTROL 1 MMOL/ML IV SOLN
10.0000 mL | Freq: Once | INTRAVENOUS | Status: AC | PRN
  Administered 2024-11-14: 10 mL via INTRAVENOUS

## 2024-11-16 ENCOUNTER — Ambulatory Visit: Payer: Self-pay | Admitting: Emergency Medicine

## 2024-11-16 ENCOUNTER — Telehealth: Payer: Self-pay | Admitting: Cardiology

## 2024-11-16 MED ORDER — SACUBITRIL-VALSARTAN 24-26 MG PO TABS
1.0000 | ORAL_TABLET | Freq: Two times a day (BID) | ORAL | 3 refills | Status: AC
  Filled 2024-11-16: qty 180, 90d supply, fill #0

## 2024-11-16 MED ORDER — CARVEDILOL 3.125 MG PO TABS
3.1250 mg | ORAL_TABLET | Freq: Two times a day (BID) | ORAL | 3 refills | Status: AC
  Filled 2024-11-16: qty 180, 90d supply, fill #0

## 2024-11-16 NOTE — Telephone Encounter (Signed)
" °*  STAT* If patient is at the pharmacy, call can be transferred to refill team.   1. Which medications need to be refilled? (please list name of each medication and dose if known)   carvedilol  (COREG ) 3.125 MG tablet      2. Would you like to learn more about the convenience, safety, & potential cost savings by using the [facility]? no     3. Are you open to using the [facility] (Type [facility]. no   4. Which pharmacy/location (including street and city if local pharmacy) is medication to be sent to?  [facility]    5. Do they need a 30 day or 90 day supply?  90 day   "

## 2024-11-16 NOTE — Telephone Encounter (Signed)
 Pt c/o medication issue:  1. Name of Medication: ENTRESTO  24-26 MG   2. How are you currently taking this medication (dosage and times per day)? As written  3. Are you having a reaction (difficulty breathing--STAT)? No   4. What is your medication issue? Pt states insurance will not cover Entresto , she is requesting the generic brand be send to  [facility] .

## 2024-11-16 NOTE — Telephone Encounter (Signed)
 Patient needs refill of generic Entresto  (sacubitril -valsartan ) and carvedilol  sent to [facility].  Refills sent to pharmacy. No further needs voiced at this time.

## 2024-11-17 ENCOUNTER — Other Ambulatory Visit: Payer: Self-pay

## 2024-11-20 ENCOUNTER — Other Ambulatory Visit (HOSPITAL_COMMUNITY): Payer: Self-pay

## 2024-11-20 ENCOUNTER — Other Ambulatory Visit: Payer: Self-pay

## 2024-11-21 ENCOUNTER — Other Ambulatory Visit: Payer: Self-pay

## 2024-11-21 NOTE — Telephone Encounter (Signed)
 Refill was sent 11/16/24.

## 2025-04-02 ENCOUNTER — Other Ambulatory Visit

## 2025-04-09 ENCOUNTER — Ambulatory Visit

## 2025-10-03 ENCOUNTER — Ambulatory Visit: Admitting: Dermatology
